# Patient Record
Sex: Male | Born: 1955 | Race: White | Hispanic: No | Smoking: Current every day smoker
Health system: Southern US, Community
[De-identification: ages and names within clinical notes are randomized; demographics above are authoritative.]

## PROBLEM LIST (undated history)

## (undated) DIAGNOSIS — I10 Essential (primary) hypertension: Secondary | ICD-10-CM

## (undated) DIAGNOSIS — J45909 Unspecified asthma, uncomplicated: Secondary | ICD-10-CM

## (undated) DIAGNOSIS — E119 Type 2 diabetes mellitus without complications: Secondary | ICD-10-CM

---

## 1999-05-19 ENCOUNTER — Ambulatory Visit (HOSPITAL_BASED_OUTPATIENT_CLINIC_OR_DEPARTMENT_OTHER): Admission: RE | Admit: 1999-05-19 | Discharge: 1999-05-19 | Payer: Self-pay | Admitting: Orthopedic Surgery

## 2001-04-24 ENCOUNTER — Encounter: Admission: RE | Admit: 2001-04-24 | Discharge: 2001-04-24 | Payer: Self-pay | Admitting: Orthopedic Surgery

## 2001-04-24 ENCOUNTER — Encounter: Payer: Self-pay | Admitting: Orthopedic Surgery

## 2001-05-08 ENCOUNTER — Encounter: Payer: Self-pay | Admitting: Orthopedic Surgery

## 2001-05-08 ENCOUNTER — Encounter: Admission: RE | Admit: 2001-05-08 | Discharge: 2001-05-08 | Payer: Self-pay | Admitting: Orthopedic Surgery

## 2001-06-17 ENCOUNTER — Emergency Department (HOSPITAL_COMMUNITY): Admission: EM | Admit: 2001-06-17 | Discharge: 2001-06-17 | Payer: Self-pay | Admitting: Emergency Medicine

## 2001-06-17 ENCOUNTER — Encounter: Payer: Self-pay | Admitting: Emergency Medicine

## 2001-07-04 ENCOUNTER — Emergency Department (HOSPITAL_COMMUNITY): Admission: EM | Admit: 2001-07-04 | Discharge: 2001-07-04 | Payer: Self-pay | Admitting: Emergency Medicine

## 2003-07-13 ENCOUNTER — Emergency Department (HOSPITAL_COMMUNITY): Admission: EM | Admit: 2003-07-13 | Discharge: 2003-07-13 | Payer: Self-pay | Admitting: Emergency Medicine

## 2003-07-13 ENCOUNTER — Encounter: Payer: Self-pay | Admitting: Emergency Medicine

## 2004-08-04 ENCOUNTER — Inpatient Hospital Stay (HOSPITAL_COMMUNITY): Admission: EM | Admit: 2004-08-04 | Discharge: 2004-08-04 | Payer: Self-pay | Admitting: Emergency Medicine

## 2004-10-28 ENCOUNTER — Emergency Department (HOSPITAL_COMMUNITY): Admission: EM | Admit: 2004-10-28 | Discharge: 2004-10-28 | Payer: Self-pay | Admitting: Emergency Medicine

## 2006-03-04 ENCOUNTER — Emergency Department (HOSPITAL_COMMUNITY): Admission: EM | Admit: 2006-03-04 | Discharge: 2006-03-04 | Payer: Self-pay | Admitting: Emergency Medicine

## 2010-05-14 ENCOUNTER — Encounter: Admission: RE | Admit: 2010-05-14 | Discharge: 2010-05-14 | Payer: Self-pay | Admitting: General Surgery

## 2010-05-18 ENCOUNTER — Ambulatory Visit (HOSPITAL_BASED_OUTPATIENT_CLINIC_OR_DEPARTMENT_OTHER): Admission: RE | Admit: 2010-05-18 | Discharge: 2010-05-18 | Payer: Self-pay | Admitting: General Surgery

## 2010-12-10 LAB — BASIC METABOLIC PANEL
BUN: 12 mg/dL (ref 6–23)
CO2: 27 mEq/L (ref 19–32)
Chloride: 102 mEq/L (ref 96–112)
Potassium: 4.4 mEq/L (ref 3.5–5.1)

## 2010-12-10 LAB — GLUCOSE, CAPILLARY: Glucose-Capillary: 272 mg/dL — ABNORMAL HIGH (ref 70–99)

## 2010-12-10 LAB — CBC
HCT: 49.3 % (ref 39.0–52.0)
MCH: 31.4 pg (ref 26.0–34.0)
MCV: 91.1 fL (ref 78.0–100.0)
RBC: 5.41 MIL/uL (ref 4.22–5.81)
RDW: 13 % (ref 11.5–15.5)
WBC: 7.3 10*3/uL (ref 4.0–10.5)

## 2010-12-10 LAB — DIFFERENTIAL
Eosinophils Relative: 2 % (ref 0–5)
Lymphocytes Relative: 23 % (ref 12–46)
Lymphs Abs: 1.7 10*3/uL (ref 0.7–4.0)
Monocytes Absolute: 1 10*3/uL (ref 0.1–1.0)

## 2011-02-12 NOTE — H&P (Signed)
Justin Snow, Justin Snow            ACCOUNT NO.:  [id number]   MEDICAL RECORD NO.:  [id number]          PATIENT TYPE:  EMS   LOCATION:  ED                           FACILITY:  [facility]   PHYSICIAN:  Danae Chen, M.D.DATE OF BIRTH:  [birth year]   DATE OF ADMISSION:  08/04/2004  DATE OF DISCHARGE:                                HISTORY & PHYSICAL   The patient is a primary unassigned patient.   CHIEF COMPLAINT:  Chest discomfort.   HISTORY OF PRESENT ILLNESS:  The patient is a 55 year old white male who  presents to the emergency room with a two-day history of chest discomfort  which he said he began at rest at home. It was not associated with nausea,  vomiting, or diaphoresis, but he was short of breath at the time. The pain  was dull in character and did radiate across his chest to his back, but not  to his right arm. It was not worsened with activity that he is aware of. He  has had some similar episodes in the past, but they have been associated  with bouts of bronchitis and cough. Of course this was different than  before. He has no significant past medical history, although he reports he  has had hypertension in the past, but has not been treated.  His primary  problem is low back pain secondary to degenerative disk disease of his  lumbar spine. 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   PAST SURGICAL HISTORY:  Tonsillectomy.   MEDICATIONS:  Except for over-the-counter ibuprofen.   ALLERGIES:  No known drug allergies.   SOCIAL HISTORY:  He is divorced, has three children. He is an 80-pack-year  smoker. He has used alcohol in the past, but denies current alcohol use.   FAMILY HISTORY:  He denies any family history of early onset coronary artery  disease. Both father and grandfather lived to their 47s. No history of  diabetes that he is aware of.   OTHER PERTINENT CARDIAC RISK FACTORS:   He does not have a history of high  cholesterol to his knowledge or diabetes. As reported he is not being  currently treated for hypertension. He does not exercise on a regular basis.   REVIEW OF SYSTEMS:  Positive for chest discomfort and shortness of breath.  Negative for nausea, vomiting, diuresis.  Negative for hemoptysis,  hematochezia, or melena. No recent weight gain or weight loss. Positive for  headache and sinus pressure.   PHYSICAL EXAMINATION:  VITAL SIGNS: Afebrile, temperature 96.9, blood  pressure 165/105, pulse 88, respirations 18. O2 saturation 99% on room air.  GENERAL: He is in no acute distress, resting comfortably in his bed.  HEENT: Pupils equal and reactive. Head is normocephalic and atraumatic.  Oropharynx is clear. He does have some mild sinus tenderness to palpation  over his frontal and maxillary sinus.  NECK: Supple with no JVD or bruits appreciated.  LUNGS: Clear to auscultation bilaterally.  HEART: Rate is regular. No murmurs.  ABDOMEN: Slightly obese, soft,  and nontender. No rebound or guarding.  EXTREMITIES: He has no peripheral edema.  NEUROLOGIC: No focal neurologic deficits. Alert and oriented.   LABORATORY DATA:  White count 8500, hemoglobin 15.7, platelet count 173,000.  Sodium 134, potassium 3.8, chloride 99, CO2 28, glucose 219, BUN 8,  creatinine 1.1, calcium 8.5, total protein 6.5, albumin 3.5.  AST 25, ALT  27, alkaline phosphatase 125, total bilirubin 0.6. Initial CK-MB is 55.3 and  1.1, troponin less than 0.05.   Chest x-ray shows no active cardiopulmonary disease, minimal chronic  bronchitic changes. EKG shows a normal sinus rhythm with normal axis, no ST  segment elevation or depression.   IMPRESSION:  A 55 year old gentleman with cardiac risk factors that include  his age, tobacco use, and physical inactivity. He presents with atypical  chest discomfort, but given his risk factors it would be advisable to admit  him to telemetry and  rule him out with serial cardiac enzymes. In addition,  his glucose level of 219 suggests that he may  have glucose intolerance and  we will check hemoglobin A1C as well for him. At this time will start him on  a beta blocker, ACE inhibitor, and aspirin with Protonix for GI prophylaxis,  IV morphine for pain, and Allegra D for his sinus discomfort. Will consult  cardiology for probable stress EKG test in the a.m. As the patient is  currently pain free we can discontinue the nitroglycerin and I will follow  up his serial enzymes.      RLK/MEDQ  D:  08/04/2004  T:  08/04/2004  Job:  [id number]

## 2019-10-18 ENCOUNTER — Ambulatory Visit (INDEPENDENT_AMBULATORY_CARE_PROVIDER_SITE_OTHER): Payer: Medicare Other

## 2019-10-18 ENCOUNTER — Other Ambulatory Visit: Payer: Self-pay

## 2019-10-18 ENCOUNTER — Encounter (HOSPITAL_COMMUNITY): Payer: Self-pay

## 2019-10-18 ENCOUNTER — Ambulatory Visit (HOSPITAL_COMMUNITY)
Admission: EM | Admit: 2019-10-18 | Discharge: 2019-10-18 | Disposition: A | Discharge status: Home / Self Care | Payer: Medicare Other | Attending: Family Medicine | Admitting: Family Medicine

## 2019-10-18 DIAGNOSIS — Z8249 Family history of ischemic heart disease and other diseases of the circulatory system: Secondary | ICD-10-CM | POA: Insufficient documentation

## 2019-10-18 DIAGNOSIS — E0865 Diabetes mellitus due to underlying condition with hyperglycemia: Secondary | ICD-10-CM | POA: Insufficient documentation

## 2019-10-18 DIAGNOSIS — R0602 Shortness of breath: Secondary | ICD-10-CM | POA: Insufficient documentation

## 2019-10-18 DIAGNOSIS — R600 Localized edema: Secondary | ICD-10-CM | POA: Diagnosis not present

## 2019-10-18 DIAGNOSIS — F17210 Nicotine dependence, cigarettes, uncomplicated: Secondary | ICD-10-CM | POA: Insufficient documentation

## 2019-10-18 DIAGNOSIS — R030 Elevated blood-pressure reading, without diagnosis of hypertension: Secondary | ICD-10-CM | POA: Insufficient documentation

## 2019-10-18 DIAGNOSIS — E1165 Type 2 diabetes mellitus with hyperglycemia: Secondary | ICD-10-CM

## 2019-10-18 DIAGNOSIS — Z20822 Contact with and (suspected) exposure to covid-19: Secondary | ICD-10-CM | POA: Diagnosis not present

## 2019-10-18 DIAGNOSIS — Z794 Long term (current) use of insulin: Secondary | ICD-10-CM | POA: Insufficient documentation

## 2019-10-18 DIAGNOSIS — J45909 Unspecified asthma, uncomplicated: Secondary | ICD-10-CM | POA: Insufficient documentation

## 2019-10-18 HISTORY — DX: Unspecified asthma, uncomplicated: J45.909

## 2019-10-18 HISTORY — DX: Type 2 diabetes mellitus without complications: E11.9

## 2019-10-18 LAB — CBG MONITORING, ED: Glucose-Capillary: 279 mg/dL — ABNORMAL HIGH (ref 70–99)

## 2019-10-18 LAB — BASIC METABOLIC PANEL
Anion gap: 11 (ref 5–15)
BUN: 20 mg/dL (ref 8–23)
CO2: 22 mmol/L (ref 22–32)
Calcium: 9 mg/dL (ref 8.9–10.3)
Chloride: 108 mmol/L (ref 98–111)
Creatinine, Ser: 1.23 mg/dL (ref 0.61–1.24)
GFR calc Af Amer: >60 mL/min (ref >60)
GFR calc non Af Amer: >60 mL/min (ref >60)
Glucose, Bld: 305 mg/dL — ABNORMAL HIGH (ref 70–99)
Potassium: 3.3 mmol/L — ABNORMAL LOW (ref 3.5–5.1)
Sodium: 141 mmol/L (ref 135–145)

## 2019-10-18 LAB — GLUCOSE, CAPILLARY: Glucose-Capillary: 279 mg/dL — ABNORMAL HIGH (ref 70–99)

## 2019-10-18 MED ORDER — "INSULIN SYRINGE 30G X 1/2"" 0.5 ML MISC": 0 refills | Status: AC

## 2019-10-18 MED ORDER — FUROSEMIDE 40 MG PO TABS: 40.0000 mg | ORAL_TABLET | Freq: Every day | ORAL | 0 refills | Status: DC

## 2019-10-18 NOTE — ED Provider Notes (Signed)
[facility]   [id number] 10/18/19 Arrival Time: 1050  ASSESSMENT & PLAN:  1. SOB (shortness of breath)   2. Bilateral lower extremity edema   3. 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 Elevated blood pressure reading without diagnosis of hypertension     Meds ordered this encounter  Medications  . Insulin Syringe-Needle U-100 (INSULIN SYRINGE .5CC/30GX1/2") 30G X 1/2" 0.5 ML MISC    Sig: Use as directed with insulin.    Dispense:  100 each    Refill:  0  . furosemide (LASIX) 40 MG tablet    Sig: Take 1 tablet (40 mg total) by mouth daily.    Dispense:  7 tablet    Refill:  0    Suspect CHF. Stressed importance of establishing care with PCP. He would like trial of Lasix and outpt management but agrees to proceed to the ED should his symptoms worsen in any way. Is currently living with relatives who can drive him as needed; 315 if acute worsening. Discussed my worry over persistent tachypnea. He voices understanding and wishes to proceed with current plan. Blood sugar elevated; will start back on insulin when he gets syringes.  BMP pending. Will notify of any significant abnormalities.   Follow-up Information    Schedule an appointment as soon as possible for a visit  with [facility].   Contact information: [address] [place] [address] [phone]       Schedule an appointment as soon as possible for a visit  with Primary Care at [facility].   Specialty: Family Medicine Contact information: [address] [place] [address] [phone]       Schedule an appointment as soon as possible for a visit  with [facility].   Contact information: [address] [place] [address] [phone]            Discharge Instructions     You have been seen at  the [facility] today for shortness of breath and some fluid on your lungs. Begin taking the fluid pill prescribed (Lasix).  Your evaluation today was not suggestive of any emergent condition requiring hospitalization at this time. However, some medical problems make take more time to appear. Therefore, it's very important that you pay attention to any new symptoms or worsening of your current condition.  Please proceed directly to the Emergency Department immediately should you feel worse in any way.     Reviewed expectations re: course of current medical issues. Questions answered. Outlined signs and symptoms indicating need for more acute intervention. Patient verbalized understanding. After Visit Summary given.   SUBJECTIVE: History from: patient. Justin Snow is a 64 y.o. diabetic male who presents with worsening SOB x7 days. Takes albuterol inhaler for relief and takes it more frequently now. He's been baseline SOB for 1 year; worse with exertion.  History of diabetes and lower extremity edema. Hasn't had insulin syringes and so has not been taking insulin x7 days. Has not been taking lasix as previously prescribed. Endorses slight pleuritic chest pain, intermittent non-productive cough, fatigue, DOE, and lightheadedness; all sporadic except for persistent fatigue. Denies recent sick contacts, URI symptoms, fever/chills, or abdominal pain. Pt does smoke cigarettes daily and has for 40+yrs. 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 No PCP; has been incarcerated until recently. No current CP  reported. Symptoms are worse with exertion.  Increased blood pressure noted today. Reports that he has not been treated for hypertension in the past. He reports no chest pain on exertion, no orthostatic dizziness or lightheadedness, no palpitations and no intermittent claudication symptoms.   OBJECTIVE:  Vitals:   10/18/19 1135  BP: (!) 178/85  Pulse: 83  Resp: (!) 26    Temp: 97.8 F (36.6 C)  TempSrc: Oral  SpO2: 96%    General appearance: alert; appears fatigued Eyes: PERRLA; EOMI; conjunctiva normal HENT: normocephalic; atraumatic; oral mucosa normal Neck: supple with FROM Lungs: labored breathing, tachypneic,overall lungs clear to auscultation bilaterally without wheezing but question slight rales at bases Heart: regular rate and rhythm Abdomen: soft, non-tender Back: no CVA tenderness Extremities: 2+ bilateral pitting lower extremity edema Skin: warm and dry Neurologic: normal gait Psychological: alert and cooperative; normal mood and affect  Labs: Results for orders placed or performed during the hospital encounter of 10/18/19  Glucose, capillary  Result Value Ref Range   Glucose-Capillary 279 (H) 70 - 99 mg/dL  CBG monitoring, ED  Result Value Ref Range   Glucose-Capillary 279 (H) 70 - 99 mg/dL   Labs Reviewed  GLUCOSE, CAPILLARY - Abnormal; Notable for the following components:      Result Value   Glucose-Capillary 279 (*)    All other components within normal limits  CBG MONITORING, ED - Abnormal; Notable for the following components:   Glucose-Capillary 279 (*)    All other components within normal limits  NOVEL CORONAVIRUS, NAA (HOSP ORDER, SEND-OUT TO REF LAB; TAT 18-24 HRS)  CBG MONITORING, ED    Imaging: DG Chest 2 View  Result Date: 10/18/2019 CLINICAL DATA:  Shortness of breath. Wheezing. EXAM: CHEST - 2 VIEW COMPARISON:  05/14/2010 FINDINGS: The patient has new bilateral mild interstitial pulmonary edema with Kerley B-lines at the right lung base. Heart size and pulmonary vascularity are normal. Minimal blunting of the posterior costophrenic angles suggesting tiny effusions. No acute bone abnormality. IMPRESSION: New mild bilateral interstitial pulmonary edema. Probable tiny bilateral effusions. Electronically Signed   By: Lorriane Shire M.D.   On: 10/18/2019 12:23    NKDA reported.  Past Medical History:  Diagnosis  Date  . Asthma   . Diabetes mellitus without complication [facility])    Social History   Socioeconomic History  . Marital status: Single    Spouse name: Not on file  . Number of children: Not on file  . Years of education: Not on file  . Highest education level: Not on file  Occupational History  . Not on file  Tobacco Use  . Smoking status: Current Every Day Smoker    Packs/day: 0.50    Types: Cigarettes  . Smokeless tobacco: Never Used  Substance and Sexual Activity  . Alcohol use: Not Currently  . Drug use: Not Currently  . Sexual activity: Not on file  Other Topics Concern  . Not on file  Social History Narrative  . Not on file   Social Determinants of Health   Financial Resource Strain:   . Difficulty of Paying Living Expenses: Not on file  Food Insecurity:   . Worried About Charity fundraiser in the Last Year: Not on file  . Ran Out of Food in the Last Year: Not on file  Transportation Needs:   . Lack of Transportation (Medical): Not on file  . Lack of Transportation (Non-Medical): Not on file  Physical Activity:   . Days of Exercise  per Week: Not on file  . Minutes of Exercise per Session: Not on file  Stress:   . Feeling of Stress : Not on file  Social Connections:   . Frequency of Communication with Friends and Family: Not on file  . Frequency of Social Gatherings with Friends and Family: Not on file  . Attends Religious Services: Not on file  . Active Member of Clubs or Organizations: Not on file  . Attends Banker Meetings: Not on file  . Marital Status: Not on file  Intimate Partner Violence:   . Fear of Current or Ex-Partner: Not on file  . Emotionally Abused: Not on file  . Physically Abused: Not on file  . Sexually Abused: Not on file   FH: HTN  History reviewed. No pertinent surgical history.   Mardella Layman, MD 10/18/19 1331

## 2019-10-18 NOTE — ED Notes (Signed)
Bed: UC08 Expected date:  Expected time:  Means of arrival:  Comments: [place]

## 2019-10-18 NOTE — ED Triage Notes (Signed)
Pt c/o SOB increased this past week from baseline. Released from incarceration last Wednesday and only has inhalers that he states are not helping his SOB well enough. Also states he was unable to get insulin syringes for his Rx since release.  CBG 279 Also c/o intermittent CP that increases with movement, breathing and when SOB; mild dizziness. Denies diaphoresis, n/v.   SaO2 89% upon first arrival to room, once rested increased to 98-100%.  Also c/o HA, cough.. SOB noted with talking.

## 2019-10-18 NOTE — Discharge Instructions (Addendum)
You have been seen at the [facility] today for shortness of breath and some fluid on your lungs. Begin taking the fluid pill prescribed (Lasix). It is very important that you pay attention to any new symptoms or worsening of your current condition.  Please proceed directly to the Emergency Department immediately should you feel worse in any way.

## 2019-10-20 ENCOUNTER — Other Ambulatory Visit: Payer: Self-pay

## 2019-10-20 ENCOUNTER — Encounter (HOSPITAL_COMMUNITY): Payer: Self-pay

## 2019-10-20 ENCOUNTER — Emergency Department (HOSPITAL_COMMUNITY)
Admission: EM | Admit: 2019-10-20 | Discharge: 2019-10-20 | Discharge status: Against Medical Advice | Payer: Medicare Other | Attending: Emergency Medicine | Admitting: Emergency Medicine

## 2019-10-20 ENCOUNTER — Ambulatory Visit (INDEPENDENT_AMBULATORY_CARE_PROVIDER_SITE_OTHER)
Admission: EM | Admit: 2019-10-20 | Discharge: 2019-10-20 | Disposition: A | Discharge status: Nursing Home (Includes ICF) | Payer: Medicare Other | Source: Home / Self Care

## 2019-10-20 ENCOUNTER — Emergency Department (HOSPITAL_COMMUNITY): Payer: Medicare Other

## 2019-10-20 DIAGNOSIS — R0902 Hypoxemia: Secondary | ICD-10-CM | POA: Diagnosis not present

## 2019-10-20 DIAGNOSIS — Z5320 Procedure and treatment not carried out because of patient's decision for unspecified reasons: Secondary | ICD-10-CM | POA: Insufficient documentation

## 2019-10-20 DIAGNOSIS — J449 Chronic obstructive pulmonary disease, unspecified: Secondary | ICD-10-CM

## 2019-10-20 DIAGNOSIS — E119 Type 2 diabetes mellitus without complications: Secondary | ICD-10-CM | POA: Insufficient documentation

## 2019-10-20 DIAGNOSIS — R600 Localized edema: Secondary | ICD-10-CM | POA: Diagnosis not present

## 2019-10-20 DIAGNOSIS — F17200 Nicotine dependence, unspecified, uncomplicated: Secondary | ICD-10-CM

## 2019-10-20 DIAGNOSIS — F17210 Nicotine dependence, cigarettes, uncomplicated: Secondary | ICD-10-CM | POA: Diagnosis not present

## 2019-10-20 DIAGNOSIS — R0602 Shortness of breath: Secondary | ICD-10-CM | POA: Diagnosis present

## 2019-10-20 DIAGNOSIS — Z8709 Personal history of other diseases of the respiratory system: Secondary | ICD-10-CM | POA: Insufficient documentation

## 2019-10-20 LAB — NOVEL CORONAVIRUS, NAA (HOSP ORDER, SEND-OUT TO REF LAB; TAT 18-24 HRS): SARS-CoV-2, NAA: NOT DETECTED

## 2019-10-20 LAB — BASIC METABOLIC PANEL
Anion gap: 10 (ref 5–15)
BUN: 23 mg/dL (ref 8–23)
CO2: 24 mmol/L (ref 22–32)
Calcium: 9 mg/dL (ref 8.9–10.3)
Chloride: 107 mmol/L (ref 98–111)
Creatinine, Ser: 1.28 mg/dL — ABNORMAL HIGH (ref 0.61–1.24)
GFR calc Af Amer: >60 mL/min (ref >60)
GFR calc non Af Amer: 58 mL/min — ABNORMAL LOW (ref >60)
Glucose, Bld: 257 mg/dL — ABNORMAL HIGH (ref 70–99)
Potassium: 3.5 mmol/L (ref 3.5–5.1)
Sodium: 141 mmol/L (ref 135–145)

## 2019-10-20 LAB — CBC WITH DIFFERENTIAL/PLATELET
Abs Immature Granulocytes: 0.07 10*3/uL (ref 0.00–0.07)
Basophils Absolute: 0.1 10*3/uL (ref 0.0–0.1)
Basophils Relative: 1 %
Eosinophils Absolute: 0.4 10*3/uL (ref 0.0–0.5)
Eosinophils Relative: 4 %
HCT: 38.1 % — ABNORMAL LOW (ref 39.0–52.0)
Hemoglobin: 12.3 g/dL — ABNORMAL LOW (ref 13.0–17.0)
Immature Granulocytes: 1 %
Lymphocytes Relative: 17 %
Lymphs Abs: 1.8 10*3/uL (ref 0.7–4.0)
MCH: 28.9 pg (ref 26.0–34.0)
MCHC: 32.3 g/dL (ref 30.0–36.0)
MCV: 89.6 fL (ref 80.0–100.0)
Monocytes Absolute: 0.9 10*3/uL (ref 0.1–1.0)
Monocytes Relative: 9 %
Neutro Abs: 7.1 10*3/uL (ref 1.7–7.7)
Neutrophils Relative %: 68 %
Platelets: 181 10*3/uL (ref 150–400)
RBC: 4.25 MIL/uL (ref 4.22–5.81)
RDW: 13.4 % (ref 11.5–15.5)
WBC: 10.4 10*3/uL (ref 4.0–10.5)
nRBC: 0 % (ref 0.0–0.2)

## 2019-10-20 LAB — BRAIN NATRIURETIC PEPTIDE: B Natriuretic Peptide: 94.5 pg/mL (ref 0.0–100.0)

## 2019-10-20 MED ORDER — ACETAMINOPHEN-CODEINE #3 300-30 MG PO TABS: 2.0000 | ORAL_TABLET | Freq: Once | ORAL | Status: DC

## 2019-10-20 MED ORDER — ALBUTEROL SULFATE HFA 108 (90 BASE) MCG/ACT IN AERS
8.0000 | INHALATION_SPRAY | Freq: Once | RESPIRATORY_TRACT | Status: AC
  Administered 2019-10-20: 8 via RESPIRATORY_TRACT
  Filled 2019-10-20: qty 6.7

## 2019-10-20 MED ORDER — AMOXICILLIN 500 MG PO CAPS: 500.0000 mg | ORAL_CAPSULE | Freq: Two times a day (BID) | ORAL | 0 refills | Status: AC

## 2019-10-20 MED ORDER — ACETAMINOPHEN 325 MG PO TABS
650.0000 mg | ORAL_TABLET | Freq: Once | ORAL | Status: AC
  Administered 2019-10-20: 650 mg via ORAL
  Filled 2019-10-20: qty 2

## 2019-10-20 MED ORDER — AZITHROMYCIN 250 MG PO TABS: 250.0000 mg | ORAL_TABLET | Freq: Every day | ORAL | 0 refills | Status: DC

## 2019-10-20 MED ORDER — IPRATROPIUM BROMIDE HFA 17 MCG/ACT IN AERS
4.0000 | INHALATION_SPRAY | Freq: Once | RESPIRATORY_TRACT | Status: AC
Start: 2019-10-20 — End: 2019-10-20
  Administered 2019-10-20: 4 via RESPIRATORY_TRACT
  Filled 2019-10-20: qty 12.9

## 2019-10-20 MED ORDER — AEROCHAMBER PLUS FLO-VU MEDIUM MISC
1.0000 | Freq: Once | Status: DC
  Filled 2019-10-20: qty 1

## 2019-10-20 MED ORDER — METHYLPREDNISOLONE SODIUM SUCC 125 MG IJ SOLR
125.0000 mg | Freq: Once | INTRAMUSCULAR | Status: AC
  Administered 2019-10-20: 14:00:00 125 mg via INTRAVENOUS
  Filled 2019-10-20: qty 2

## 2019-10-20 NOTE — ED Provider Notes (Signed)
[place]    CSN: [id number] Arrival date & time: 10/20/19  1001      History   Chief Complaint Chief Complaint  Patient presents with  . Shortness of Breath    HPI Justin Snow is a 64 y.o. male.   HPI  Patient presents to urgent care for shortness of breath. He is tachypneic and oxygen saturation 86%. Oxygen saturation improved with 2 liters of oxygen to 93%. Patient seen here two days ago with the same complaint and chest x-ray noted the presence pulmonary edema. Patient denies known CHF. He was placed on Lasix. Reports taken one dose of lasix. No significant improvement of shortness of breath. He also reports increased use of inhalers and now is completely out. He was incarcerated up until 1 week ago. COVID-19 test pending. He suffers from poorly controlled diabetes with hyperglycemia x 2 days ago per BMP.  Past Medical History:  Diagnosis Date  . Asthma   . Diabetes mellitus without complication ([place])     There are no problems to display for this patient.   History reviewed. No pertinent surgical history.     Home Medications    Prior to Admission medications   Medication Sig Start Date End Date Taking? Authorizing Provider  albuterol (VENTOLIN HFA) 108 (90 Base) MCG/ACT inhaler Inhale into the lungs every 6 (six) hours as needed for wheezing or shortness of breath.    [provider]  fluticasone-salmeterol (ADVAIR HFA) 115-21 MCG/ACT inhaler Inhale 2 puffs into the lungs 2 (two) times daily.    [provider]  furosemide (LASIX) 40 MG tablet Take 1 tablet (40 mg total) by mouth daily. 10/18/19   Vanessa Kick, MD  Insulin Syringe-Needle U-100 (INSULIN SYRINGE .5CC/30GX1/2") 30G X 1/2" 0.5 ML MISC Use as directed with insulin. 10/18/19   Vanessa Kick, MD    Family History History reviewed. No pertinent family history.  Social History Social History   Tobacco Use  . Smoking status: Current Every Day Smoker    Packs/day:  0.50    Types: Cigarettes  . Smokeless tobacco: Never Used  Substance Use Topics  . Alcohol use: Not Currently  . Drug use: Not Currently     Allergies   Patient has no allergy information on record.   Review of Systems Review of Systems Pertinent negatives listed in HPI  Physical Exam Triage Vital Signs ED Triage Vitals  Enc Vitals Group     BP 10/20/19 1015 (!) 154/127     Pulse Rate 10/20/19 1015 72     Resp 10/20/19 1015 (!) 28     Temp 10/20/19 1015 97.8 F (36.6 C)     Temp src --      SpO2 10/20/19 1015 (!) 86 %     Weight --      Height --      Head Circumference --      Peak Flow --      Pain Score 10/20/19 1018 0     Pain Loc --      Pain Edu? --      Excl. in [place]? --    No data found.  Updated Vital Signs BP (!) 154/127 (BP Location: Right Arm)   Pulse 72   Temp 97.8 F (36.6 C)   Resp (!) 28   SpO2 (!) 86%   Visual Acuity Right Eye Distance:   Left Eye Distance:   Bilateral Distance:    Right Eye Near:   Left  Eye Near:    Bilateral Near:     Physical Exam Constitutional:      General: He is in acute distress.     Appearance: He is obese. He is ill-appearing.  HENT:     Head: Normocephalic.  Cardiovascular:     Rate and Rhythm: Normal rate and regular rhythm.  Pulmonary:     Effort: Tachypnea, accessory muscle usage and respiratory distress present.     Breath sounds: Examination of the right-upper field reveals rales. Examination of the left-upper field reveals rales. Examination of the right-lower field reveals rales. Examination of the left-lower field reveals rales. Rales present.  Abdominal:     General: There is distension.  Skin:    General: Skin is dry.      UC Treatments / Results  Labs (all labs ordered are listed, but only abnormal results are displayed) Labs Reviewed - No data to display  EKG   Radiology DG Chest 2 View  Result Date: 10/18/2019 CLINICAL DATA:  Shortness of breath. Wheezing. EXAM: CHEST - 2  VIEW COMPARISON:  05/14/2010 FINDINGS: The patient has new bilateral mild interstitial pulmonary edema with Kerley B-lines at the right lung base. Heart size and pulmonary vascularity are normal. Minimal blunting of the posterior costophrenic angles suggesting tiny effusions. No acute bone abnormality. IMPRESSION: New mild bilateral interstitial pulmonary edema. Probable tiny bilateral effusions. Electronically Signed   By: Francene Boyers M.D.   On: 10/18/2019 12:23    Procedures Procedures (including critical care time)  Medications Ordered in UC Medications - No data to display  Initial Impression / Assessment and Plan / UC Course  I have reviewed the triage vital signs and the nursing notes.  Pertinent labs & imaging results that were available during my care of the patient were reviewed by me and considered in my medical decision making (see chart for details).   Patient transported via EMS to hospital due to acute hypoxia requiring oxygen supplementation.  Presentation concerning for possible underlying CHF vs COPD exacerbation, or COVID-19 infection.  Oxygen saturation stable now 93% with 2 liters of supplemental oxygen.  Final Clinical Impressions(s) / UC Diagnoses   Final diagnoses:  SOB (shortness of breath)  Hypoxia  Chronic obstructive pulmonary disease, unspecified COPD type [facility])   Discharge Instructions   None    ED Prescriptions    None     PDMP not reviewed this encounter.   Bing Neighbors, [place] 10/20/19 [phone]

## 2019-10-20 NOTE — ED Notes (Signed)
Gave pt meds and was evaluating SpO2. Pt on RA was currently at 88%, however would jump to 91% while sitting on the edge of the bed. Pt ambulated in room and SpO2 dropped again and stayed 87-88%. Once pt sat down and a few mins passed pt Sp)2 increased to 92% however did not stay there. Placed on 2 liters . Pt on 2 liters with 94% SpO2

## 2019-10-20 NOTE — ED Triage Notes (Signed)
Upon seeing pt name on reg board, this RN eval pt in waiting room with portable pulse ox. Resp rapid 28/min, shallow with noticeable SOB, SaO2 86% on RA. Jerrilyn Cairo NP notified of pt status and prior visit on Thursday for SOB. Jerrilyn Cairo advised pt to triage, O2 and call EMS for transport to ED.  Pt to triage O2 Sat up to 95% at rest but still tachypneic and placed on O2 via [place] at 2L and O2 Sat up 95%.   Jerrilyn Cairo NP in to triage for eval; advised pt need for EMS to ED 2/2 SOB.   18 ga SL placed L AC. Pt stable.

## 2019-10-20 NOTE — ED Triage Notes (Signed)
Pt arrives from urgent care via EMS with c/o SOB. Pt was seen on 10/18/19 and placed on lasix which pt states he takes. At Urgent care pt was 88% RA and placed on 2 liters with relief. When pt stood up SpO2 dropped back down and became SOB so EMS placed on 4 liters with SpO2 95% Generalized malaise. Was COVID tested on the 21st results pending at this time.   Pt current smoker CBG 347 174/90 HR 70 RR 28

## 2019-10-20 NOTE — Discharge Instructions (Signed)
Please read instructions below. Take the antibiotics as directed until they are gone. Use 1-2 puffs of the albuterol inhaler every 4-6 hours as needed. We recommended you stay in the hospital due to low oxygen levels, however you preferred to leave against medical advice. Please don't hesitate to return to the ER if your symptoms do not improve or worsen.

## 2019-10-20 NOTE — ED Notes (Signed)
EMS arrived for pt transport. Pt stable and transported via stretcher and O2

## 2019-10-20 NOTE — ED Notes (Signed)
Report called to ED charge nurse, Asher Muir. Affirmed understanding and expecting pt arrival.

## 2019-10-20 NOTE — ED Provider Notes (Signed)
[facility] EMERGENCY DEPARTMENT Provider Note   CSN: [id number] Arrival date & time: 10/20/19  1053     History Chief Complaint  Patient presents with  . Shortness of Breath    Justin Snow is a 64 y.o. male with past medical history of asthma, type 2 diabetes, presenting to the emergency department from urgent care with hypoxia.  Patient was evaluated 2 days ago at urgent care and discharged on Lasix with suspected COPD exacerbation versus new onset CHF.  He presented back to urgent care today because he states he ran out of his albuterol inhaler which was providing relief of symptoms.  He states his shortness of breath did not feel any worse than his usual shortness of breath that he treats with his albuterol.  He was noted to be hypoxic at 88% on room air at urgent care, and placed on 2 L nasal cannula.  On arrival to the ED, O2 saturation is 86% on room air, improved to 97% on 2 L.  He states he does not currently feel very short of breath on evaluation.  He reports a chronic cough as well as chronic bilateral lower extremity edema that is not worsening.  He states has been present for 3 to 4 years.  He denies orthopnea though reports some dyspnea on exertion.  He states he feels as though his chest is somewhat tight and he has a intermittent chronic cough.  No fevers or chills.  No known history of CHF.  No recent weight gain. Pt smokes tobacco daily. Per chart review, patient had Covid test on 10/18/2019 and is negative.  The history is provided by the patient and medical records.       Past Medical History:  Diagnosis Date  . Asthma   . Diabetes mellitus without complication ([place])     There are no problems to display for this patient.   No past surgical history on file.     No family history on file.  Social History   Tobacco Use  . Smoking status: Current Every Day Smoker    Packs/day: 0.50    Types: Cigarettes  . Smokeless tobacco: Never  Used  Substance Use Topics  . Alcohol use: Not Currently  . Drug use: Not Currently    Home Medications Prior to Admission medications   Medication Sig Start Date End Date Taking? Authorizing Provider  albuterol (VENTOLIN HFA) 108 (90 Base) MCG/ACT inhaler Inhale into the lungs every 6 (six) hours as needed for wheezing or shortness of breath.    [provider]  amoxicillin (AMOXIL) 500 MG capsule Take 1 capsule (500 mg total) by mouth 2 (two) times daily for 7 days. 10/20/19 10/27/19  , [place] N, PA-C  azithromycin (ZITHROMAX) 250 MG tablet Take 1 tablet (250 mg total) by mouth daily. Take first 2 tablets together, then 1 every day until finished. 10/20/19   , [place] N, PA-C  fluticasone-salmeterol (ADVAIR HFA) [phone] MCG/ACT inhaler Inhale 2 puffs into the lungs 2 (two) times daily.    [provider]  furosemide (LASIX) 40 MG tablet Take 1 tablet (40 mg total) by mouth daily. 10/18/19   Vanessa Kick, MD  Insulin Syringe-Needle U-100 (INSULIN SYRINGE .5CC/30GX1/2") 30G X 1/2" 0.5 ML MISC Use as directed with insulin. 10/18/19   Vanessa Kick, MD    Allergies    Patient has no allergy information on record.  Review of Systems   Review of Systems  All other systems reviewed  and are negative.   Physical Exam Updated Vital Signs BP (!) 125/98 (BP Location: Right Wrist)   Pulse 79   Resp 16   Ht 5\' 7"  (1.702 m)   Wt 136.1 kg   SpO2 92%   BMI 46.99 kg/m   Physical Exam Vitals and nursing note reviewed.  Constitutional:      Appearance: He is well-developed.  HENT:     Head: Normocephalic and atraumatic.  Eyes:     Conjunctiva/sclera: Conjunctivae normal.  Cardiovascular:     Rate and Rhythm: Normal rate and regular rhythm.  Pulmonary:     Effort: Tachypnea and accessory muscle usage present.     Comments: Faint wheezes b/l.  Abdominal:     Palpations: Abdomen is soft.  Musculoskeletal:     Comments: 3-4+ pitting edema BLE (pt reports is  chronic and unchanged)  Skin:    General: Skin is warm.  Neurological:     Mental Status: He is alert.  Psychiatric:        Behavior: Behavior normal.     ED Results / Procedures / Treatments   Labs (all labs ordered are listed, but only abnormal results are displayed) Labs Reviewed  BASIC METABOLIC PANEL - Abnormal; Notable for the following components:      Result Value   Glucose, Bld 257 (*)    Creatinine, Ser 1.28 (*)    GFR calc non Af Amer 58 (*)    All other components within normal limits  CBC WITH DIFFERENTIAL/PLATELET - Abnormal; Notable for the following components:   Hemoglobin 12.3 (*)    HCT 38.1 (*)    All other components within normal limits  BRAIN NATRIURETIC PEPTIDE    EKG EKG Interpretation  Date/Time:  Saturday October 20 2019 11:38:27 EST Ventricular Rate:  67 PR Interval:    QRS Duration: 87 QT Interval:  387 QTC Calculation: 409 R Axis:   -4 Text Interpretation: Sinus rhythm Low voltage, precordial leads Abnormal R-wave progression, early transition Borderline repolarization abnormality Artifact Abnormal ECG Confirmed by 04-09-1990 [phone]) on 10/20/2019 12:34:46 PM   Radiology DG Chest Port 1 View  Result Date: 10/20/2019 CLINICAL DATA:  Shortness of breath EXAM: PORTABLE CHEST 1 VIEW COMPARISON:  10/18/2019 FINDINGS: Bilateral interstitial thickening most severe at the lung bases. No pleural effusion or pneumothorax. Stable cardiomediastinal silhouette. No aggressive osseous lesion. IMPRESSION: 1. Bilateral interstitial thickening most severe at the lung bases. This may reflect persistent mild interstitial edema versus atypical pneumonia. Electronically Signed   By: 10/20/2019   On: 10/20/2019 11:49    Procedures Procedures (including critical care time)  Medications Ordered in ED Medications  AeroChamber Plus Flo-Vu Medium MISC 1 each (1 each Other Not Given 10/20/19 1333)  albuterol (VENTOLIN HFA) 108 (90 Base) MCG/ACT inhaler 8 puff  (8 puffs Inhalation Given 10/20/19 1330)  ipratropium (ATROVENT HFA) inhaler 4 puff (4 puffs Inhalation Given 10/20/19 1330)  methylPREDNISolone sodium succinate (SOLU-MEDROL) 125 mg/2 mL injection 125 mg (125 mg Intravenous Given 10/20/19 1330)  acetaminophen (TYLENOL) tablet 650 mg (650 mg Oral Given 10/20/19 1400)    ED Course  I have reviewed the triage vital signs and the nursing notes.  Pertinent labs & imaging results that were available during my care of the patient were reviewed by me and considered in my medical decision making (see chart for details).    MDM Rules/Calculators/A&P  Patient with history of chronic tobacco use, presenting with shortness of breath and hypoxia from urgent care.  He states his shortness of breath does not feel any worse than his baseline and improved with albuterol however he ran out of his albuterol this morning.  No new changes in chronic lower leg swelling, no recent weight gain or orthopnea.  He does however have dyspnea on exertion.  No fevers, chronic cough is reported.  Negative Covid test 2 days ago.  Upon arrival to urgent care he was noted to be hypoxic at 88% on room air placed on 2 L with improvement.  On arrival to the ED, he has increased work of breathing and tachypnea, faint wheezes bilaterally.  He however states he does not currently feel very short of breath.   Chest x-ray with interstitial thickening worse in the bases consistent with mild interstitial edema versus possible atypical pneumonia.  Labs with BNP within normal limits.  No leukocytosis.  Hyperglycemia with normal gap.  Patient given inhaled albuterol and ipratropium as well as Solu-Medrol with improvement in symptoms.  He was removed off O2 supplementation briefly and satting low to mid 90s however with ambulation had increased work of breathing and desatted to 87% on room air and put back on oxygen.  Recommend admission for likely COPD exacerbation, however  patient would prefer to treat outpatient.  Discussed at length reasons for admission and risks of going home at this time.  He verbalized understanding and would still prefer to treat outpatient.  Will prescribe azithromycin and amoxicillin to cover likely COPD exacerbation.  He is also given albuterol inhaler.  Encourage close outpatient follow-up and to return if symptoms persist or worsen.  Patient is mentating appropriately, no indication that pt cannot make his medical decisions. Dr Jeraldine Loots made aware of pt's decision to leave AMA.   We discussed the nature and purpose, risks and benefits, as well as, the alternatives of treatment. Time was given to allow the opportunity to ask questions and consider their options, and after the discussion, the patient decided to refuse the offerred treatment. The patient was informed that refusal could lead to, but was not limited to, death, permanent disability, or severe pain. If present, I asked the relatives or significant others to dissuade them without success. Prior to refusing, I determined that the patient had the capacity to make their decision and understood the consequences of that decision. After refusal, I made every reasonable opportunity to treat them to the best of my ability.  The patient was notified that they may return to the emergency department at any time for further treatment.    Final Clinical Impression(s) / ED Diagnoses Final diagnoses:  Hypoxia  SOB (shortness of breath)    Rx / DC Orders ED Discharge Orders         Ordered    amoxicillin (AMOXIL) 500 MG capsule  2 times daily     10/20/19 1451    azithromycin (ZITHROMAX) 250 MG tablet  Daily     10/20/19 1451           , [place] N, [place] 10/20/19 1513    Gerhard Munch, MD 10/21/19 [phone]

## 2020-06-27 ENCOUNTER — Ambulatory Visit: Payer: Medicare HMO | Admitting: Cardiology

## 2020-06-27 ENCOUNTER — Other Ambulatory Visit: Payer: Self-pay

## 2020-06-27 ENCOUNTER — Encounter: Payer: Self-pay | Admitting: Cardiology

## 2020-06-27 VITALS — BP 187/94 | HR 67 | Resp 16 | Ht 66.0 in | Wt 266.0 lb

## 2020-06-27 DIAGNOSIS — F17200 Nicotine dependence, unspecified, uncomplicated: Secondary | ICD-10-CM

## 2020-06-27 DIAGNOSIS — R600 Localized edema: Secondary | ICD-10-CM | POA: Insufficient documentation

## 2020-06-27 DIAGNOSIS — I10 Essential (primary) hypertension: Secondary | ICD-10-CM | POA: Insufficient documentation

## 2020-06-27 DIAGNOSIS — R0989 Other specified symptoms and signs involving the circulatory and respiratory systems: Secondary | ICD-10-CM | POA: Insufficient documentation

## 2020-06-27 NOTE — Progress Notes (Signed)
Patient referred by Timoteo Gaul, FNP for leg edema  Subjective:   Justin Snow, male    DOB: 05/06/1956, 64 y.o.   MRN: [id number]   Chief Complaint  Patient presents with  . Leg Swelling  . New Patient (Initial Visit)     HPI  64 y.o. Caucasian male with hypertension, uncontrolled type 2 diabetes mellitus, tobacco dependence, referred progression of edema.  Patient currently lives in a boarding house with roommates.  He has had longstanding hypertension and diabetes.  He reports longstanding progressive leg edema and exertional dyspnea.  Unfortunately, he continues to smoke daily and not interested in quitting at this time.  He is unsure about what medication he takes, although reports that he takes "all of them".   Past Medical History:  Diagnosis Date  . Asthma   . Diabetes mellitus without complication ([place])      History reviewed. No pertinent surgical history.   Social History   Tobacco Use  Smoking Status Current Every Day Smoker  . Packs/day: 0.50  . Types: Cigarettes  Smokeless Tobacco Never Used    Social History   Substance and Sexual Activity  Alcohol Use Not Currently     Family History  Problem Relation Age of Onset  . Heart disease Father      Current Outpatient Medications on File Prior to Visit  Medication Sig Dispense Refill  . albuterol (VENTOLIN HFA) 108 (90 Base) MCG/ACT inhaler Inhale into the lungs every 6 (six) hours as needed for wheezing or shortness of breath.    Marland Kitchen amLODipine (NORVASC) 10 MG tablet Take 10 mg by mouth daily.    Marland Kitchen atorvastatin (LIPITOR) 10 MG tablet Take 10 mg by mouth at bedtime.    . carvedilol (COREG) 6.25 MG tablet Take 6.25 mg by mouth 2 (two) times daily.    . DULoxetine (CYMBALTA) 30 MG capsule Take 30 mg by mouth 2 (two) times daily.    . fluticasone-salmeterol (ADVAIR HFA) 115-21 MCG/ACT inhaler Inhale 2 puffs into the lungs 2 (two) times daily.    . furosemide (LASIX) 40 MG tablet Take 1  tablet (40 mg total) by mouth daily. 7 tablet 0  . gabapentin (NEURONTIN) 400 MG capsule Take 400 mg by mouth 3 (three) times daily.    . Insulin Syringe-Needle U-100 (INSULIN SYRINGE .5CC/30GX1/2") 30G X 1/2" 0.5 ML MISC Use as directed with insulin. 100 each 0  . lisinopril (ZESTRIL) 40 MG tablet Take 40 mg by mouth daily.    . metFORMIN (GLUCOPHAGE) 1000 MG tablet Take 1,000 mg by mouth 2 (two) times daily.    Marland Kitchen omeprazole (PRILOSEC) 20 MG capsule Take 20 mg by mouth every morning.    . potassium chloride (MICRO-K) 10 MEQ CR capsule Take 10 mEq by mouth daily.     No current facility-administered medications on file prior to visit.    Cardiovascular and other pertinent studies:  EKG 06/27/2020:  Sinus rhythm 68 bpm Normal EKG  Recent labs: 10/20/2019: Glucose 257, BUN/Cr 23/1.28. EGFR 58. Na/K 141/3.5.  H/H 12/38. MCV 89. Platelets 181 BNP 75   Review of Systems  Cardiovascular: Positive for dyspnea on exertion and leg swelling. Negative for chest pain, palpitations and syncope.         Vitals:   06/27/20 1044  BP: (!) 187/94  Pulse: 67  Resp: 16  SpO2: 96%     Body mass index is 42.93 kg/m. Filed Weights   06/27/20 1044  Weight: 266 lb (  120.7 kg)     Objective:   Physical Exam Vitals and nursing note reviewed.  Constitutional:      General: He is not in acute distress. Neck:     Vascular: No JVD.  Cardiovascular:     Rate and Rhythm: Regular rhythm.     Pulses: Decreased pulses (Distal pulses difficult to appreciate given leg edema).          Femoral pulses are on the left side with bruit.    Heart sounds: Normal heart sounds. No murmur heard.   Pulmonary:     Effort: Pulmonary effort is normal.     Breath sounds: Normal breath sounds. No wheezing or rales.  Musculoskeletal:     Right lower leg: Edema (2+) present.     Left lower leg: Edema (2+) present.          Assessment & Recommendations:   64 y.o. Caucasian male with hypertension,  uncontrolled type 2 diabetes mellitus, tobacco dependence, referred progression of edema.  Hypertension, leg edema: Uncontrolled hypertension likely contributing to leg edema.  Systolic and diastolic heart failure also cannot be excluded.  Will obtain echocardiogram.  He is unsure of his medications today.  Therefore, have not made any change. Arranged for remote patient monitoring through pur pharmacist Manuela Schwartz.  Left c tobacco dependence arotid bruit: Recommend carotid ultrasound  Tobacco dependence: Tobacco cessation counseling:  - Currently smoking 1 packs/day   - Patient was informed of the dangers of tobacco abuse including stroke, cancer, and MI, as well as benefits of tobacco cessation. - Patient is not willing to quit at this time. - Approximately 5 mins were spent counseling patient cessation techniques. We discussed various methods to help quit smoking, including deciding on a date to quit, joining a support group, pharmacological agents.  - I will reassess his progress at the next follow-up visit   Thank you for referring the patient to [place]. Please feel free to contact with any questions.   Nigel Mormon, MD Pager: [phone] Office: [phone]

## 2020-07-01 ENCOUNTER — Other Ambulatory Visit: Payer: Self-pay

## 2020-07-01 ENCOUNTER — Ambulatory Visit: Payer: Medicare HMO

## 2020-07-01 DIAGNOSIS — I6522 Occlusion and stenosis of left carotid artery: Secondary | ICD-10-CM

## 2020-07-01 DIAGNOSIS — I10 Essential (primary) hypertension: Secondary | ICD-10-CM

## 2020-07-01 DIAGNOSIS — R0989 Other specified symptoms and signs involving the circulatory and respiratory systems: Secondary | ICD-10-CM

## 2020-07-02 ENCOUNTER — Other Ambulatory Visit: Payer: Self-pay | Admitting: Cardiology

## 2020-07-02 DIAGNOSIS — R0989 Other specified symptoms and signs involving the circulatory and respiratory systems: Secondary | ICD-10-CM

## 2020-07-02 DIAGNOSIS — I6522 Occlusion and stenosis of left carotid artery: Secondary | ICD-10-CM

## 2020-07-24 ENCOUNTER — Ambulatory Visit: Payer: Medicare HMO | Admitting: Cardiology

## 2020-07-31 ENCOUNTER — Ambulatory Visit: Payer: Medicare (Managed Care) | Admitting: Cardiology

## 2020-07-31 ENCOUNTER — Other Ambulatory Visit: Payer: Self-pay

## 2020-07-31 ENCOUNTER — Encounter: Payer: Self-pay | Admitting: Cardiology

## 2020-07-31 VITALS — BP 163/88 | HR 100 | Resp 16 | Ht 66.0 in | Wt 237.8 lb

## 2020-07-31 DIAGNOSIS — R600 Localized edema: Secondary | ICD-10-CM

## 2020-07-31 DIAGNOSIS — I10 Essential (primary) hypertension: Secondary | ICD-10-CM

## 2020-07-31 DIAGNOSIS — F17200 Nicotine dependence, unspecified, uncomplicated: Secondary | ICD-10-CM

## 2020-07-31 DIAGNOSIS — E782 Mixed hyperlipidemia: Secondary | ICD-10-CM | POA: Insufficient documentation

## 2020-07-31 MED ORDER — FUROSEMIDE 40 MG PO TABS: 40.0000 mg | ORAL_TABLET | Freq: Every day | ORAL | 1 refills | Status: DC

## 2020-07-31 MED ORDER — CARVEDILOL 6.25 MG PO TABS: 6.2500 mg | ORAL_TABLET | Freq: Two times a day (BID) | ORAL | 1 refills | Status: DC

## 2020-07-31 MED ORDER — LISINOPRIL 40 MG PO TABS: 40.0000 mg | ORAL_TABLET | Freq: Every day | ORAL | 1 refills | Status: DC

## 2020-07-31 MED ORDER — ATORVASTATIN CALCIUM 10 MG PO TABS: 10.0000 mg | ORAL_TABLET | Freq: Every day | ORAL | 1 refills | Status: DC

## 2020-07-31 MED ORDER — AMLODIPINE BESYLATE 10 MG PO TABS: 10.0000 mg | ORAL_TABLET | Freq: Every day | ORAL | 1 refills | Status: DC

## 2020-07-31 NOTE — Progress Notes (Signed)
Patient referred by Timoteo Gaul, FNP for leg edema  Subjective:   Justin Snow, male    DOB: August 31, 1956, 64 y.o.   MRN: [id number]   Chief Complaint  Patient presents with  . Leg Swelling  . Hypertension  . Follow-up    4 week     HPI  64 y.o. Caucasian male with hypertension, uncontrolled type 2 diabetes mellitus, tobacco dependence, leg edema  Patient remains to discuss his medications.  He does not have his medications with him, but reports that he takes them-although not regularly.  We verified the medications with his pharmacy.  Patient tells me that he is unable to drive due to previous DWIs.  He is allowed to ride a scooter under 40 miles an hour.  However, it is 40 minutes from here.  Therefore, he needs his 56 year old parents to drive him to his appointment here.   He remains nonchalant about his medications. He tells me that he is unsure if he will take his medications regularly.  Recent echocardiogram and carotid ultrasound was discussed with the patient, details below.  HPI consultation 06/2020: Patient currently lives in a boarding house with roommates.  He has had longstanding hypertension and diabetes.  He reports longstanding progressive leg edema and exertional dyspnea.  Unfortunately, he continues to smoke daily and not interested in quitting at this time.  He is unsure about what medication he takes, although reports that he takes "all of them".    Current Outpatient Medications on File Prior to Visit  Medication Sig Dispense Refill  . albuterol (VENTOLIN HFA) 108 (90 Base) MCG/ACT inhaler Inhale into the lungs every 6 (six) hours as needed for wheezing or shortness of breath.    Marland Kitchen amLODipine (NORVASC) 10 MG tablet Take 10 mg by mouth daily.    Marland Kitchen atorvastatin (LIPITOR) 10 MG tablet Take 10 mg by mouth at bedtime.    . carvedilol (COREG) 6.25 MG tablet Take 6.25 mg by mouth 2 (two) times daily.    . DULoxetine (CYMBALTA) 30 MG capsule Take 30 mg  by mouth 2 (two) times daily.    . fluticasone-salmeterol (ADVAIR HFA) 115-21 MCG/ACT inhaler Inhale 2 puffs into the lungs 2 (two) times daily.    . furosemide (LASIX) 40 MG tablet Take 1 tablet (40 mg total) by mouth daily. 7 tablet 0  . gabapentin (NEURONTIN) 400 MG capsule Take 400 mg by mouth 3 (three) times daily.    . Insulin Syringe-Needle U-100 (INSULIN SYRINGE .5CC/30GX1/2") 30G X 1/2" 0.5 ML MISC Use as directed with insulin. 100 each 0  . lisinopril (ZESTRIL) 40 MG tablet Take 40 mg by mouth daily.    . metFORMIN (GLUCOPHAGE) 1000 MG tablet Take 1,000 mg by mouth 2 (two) times daily.    Marland Kitchen omeprazole (PRILOSEC) 20 MG capsule Take 20 mg by mouth every morning.    . potassium chloride (MICRO-K) 10 MEQ CR capsule Take 10 mEq by mouth daily.     No current facility-administered medications on file prior to visit.    Cardiovascular and other pertinent studies:  Echocardiogram 07/01/2020:  Left ventricle cavity is normal in size. Moderate concentric hypertrophy  of the left ventricle. Normal global wall motion. Normal LV systolic  function with EF 57%. Doppler evidence of grade I (impaired) diastolic  dysfunction, normal LAP.  Trileaflet aortic valve with mild aortic valve leaflet calcification.  Trace aortic stenosis. Trace aortic regurgitation.  Mild (Grade I) mitral regurgitation.  Trace tricuspid regurgitation.  Estimated pulmonary artery systolic pressure 25 mmHg.  Carotid artery duplex 89/79/1504:  Peak systolic velocities in the right bifurcation, internal, external and  common carotid arteries are within normal limits.  Stenosis in the left internal carotid artery (16-49%), lower end of  spectrum with homogeneous plaque.  Antegrade right vertebral artery flow. Antegrade left vertebral artery  flow.  Follow up in one year is appropriate if clinically indicated.  EKG 06/27/2020:  Sinus rhythm 68 bpm Normal EKG  Recent labs: 10/20/2019: Glucose 257, BUN/Cr 23/1.28.  EGFR 58. Na/K 141/3.5.  H/H 12/38. MCV 89. Platelets 181 BNP 75   Review of Systems  Cardiovascular: Positive for dyspnea on exertion and leg swelling. Negative for chest pain, palpitations and syncope.         Vitals:   07/31/20 1427  BP: (!) 163/88  Pulse: 100  Resp: 16  SpO2: 94%     Body mass index is 38.38 kg/m. Filed Weights   07/31/20 1427  Weight: 237 lb 12.8 oz (107.9 kg)     Objective:   Physical Exam Vitals and nursing note reviewed.  Constitutional:      General: He is not in acute distress. Neck:     Vascular: No JVD.  Cardiovascular:     Rate and Rhythm: Regular rhythm.     Pulses: Decreased pulses (Distal pulses difficult to appreciate given leg edema).          Femoral pulses are on the left side with bruit.    Heart sounds: Normal heart sounds. No murmur heard.   Pulmonary:     Effort: Pulmonary effort is normal.     Breath sounds: Normal breath sounds. No wheezing or rales.  Musculoskeletal:     Right lower leg: Edema (1+) present.     Left lower leg: Edema (1+) present.          Assessment & Recommendations:   64 y.o. Caucasian male with hypertension, uncontrolled type 2 diabetes mellitus, tobacco dependence, leg edema.  Hypertension, leg edema: Uncontrolled hypertension likely contributing to leg edema.  Grade 1 diastolic dysfunction, mild MR on echocardiogram.  No other significant abnormalities noted.   Emphasized importance of medication compliance.  He has risk factors for CAD, he has not had any ongoing symptoms significant cardiac illness.  He has significant barriers to making to his office visits.  He may be best served by establishing care with a primary care physician close to home.  I have filled prescription for  Amlodipine, lisinopril, atorvastatin, and Lasix for next 3 months.    Nigel Mormon, MD Pager: [phone] Office: [phone]

## 2021-11-12 IMAGING — DX DG CHEST 2V
2 series · 2 of 2 positions shown · non-contrast
Comparison: 05/14/2010

CLINICAL DATA: Shortness of breath. Wheezing.

EXAM:
CHEST - 2 VIEW

[chest pa]
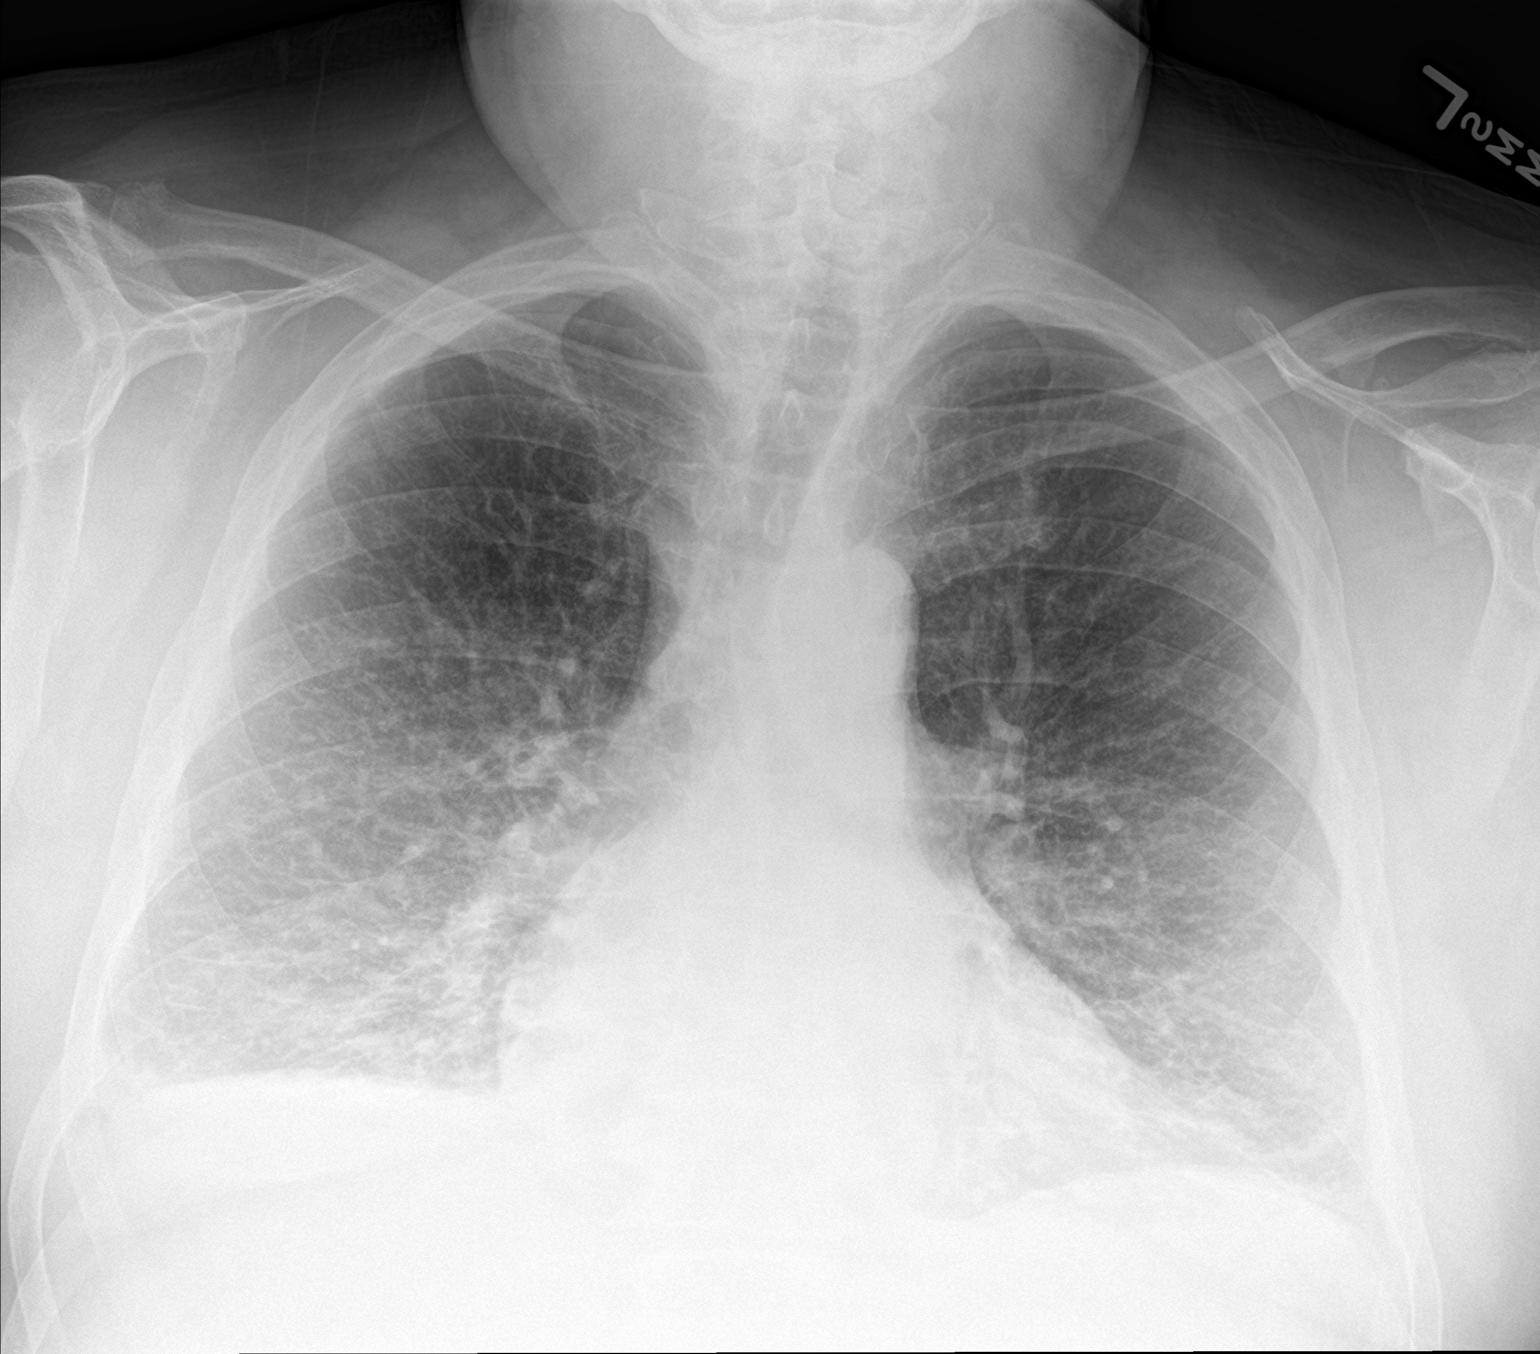

[chest lat]
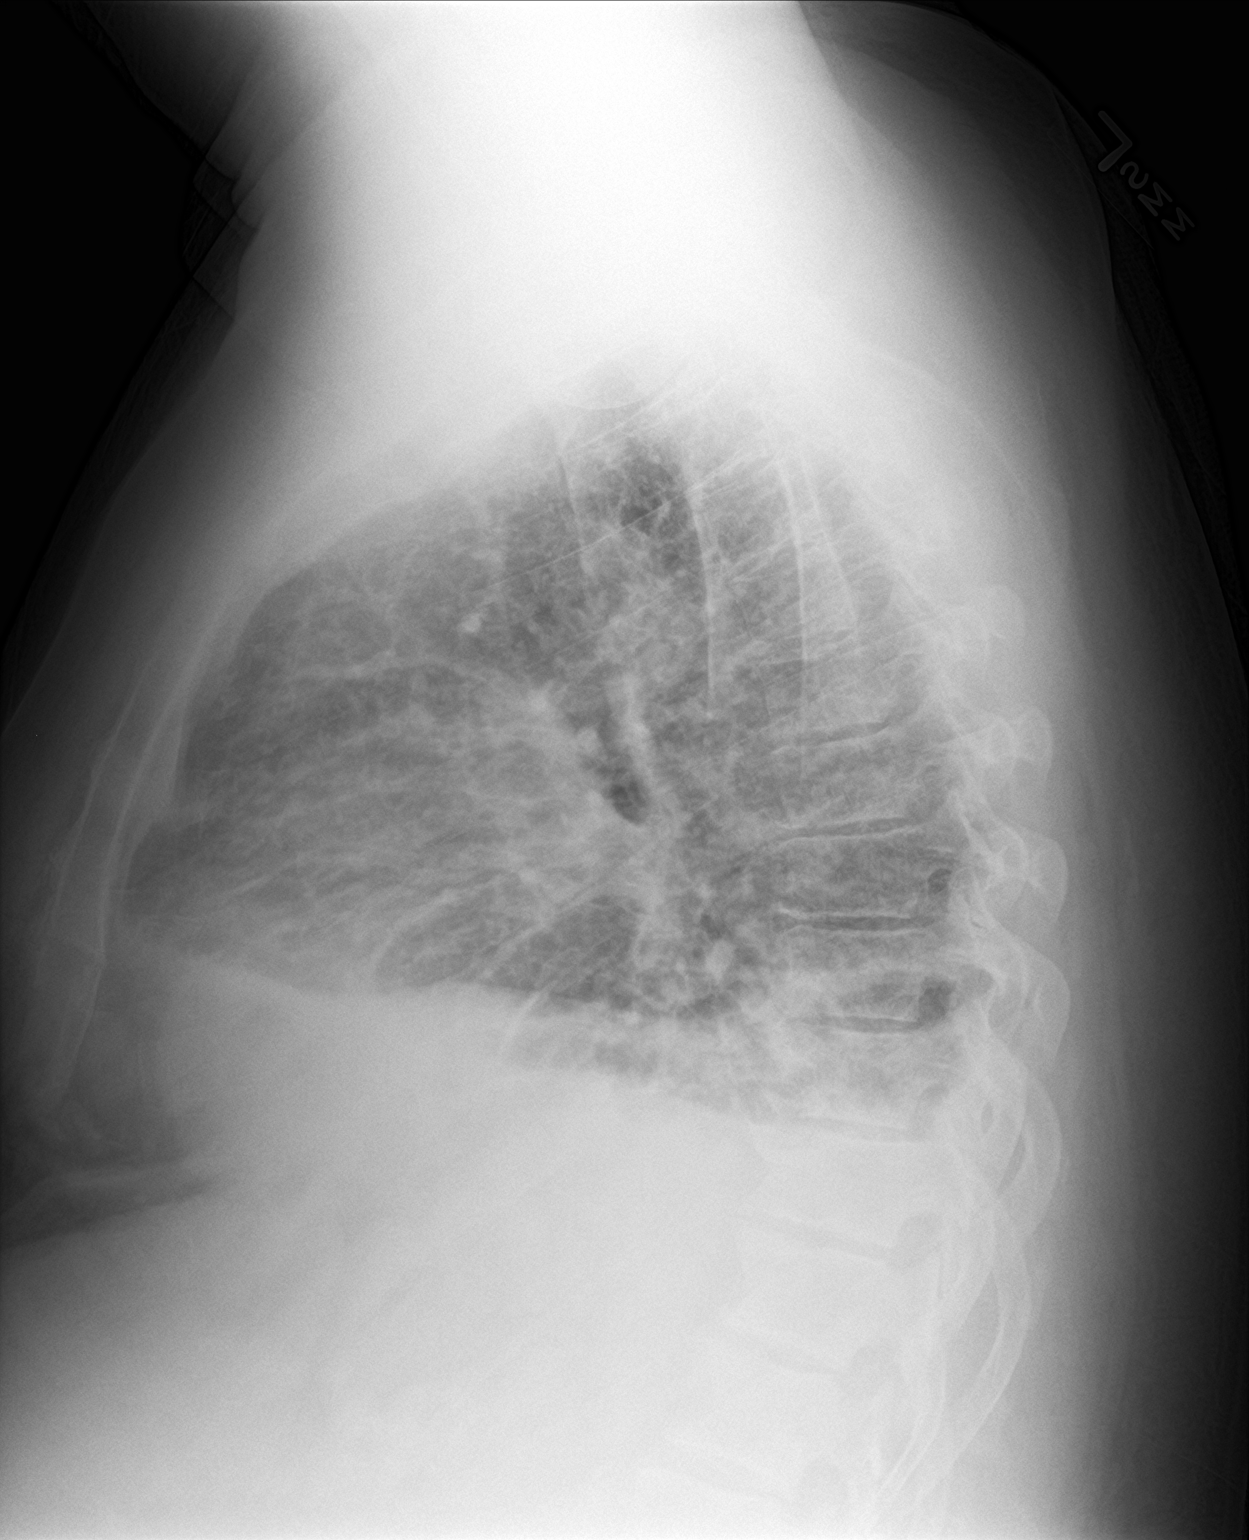

[2 of 2 positions shown; findings below may reference images not displayed]

FINDINGS: The patient has new bilateral mild interstitial pulmonary edema with
Kerley B-lines at the right lung base. Heart size and pulmonary
vascularity are normal. Minimal blunting of the posterior
costophrenic angles suggesting tiny effusions. No acute bone
abnormality.
IMPRESSION: New mild bilateral interstitial pulmonary edema. Probable tiny
bilateral effusions.

## 2022-10-02 DIAGNOSIS — I480 Paroxysmal atrial fibrillation: Secondary | ICD-10-CM

## 2022-10-02 DIAGNOSIS — I2489 Other forms of acute ischemic heart disease: Secondary | ICD-10-CM

## 2022-10-02 DIAGNOSIS — E119 Type 2 diabetes mellitus without complications: Secondary | ICD-10-CM

## 2022-10-02 DIAGNOSIS — I517 Cardiomegaly: Secondary | ICD-10-CM

## 2022-10-02 DIAGNOSIS — N1831 Chronic kidney disease, stage 3a: Secondary | ICD-10-CM

## 2022-10-02 DIAGNOSIS — I119 Hypertensive heart disease without heart failure: Secondary | ICD-10-CM

## 2022-10-03 DIAGNOSIS — B338 Other specified viral diseases: Secondary | ICD-10-CM

## 2022-10-03 DIAGNOSIS — E119 Type 2 diabetes mellitus without complications: Secondary | ICD-10-CM | POA: Diagnosis not present

## 2022-10-03 DIAGNOSIS — I119 Hypertensive heart disease without heart failure: Secondary | ICD-10-CM | POA: Diagnosis not present

## 2022-10-03 DIAGNOSIS — I2489 Other forms of acute ischemic heart disease: Secondary | ICD-10-CM | POA: Diagnosis not present

## 2022-10-03 DIAGNOSIS — I480 Paroxysmal atrial fibrillation: Secondary | ICD-10-CM | POA: Diagnosis not present

## 2022-10-04 DIAGNOSIS — B338 Other specified viral diseases: Secondary | ICD-10-CM

## 2022-10-04 DIAGNOSIS — I493 Ventricular premature depolarization: Secondary | ICD-10-CM

## 2022-10-04 DIAGNOSIS — I480 Paroxysmal atrial fibrillation: Secondary | ICD-10-CM

## 2022-10-04 DIAGNOSIS — I119 Hypertensive heart disease without heart failure: Secondary | ICD-10-CM

## 2022-10-04 DIAGNOSIS — I2489 Other forms of acute ischemic heart disease: Secondary | ICD-10-CM

## 2022-10-04 DIAGNOSIS — N1831 Chronic kidney disease, stage 3a: Secondary | ICD-10-CM

## 2022-10-04 DIAGNOSIS — E119 Type 2 diabetes mellitus without complications: Secondary | ICD-10-CM

## 2023-04-24 ENCOUNTER — Inpatient Hospital Stay (HOSPITAL_COMMUNITY)
Admission: EM | Admit: 2023-04-24 | Discharge: 2023-04-28 | DRG: 064 | Disposition: A | Discharge status: Home / Self Care | Payer: Medicaid Other | Attending: Internal Medicine | Admitting: Internal Medicine

## 2023-04-24 ENCOUNTER — Emergency Department (HOSPITAL_COMMUNITY): Payer: Medicaid Other

## 2023-04-24 ENCOUNTER — Inpatient Hospital Stay (HOSPITAL_COMMUNITY): Payer: Medicaid Other

## 2023-04-24 ENCOUNTER — Encounter (HOSPITAL_COMMUNITY): Payer: Self-pay

## 2023-04-24 ENCOUNTER — Observation Stay (HOSPITAL_COMMUNITY): Payer: Medicaid Other

## 2023-04-24 ENCOUNTER — Other Ambulatory Visit: Payer: Self-pay

## 2023-04-24 DIAGNOSIS — R4781 Slurred speech: Secondary | ICD-10-CM | POA: Diagnosis present

## 2023-04-24 DIAGNOSIS — Z8673 Personal history of transient ischemic attack (TIA), and cerebral infarction without residual deficits: Secondary | ICD-10-CM

## 2023-04-24 DIAGNOSIS — J4489 Other specified chronic obstructive pulmonary disease: Secondary | ICD-10-CM | POA: Diagnosis present

## 2023-04-24 DIAGNOSIS — Z794 Long term (current) use of insulin: Secondary | ICD-10-CM

## 2023-04-24 DIAGNOSIS — R1310 Dysphagia, unspecified: Secondary | ICD-10-CM | POA: Diagnosis present

## 2023-04-24 DIAGNOSIS — E669 Obesity, unspecified: Secondary | ICD-10-CM | POA: Diagnosis present

## 2023-04-24 DIAGNOSIS — I618 Other nontraumatic intracerebral hemorrhage: Principal | ICD-10-CM | POA: Diagnosis present

## 2023-04-24 DIAGNOSIS — E1122 Type 2 diabetes mellitus with diabetic chronic kidney disease: Secondary | ICD-10-CM | POA: Diagnosis present

## 2023-04-24 DIAGNOSIS — Z7984 Long term (current) use of oral hypoglycemic drugs: Secondary | ICD-10-CM

## 2023-04-24 DIAGNOSIS — F1010 Alcohol abuse, uncomplicated: Secondary | ICD-10-CM | POA: Diagnosis present

## 2023-04-24 DIAGNOSIS — D631 Anemia in chronic kidney disease: Secondary | ICD-10-CM | POA: Diagnosis present

## 2023-04-24 DIAGNOSIS — G8194 Hemiplegia, unspecified affecting left nondominant side: Secondary | ICD-10-CM | POA: Diagnosis present

## 2023-04-24 DIAGNOSIS — I10 Essential (primary) hypertension: Secondary | ICD-10-CM

## 2023-04-24 DIAGNOSIS — Z8249 Family history of ischemic heart disease and other diseases of the circulatory system: Secondary | ICD-10-CM | POA: Diagnosis not present

## 2023-04-24 DIAGNOSIS — E785 Hyperlipidemia, unspecified: Secondary | ICD-10-CM | POA: Diagnosis present

## 2023-04-24 DIAGNOSIS — G9340 Encephalopathy, unspecified: Secondary | ICD-10-CM

## 2023-04-24 DIAGNOSIS — I130 Hypertensive heart and chronic kidney disease with heart failure and stage 1 through stage 4 chronic kidney disease, or unspecified chronic kidney disease: Secondary | ICD-10-CM | POA: Diagnosis present

## 2023-04-24 DIAGNOSIS — R001 Bradycardia, unspecified: Secondary | ICD-10-CM | POA: Diagnosis not present

## 2023-04-24 DIAGNOSIS — N1831 Chronic kidney disease, stage 3a: Secondary | ICD-10-CM | POA: Diagnosis present

## 2023-04-24 DIAGNOSIS — I6381 Other cerebral infarction due to occlusion or stenosis of small artery: Secondary | ICD-10-CM | POA: Diagnosis present

## 2023-04-24 DIAGNOSIS — I610 Nontraumatic intracerebral hemorrhage in hemisphere, subcortical: Secondary | ICD-10-CM | POA: Diagnosis not present

## 2023-04-24 DIAGNOSIS — Z6833 Body mass index (BMI) 33.0-33.9, adult: Secondary | ICD-10-CM

## 2023-04-24 DIAGNOSIS — E1165 Type 2 diabetes mellitus with hyperglycemia: Secondary | ICD-10-CM | POA: Diagnosis present

## 2023-04-24 DIAGNOSIS — R29810 Facial weakness: Secondary | ICD-10-CM | POA: Diagnosis present

## 2023-04-24 DIAGNOSIS — E782 Mixed hyperlipidemia: Secondary | ICD-10-CM

## 2023-04-24 DIAGNOSIS — I639 Cerebral infarction, unspecified: Secondary | ICD-10-CM

## 2023-04-24 DIAGNOSIS — E876 Hypokalemia: Secondary | ICD-10-CM | POA: Diagnosis present

## 2023-04-24 DIAGNOSIS — I5032 Chronic diastolic (congestive) heart failure: Secondary | ICD-10-CM | POA: Diagnosis present

## 2023-04-24 DIAGNOSIS — G9349 Other encephalopathy: Secondary | ICD-10-CM | POA: Diagnosis present

## 2023-04-24 DIAGNOSIS — R4182 Altered mental status, unspecified: Principal | ICD-10-CM

## 2023-04-24 DIAGNOSIS — F17210 Nicotine dependence, cigarettes, uncomplicated: Secondary | ICD-10-CM | POA: Diagnosis present

## 2023-04-24 DIAGNOSIS — I619 Nontraumatic intracerebral hemorrhage, unspecified: Secondary | ICD-10-CM | POA: Diagnosis present

## 2023-04-24 DIAGNOSIS — Z79899 Other long term (current) drug therapy: Secondary | ICD-10-CM

## 2023-04-24 DIAGNOSIS — R29707 NIHSS score 7: Secondary | ICD-10-CM | POA: Diagnosis present

## 2023-04-24 DIAGNOSIS — Z7951 Long term (current) use of inhaled steroids: Secondary | ICD-10-CM

## 2023-04-24 LAB — HIV ANTIBODY (ROUTINE TESTING W REFLEX): HIV Screen 4th Generation wRfx: NONREACTIVE

## 2023-04-24 LAB — COMPREHENSIVE METABOLIC PANEL
ALT: 20 U/L (ref 0–44)
AST: 25 U/L (ref 15–41)
Albumin: 3.5 g/dL (ref 3.5–5.0)
Alkaline Phosphatase: 118 U/L (ref 38–126)
Anion gap: 10 (ref 5–15)
BUN: 28 mg/dL — ABNORMAL HIGH (ref 8–23)
CO2: 20 mmol/L — ABNORMAL LOW (ref 22–32)
Calcium: 8.7 mg/dL — ABNORMAL LOW (ref 8.9–10.3)
Chloride: 108 mmol/L (ref 98–111)
Creatinine, Ser: 1.43 mg/dL — ABNORMAL HIGH (ref 0.61–1.24)
GFR, Estimated: 53 mL/min — ABNORMAL LOW (ref >60)
Glucose, Bld: 207 mg/dL — ABNORMAL HIGH (ref 70–99)
Potassium: 3.4 mmol/L — ABNORMAL LOW (ref 3.5–5.1)
Sodium: 138 mmol/L (ref 135–145)
Total Bilirubin: 0.6 mg/dL (ref 0.3–1.2)
Total Protein: 6.3 g/dL — ABNORMAL LOW (ref 6.5–8.1)

## 2023-04-24 LAB — URINALYSIS, W/ REFLEX TO CULTURE (INFECTION SUSPECTED)
Bacteria, UA: NONE SEEN
Bilirubin Urine: NEGATIVE
Glucose, UA: 50 mg/dL — AB
Ketones, ur: NEGATIVE mg/dL
Leukocytes,Ua: NEGATIVE
Nitrite: NEGATIVE
Protein, ur: >=300 mg/dL — AB
Specific Gravity, Urine: 1.017 (ref 1.005–1.030)
pH: 6 (ref 5.0–8.0)

## 2023-04-24 LAB — LIPID PANEL
Cholesterol: 138 mg/dL (ref 0–200)
HDL: 35 mg/dL — ABNORMAL LOW (ref >40)
LDL Cholesterol: 77 mg/dL (ref 0–99)
Total CHOL/HDL Ratio: 3.9 RATIO
Triglycerides: 132 mg/dL (ref <150)
VLDL: 26 mg/dL (ref 0–40)

## 2023-04-24 LAB — CBC WITH DIFFERENTIAL/PLATELET
Abs Immature Granulocytes: 0.03 10*3/uL (ref 0.00–0.07)
Basophils Absolute: 0.1 10*3/uL (ref 0.0–0.1)
Basophils Relative: 1 %
Eosinophils Absolute: 0.3 10*3/uL (ref 0.0–0.5)
Eosinophils Relative: 3 %
HCT: 38 % — ABNORMAL LOW (ref 39.0–52.0)
Hemoglobin: 12.3 g/dL — ABNORMAL LOW (ref 13.0–17.0)
Immature Granulocytes: 0 %
Lymphocytes Relative: 20 %
Lymphs Abs: 1.8 10*3/uL (ref 0.7–4.0)
MCH: 29.4 pg (ref 26.0–34.0)
MCHC: 32.4 g/dL (ref 30.0–36.0)
MCV: 90.9 fL (ref 80.0–100.0)
Monocytes Absolute: 0.8 10*3/uL (ref 0.1–1.0)
Monocytes Relative: 9 %
Neutro Abs: 6 10*3/uL (ref 1.7–7.7)
Neutrophils Relative %: 67 %
Platelets: 161 10*3/uL (ref 150–400)
RBC: 4.18 MIL/uL — ABNORMAL LOW (ref 4.22–5.81)
RDW: 14 % (ref 11.5–15.5)
WBC: 9 10*3/uL (ref 4.0–10.5)
nRBC: 0 % (ref 0.0–0.2)

## 2023-04-24 LAB — GLUCOSE, CAPILLARY
Glucose-Capillary: 169 mg/dL — ABNORMAL HIGH (ref 70–99)
Glucose-Capillary: 111 mg/dL — ABNORMAL HIGH (ref 70–99)
Glucose-Capillary: 174 mg/dL — ABNORMAL HIGH (ref 70–99)

## 2023-04-24 LAB — I-STAT CHEM 8, ED
BUN: 28 mg/dL — ABNORMAL HIGH (ref 8–23)
Calcium, Ion: 1.14 mmol/L — ABNORMAL LOW (ref 1.15–1.40)
Chloride: 108 mmol/L (ref 98–111)
Creatinine, Ser: 1.5 mg/dL — ABNORMAL HIGH (ref 0.61–1.24)
Glucose, Bld: 205 mg/dL — ABNORMAL HIGH (ref 70–99)
HCT: 35 % — ABNORMAL LOW (ref 39.0–52.0)
Hemoglobin: 11.9 g/dL — ABNORMAL LOW (ref 13.0–17.0)
Potassium: 3.4 mmol/L — ABNORMAL LOW (ref 3.5–5.1)
Sodium: 141 mmol/L (ref 135–145)
TCO2: 21 mmol/L — ABNORMAL LOW (ref 22–32)

## 2023-04-24 LAB — APTT: aPTT: 32 seconds (ref 24–36)

## 2023-04-24 LAB — ECHOCARDIOGRAM COMPLETE
AR max vel: 1.69 cm2
AV Area VTI: 1.58 cm2
AV Area mean vel: 1.65 cm2
AV Mean grad: 12 mmHg
AV Peak grad: 22.3 mmHg
Ao pk vel: 2.36 m/s
Area-P 1/2: 2.05 cm2
Height: 66 in
MV M vel: 1.01 m/s
MV Peak grad: 4.1 mmHg
S' Lateral: 2.6 cm
Weight: 3308.66 oz

## 2023-04-24 LAB — ETHANOL: Alcohol, Ethyl (B): <10 mg/dL (ref <10)

## 2023-04-24 LAB — CBG MONITORING, ED: Glucose-Capillary: 159 mg/dL — ABNORMAL HIGH (ref 70–99)

## 2023-04-24 LAB — RAPID URINE DRUG SCREEN, HOSP PERFORMED
Amphetamines: NOT DETECTED
Barbiturates: NOT DETECTED
Benzodiazepines: NOT DETECTED
Cocaine: NOT DETECTED
Opiates: NOT DETECTED
Tetrahydrocannabinol: NOT DETECTED

## 2023-04-24 LAB — PROTIME-INR
INR: 1.1 (ref 0.8–1.2)
Prothrombin Time: 14.1 seconds (ref 11.4–15.2)

## 2023-04-24 LAB — MRSA NEXT GEN BY PCR, NASAL: MRSA by PCR Next Gen: NOT DETECTED

## 2023-04-24 MED ORDER — ACETAMINOPHEN 325 MG PO TABS
650.0000 mg | ORAL_TABLET | ORAL | Status: DC | PRN
  Administered 2023-04-24: 650 mg via ORAL
  Filled 2023-04-24: qty 2

## 2023-04-24 MED ORDER — DULOXETINE HCL 30 MG PO CPEP
30.0000 mg | ORAL_CAPSULE | Freq: Two times a day (BID) | ORAL | Status: DC
  Administered 2023-04-24 – 2023-04-28 (×8): 30 mg via ORAL
  Filled 2023-04-24 (×8): qty 1

## 2023-04-24 MED ORDER — CHLORHEXIDINE GLUCONATE CLOTH 2 % EX PADS
6.0000 | MEDICATED_PAD | Freq: Every day | CUTANEOUS | Status: DC
  Administered 2023-04-25 – 2023-04-26 (×2): 6 via TOPICAL

## 2023-04-24 MED ORDER — IOHEXOL 350 MG/ML SOLN
75.0000 mL | Freq: Once | INTRAVENOUS | Status: AC | PRN
  Administered 2023-04-24: 75 mL via INTRAVENOUS

## 2023-04-24 MED ORDER — ACETAMINOPHEN 650 MG RE SUPP: 650.0000 mg | RECTAL | Status: DC | PRN

## 2023-04-24 MED ORDER — AMLODIPINE BESYLATE 10 MG PO TABS
10.0000 mg | ORAL_TABLET | Freq: Every day | ORAL | Status: DC
  Administered 2023-04-24 – 2023-04-28 (×5): 10 mg via ORAL
  Filled 2023-04-24 (×5): qty 1

## 2023-04-24 MED ORDER — LABETALOL HCL 5 MG/ML IV SOLN
10.0000 mg | INTRAVENOUS | Status: DC | PRN
  Administered 2023-04-24: 10 mg via INTRAVENOUS
  Filled 2023-04-24: qty 4

## 2023-04-24 MED ORDER — CLEVIDIPINE BUTYRATE 0.5 MG/ML IV EMUL
0.0000 mg/h | INTRAVENOUS | Status: DC
  Administered 2023-04-24: 17 mg/h via INTRAVENOUS
  Administered 2023-04-24: 2 mg/h via INTRAVENOUS
  Administered 2023-04-24: 16 mg/h via INTRAVENOUS
  Administered 2023-04-25: 2 mg/h via INTRAVENOUS
  Filled 2023-04-24: qty 50
  Filled 2023-04-24 (×3): qty 100

## 2023-04-24 MED ORDER — STROKE: EARLY STAGES OF RECOVERY BOOK
Freq: Once | Status: AC
  Filled 2023-04-24: qty 1

## 2023-04-24 MED ORDER — CARVEDILOL 3.125 MG PO TABS
6.2500 mg | ORAL_TABLET | Freq: Two times a day (BID) | ORAL | Status: DC
  Administered 2023-04-24: 6.25 mg via ORAL
  Filled 2023-04-24: qty 2

## 2023-04-24 MED ORDER — ACETAMINOPHEN 160 MG/5ML PO SOLN: 650.0000 mg | ORAL | Status: DC | PRN

## 2023-04-24 MED ORDER — INSULIN ASPART 100 UNIT/ML IJ SOLN
2.0000 [IU] | INTRAMUSCULAR | Status: DC
  Administered 2023-04-24 – 2023-04-25 (×4): 4 [IU] via SUBCUTANEOUS
  Administered 2023-04-25 (×3): 2 [IU] via SUBCUTANEOUS
  Administered 2023-04-26 (×3): 6 [IU] via SUBCUTANEOUS

## 2023-04-24 MED ORDER — LISINOPRIL 20 MG PO TABS
40.0000 mg | ORAL_TABLET | Freq: Every day | ORAL | Status: DC
  Administered 2023-04-24 – 2023-04-28 (×5): 40 mg via ORAL
  Filled 2023-04-24 (×5): qty 2

## 2023-04-24 MED ORDER — GABAPENTIN 400 MG PO CAPS
400.0000 mg | ORAL_CAPSULE | Freq: Three times a day (TID) | ORAL | Status: DC
  Administered 2023-04-24 – 2023-04-28 (×13): 400 mg via ORAL
  Filled 2023-04-24 (×13): qty 1

## 2023-04-24 MED ORDER — SODIUM CHLORIDE 0.9% FLUSH: 3.0000 mL | Freq: Once | INTRAVENOUS | Status: DC

## 2023-04-24 MED ORDER — PANTOPRAZOLE SODIUM 40 MG PO TBEC
40.0000 mg | DELAYED_RELEASE_TABLET | Freq: Every day | ORAL | Status: DC
  Administered 2023-04-24 – 2023-04-28 (×5): 40 mg via ORAL
  Filled 2023-04-24 (×5): qty 1

## 2023-04-24 MED ORDER — MOMETASONE FURO-FORMOTEROL FUM 200-5 MCG/ACT IN AERO
2.0000 | INHALATION_SPRAY | Freq: Two times a day (BID) | RESPIRATORY_TRACT | Status: DC
  Administered 2023-04-25 – 2023-04-28 (×2): 2 via RESPIRATORY_TRACT
  Filled 2023-04-24 (×2): qty 8.8

## 2023-04-24 MED ORDER — ATORVASTATIN CALCIUM 10 MG PO TABS
10.0000 mg | ORAL_TABLET | Freq: Every day | ORAL | Status: DC
  Administered 2023-04-24 – 2023-04-27 (×4): 10 mg via ORAL
  Filled 2023-04-24 (×4): qty 1

## 2023-04-24 MED ORDER — SENNOSIDES-DOCUSATE SODIUM 8.6-50 MG PO TABS
1.0000 | ORAL_TABLET | Freq: Two times a day (BID) | ORAL | Status: DC
  Administered 2023-04-24 – 2023-04-28 (×7): 1 via ORAL
  Filled 2023-04-24 (×8): qty 1

## 2023-04-24 MED ORDER — ORAL CARE MOUTH RINSE: 15.0000 mL | OROMUCOSAL | Status: DC | PRN

## 2023-04-24 MED ORDER — PANTOPRAZOLE SODIUM 40 MG IV SOLR: 40.0000 mg | Freq: Every day | INTRAVENOUS | Status: DC

## 2023-04-24 MED ORDER — ALBUTEROL SULFATE (2.5 MG/3ML) 0.083% IN NEBU: 2.5000 mg | INHALATION_SOLUTION | Freq: Four times a day (QID) | RESPIRATORY_TRACT | Status: DC | PRN

## 2023-04-24 MED ORDER — POTASSIUM CHLORIDE CRYS ER 20 MEQ PO TBCR
20.0000 meq | EXTENDED_RELEASE_TABLET | Freq: Every day | ORAL | Status: DC
  Administered 2023-04-24 – 2023-04-28 (×5): 20 meq via ORAL
  Filled 2023-04-24 (×5): qty 1

## 2023-04-24 MED ORDER — LACTATED RINGERS IV SOLN: INTRAVENOUS | Status: DC

## 2023-04-24 NOTE — ED Notes (Signed)
Assumed care of patient who [place] [facility] [place] [facility] as code stroke with Left sided facial droop slurred speech . Per EMS pt was found under car unknown down time last known well 0000. Baseline NIH score 7 . Patient has slurred speech htn

## 2023-04-24 NOTE — Progress Notes (Signed)
EEG complete - results pending.   TELE SPECIALIST

## 2023-04-24 NOTE — Code Documentation (Signed)
Responded to Code Stroke called at 0256 for AMS and slurred speech, LSN-0000. Pt arrived at 0305, CBG-159, NIH-7, CT head- 5 mm hyperdense focus of the left thalamus without surrounding edema.. CTA-no LVO. TNK not given-completed stroke on CT, unclear of validity of LSN.   Plan: stroke workup, VS/neuro checks q2h x 12, then q4h.

## 2023-04-24 NOTE — ED Notes (Signed)
Neurologist at bedside re-evaluating pt who is pending MRI and admit

## 2023-04-24 NOTE — Progress Notes (Signed)
NAME:  Justin Snow, MRN:  [id number], DOB:  [birth year], LOS: 0 ADMISSION DATE:  04/24/2023, CONSULTATION DATE: 04/24/2023 REFERRING MD: [facility], CHIEF COMPLAINT: Bradycardia  History of Present Illness:  67 year old man.  Poor historian.  Unable to tell me why he is in hospital. He was found underneath his car outside a gas station.  Last known well was 3 hours prior.  He was treated as a code stroke due to slurred speech and altered mental status. Workup in the ED was negative for large vessel occlusion.  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  While in the ED he was found to have episodes of bradycardia with no other symptoms associated.  On further questioning, the patient denies any cardiac symptomatology and has not had episodes of bradycardia to his knowledge in the past.  Deviously seen by [facility] for hypertension and leg edema and management of cardiovascular risk factors.  No established coronary disease.  Pertinent  Medical History   Past Medical History:  Diagnosis Date   Asthma    Diabetes mellitus without complication (HCC)     Significant Hospital Events: Including procedures, antibiotic start and stop dates in addition to other pertinent events   7/28-admitted to hospital.  Interim History / Subjective:  By the time he was admitted to the ICU he had had no further episodes of bradycardia for several hours.  The lowest heart rate recorded was 46.  He complains of leg cramps.  He denies recent drinking binge.  Objective   Blood pressure (!) 142/63, pulse 80, temperature 98 F (36.7 C), temperature source Oral, resp. rate 17, height 5\' 6"  (1.676 m), weight 90.9 kg, SpO2 93%.       No intake or output data in the 24 hours ending 04/24/23 1501 Filed Weights   04/24/23 0309 04/24/23 0325 04/24/23 1300  Weight: 93.8 kg 93.8 kg 90.9 kg    Examination: General: Lying in bed calm overweight HENT: No scleral  icterus, dry mucosa. Lungs: Chest clear Cardiovascular: Normal heart sounds Abdomen: Soft and nontender Extremities: No edema Neuro: No focal deficits.  Mild tremor. GU: Deferred  Ancillary tests personally reviewed  EKG shows normal sinus rhythm with no evidence of AV block. Hyperglycemic to 200.  Mild hyperkalemia.  Creatinine elevated at 1.5. Assessment & Plan:  Nonspecific sinus bradycardia now resolved.  No evidence of conduction block or prior history of bradycardia.   Small thalamic hemorrhage.  ICH score 1 Type 2 diabetes CKD stage II Alcohol abuse. COPD by history  Plan:  -Manage bradycardia expectantly.  Continue telemetry observation for 24 hours. -Titrate Cleviprex to keep systolic blood pressure 10/17/1950 as per guidelines. -Transition to home oral medication.  Monitor for bradycardia.  Hold diuretic for now reintroduce prior to discharge. -Scale insulin for now.  Resume home insulin and oral agent regimen at discharge.  Would benefit from SGLT2 agent. -Still looks volume contracted.  Will give IV fluids.  This should help with leg cramping. -Monitor for possible alcohol withdrawal.  Best Practice (right click and "Reselect all SmartList Selections" daily)   Diet/type: Regular consistency (see orders) DVT prophylaxis: prophylactic heparin  GI prophylaxis: N/A Lines: N/A Foley:  N/A Code Status:  full code Last date of multidisciplinary goals of care discussion [patient updated at bedside.]   Lynnell Catalan, MD [facility] ICU Physician [facility]  Pager: [phone] Or Epic Secure Chat After hours: [phone].  04/24/2023, 3:01 PM

## 2023-04-24 NOTE — Progress Notes (Signed)
  Echocardiogram 2D Echocardiogram has been performed.  Justin Snow 04/24/2023, 12:19 PM

## 2023-04-24 NOTE — ED Provider Notes (Signed)
[place] EMERGENCY DEPARTMENT AT [facility] Provider Note   CSN: [id number] Arrival date & time: 04/24/23  1191  An emergency department physician performed an initial assessment on this suspected stroke patient at 0305.  History  Chief Complaint  Patient presents with   Code Stroke    Justin Snow is a 67 y.o. male.  The history is provided by the patient and medical records.   67 y.o. M with hx of HTN, HLP, tobacco dependence, presenting to the ED with EMS via code stroke.  Patient was found underneath his car outside of a gas station.  LKW midnight (3 hours PTA).  Patient has slurred speech and AMS.  He is able to follow some commands on arrival.  Home Medications Prior to Admission medications   Medication Sig Start Date End Date Taking? Authorizing Provider  albuterol (VENTOLIN HFA) 108 (90 Base) MCG/ACT inhaler Inhale into the lungs every 6 (six) hours as needed for wheezing or shortness of breath.    [provider]  amLODipine (NORVASC) 10 MG tablet Take 1 tablet (10 mg total) by mouth daily. 07/31/20   Patwardhan, Anabel Bene, MD  atorvastatin (LIPITOR) 10 MG tablet Take 1 tablet (10 mg total) by mouth at bedtime. 07/31/20   Patwardhan, Anabel Bene, MD  carvedilol (COREG) 6.25 MG tablet Take 1 tablet (6.25 mg total) by mouth 2 (two) times daily. 07/31/20   Patwardhan, Anabel Bene, MD  DULoxetine (CYMBALTA) 30 MG capsule Take 30 mg by mouth 2 (two) times daily. 02/28/20   [provider]  fluticasone-salmeterol (ADVAIR HFA) 115-21 MCG/ACT inhaler Inhale 2 puffs into the lungs 2 (two) times daily.    [provider]  furosemide (LASIX) 40 MG tablet Take 1 tablet (40 mg total) by mouth daily. 07/31/20   Patwardhan, Anabel Bene, MD  gabapentin (NEURONTIN) 400 MG capsule Take 400 mg by mouth 3 (three) times daily. 05/25/20   [provider]  Insulin Syringe-Needle U-100 (INSULIN SYRINGE .5CC/30GX1/2") 30G X 1/2" 0.5 ML MISC Use as directed with  insulin. 10/18/19   Mardella Layman, MD  LANTUS 100 UNIT/ML injection Inject into the skin. Patient not taking: Reported on 07/31/2020 07/15/20   [provider]  lisinopril (ZESTRIL) 40 MG tablet Take 1 tablet (40 mg total) by mouth daily. 07/31/20   Patwardhan, Anabel Bene, MD  metFORMIN (GLUCOPHAGE) 1000 MG tablet Take 1,000 mg by mouth 2 (two) times daily. 02/28/20   [provider]  omeprazole (PRILOSEC) 20 MG capsule Take 20 mg by mouth every morning. 03/18/20   [provider]  potassium chloride (MICRO-K) 10 MEQ CR capsule Take 10 mEq by mouth daily. 05/12/20   [provider]      Allergies    Patient has no known allergies.    Review of Systems   Review of Systems  Neurological:  Positive for speech difficulty.  All other systems reviewed and are negative.   Physical Exam Updated Vital Signs BP (!) 156/83   Pulse 71   Temp 97.9 F (36.6 C) (Oral)   Resp 18   Ht 5\' 6"  (1.676 m)   Wt 93.8 kg   SpO2 93%   BMI 33.38 kg/m   Physical Exam Vitals and nursing note reviewed.  Constitutional:      Appearance: He is well-developed.  HENT:     Head: Normocephalic and atraumatic.  Eyes:     Conjunctiva/sclera: Conjunctivae normal.     Pupils: Pupils are equal, round, and reactive to  light.  Cardiovascular:     Rate and Rhythm: Normal rate and regular rhythm.     Heart sounds: Normal heart sounds.  Pulmonary:     Effort: Pulmonary effort is normal.     Breath sounds: Normal breath sounds.  Abdominal:     General: Bowel sounds are normal.     Palpations: Abdomen is soft.  Musculoskeletal:        General: Normal range of motion.     Cervical back: Normal range of motion.  Skin:    General: Skin is warm and dry.  Neurological:     Mental Status: He is alert and oriented to person, place, and time.     Comments: Awake, alert, speech is slurred, able to follow commands but with some difficulty requiring redirection/repeat instructions, some drift  in LUE/LLE     ED Results / Procedures / Treatments   Labs (all labs ordered are listed, but only abnormal results are displayed) Labs Reviewed  CBC WITH DIFFERENTIAL/PLATELET - Abnormal; Notable for the following components:      Result Value   RBC 4.18 (*)    Hemoglobin 12.3 (*)    HCT 38.0 (*)    All other components within normal limits  COMPREHENSIVE METABOLIC PANEL - Abnormal; Notable for the following components:   Potassium 3.4 (*)    CO2 20 (*)    Glucose, Bld 207 (*)    BUN 28 (*)    Creatinine, Ser 1.43 (*)    Calcium 8.7 (*)    Total Protein 6.3 (*)    GFR, Estimated 53 (*)    All other components within normal limits  I-STAT CHEM 8, ED - Abnormal; Notable for the following components:   Potassium 3.4 (*)    BUN 28 (*)    Creatinine, Ser 1.50 (*)    Glucose, Bld 205 (*)    Calcium, Ion 1.14 (*)    TCO2 21 (*)    Hemoglobin 11.9 (*)    HCT 35.0 (*)    All other components within normal limits  CBG MONITORING, ED - Abnormal; Notable for the following components:   Glucose-Capillary 159 (*)    All other components within normal limits  ETHANOL  PROTIME-INR  APTT  URINALYSIS, W/ REFLEX TO CULTURE (INFECTION SUSPECTED)  RAPID URINE DRUG SCREEN, HOSP PERFORMED    EKG EKG Interpretation Date/Time:  Sunday April 24 2023 03:46:19 EDT Ventricular Rate:  70 PR Interval:  194 QRS Duration:  94 QT Interval:  391 QTC Calculation: 422 R Axis:   -32  Text Interpretation: Sinus rhythm Atrial premature complex Left axis deviation Borderline low voltage, extremity leads When compared with ECG of 10/20/2019, Premature atrial complexes are now present Confirmed by Dione Booze ([id number]) on 04/24/2023 3:56:23 AM  Radiology DG Chest Port 1 View  Result Date: 04/24/2023 CLINICAL DATA:  Altered mental status with slurred speech and left-sided facial droop. EXAM: PORTABLE CHEST 1 VIEW COMPARISON:  October 01, 2022 FINDINGS: The heart size and mediastinal contours are within  normal limits. Mild atelectasis is seen within the bilateral lung bases. No pleural effusions no pneumothorax is identified. Multilevel degenerative changes are seen within the lower thoracic spine. IMPRESSION: Mild bibasilar atelectasis. Electronically Signed   By: Aram Candela M.D.   On: 04/24/2023 04:29   CT ANGIO HEAD NECK W WO CM (CODE STROKE)  Result Date: 04/24/2023 CLINICAL DATA:  Slurred speech EXAM: CT ANGIOGRAPHY HEAD AND NECK WITH AND WITHOUT CONTRAST TECHNIQUE: Multidetector CT imaging of  the head and neck was performed using the standard protocol during bolus administration of intravenous contrast. Multiplanar CT image reconstructions and MIPs were obtained to evaluate the vascular anatomy. Carotid stenosis measurements (when applicable) are obtained utilizing NASCET criteria, using the distal internal carotid diameter as the denominator. RADIATION DOSE REDUCTION: This exam was performed according to the departmental dose-optimization program which includes automated exposure control, adjustment of the mA and/or kV according to patient size and/or use of iterative reconstruction technique. CONTRAST:  75mL OMNIPAQUE IOHEXOL 350 MG/ML SOLN COMPARISON:  None Available. FINDINGS: CTA NECK FINDINGS SKELETON: There is no bony spinal canal stenosis. No lytic or blastic lesion. OTHER NECK: Normal pharynx, larynx and major salivary glands. No cervical lymphadenopathy. Unremarkable thyroid gland. UPPER CHEST: Biapical emphysema AORTIC ARCH: There is calcific atherosclerosis of the aortic arch. Conventional 3 vessel aortic branching pattern. RIGHT CAROTID SYSTEM: No dissection, occlusion or aneurysm. Mild atherosclerotic calcification at the carotid bifurcation without hemodynamically significant stenosis. LEFT CAROTID SYSTEM: No dissection, occlusion or aneurysm. Mild atherosclerotic calcification at the carotid bifurcation without hemodynamically significant stenosis. VERTEBRAL ARTERIES: Left dominant  configuration.There is no dissection, occlusion or flow-limiting stenosis to the skull base (V1-V3 segments). CTA HEAD FINDINGS POSTERIOR CIRCULATION: --Vertebral arteries: Normal V4 segments. --Inferior cerebellar arteries: Normal. --Basilar artery: Normal. --Superior cerebellar arteries: Normal. --Posterior cerebral arteries (PCA): Severe stenosis at the right P1 P2 junction. Fetal origin of the left PCA. ANTERIOR CIRCULATION: --Intracranial internal carotid arteries: Atherosclerotic calcification of the internal carotid arteries at the skull base without hemodynamically significant stenosis. --Anterior cerebral arteries (ACA): Normal. Both A1 segments are present. Patent anterior communicating artery (a-comm). 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 VENOUS SINUSES: As permitted by contrast timing, patent. ANATOMIC VARIANTS: Fetal origin of the left posterior cerebral artery. Review of the MIP images confirms the above findings. IMPRESSION: 1. No emergent large vessel occlusion. 2. 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 3. Severe stenosis at the right PCA P1-P2 junction. 4. Mild bilateral carotid bifurcation atherosclerosis without hemodynamically significant stenosis. Aortic Atherosclerosis (ICD10-I70.0). Electronically Signed   By: Deatra Robinson M.D.   On: 04/24/2023 03:46   CT HEAD CODE STROKE WO CONTRAST  Result Date: 04/24/2023 CLINICAL DATA:  Code stroke.  Altered mental status EXAM: CT HEAD WITHOUT CONTRAST TECHNIQUE: Contiguous axial images were obtained from the base of the skull through the vertex without intravenous contrast. RADIATION DOSE REDUCTION: This exam was performed according to the departmental dose-optimization program which includes automated exposure control, adjustment of the mA and/or kV according to patient size and/or use of iterative reconstruction technique. COMPARISON:  None  Available. FINDINGS: Brain: 5 mm hyperdense focus of the left thalamus without surrounding edema. The size and configuration of the ventricles and extra-axial CSF spaces are normal. There is hypoattenuation of the periventricular white matter, most commonly indicating chronic ischemic microangiopathy. 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 Vascular: No abnormal hyperdensity of the major intracranial arteries or dural venous sinuses. No intracranial atherosclerosis. Skull: The visualized skull base, calvarium and extracranial soft tissues are normal. Sinuses/Orbits: No fluid levels or advanced mucosal thickening of the visualized paranasal sinuses. No mastoid or middle ear effusion. The orbits are normal. ASPECTS [facility] Stroke Program Early CT Score) - Ganglionic level infarction (caudate, lentiform nuclei, internal capsule, insula, M1-M3 cortex): 7 - Supraganglionic infarction (M4-M6 cortex): 3 Total score (0-10 with 10 being normal): 10 IMPRESSION: 1. A 5 mm hyperdense  focus of the left thalamus without surrounding edema. 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 2. ASPECTS is 10. 3. 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 These results were communicated to Dr. Brooke Dare at 3:17 am on 04/24/2023 by text page via the [facility] messaging system. Electronically Signed   By: Deatra Robinson M.D.   On: 04/24/2023 03:18    Procedures Procedures    Medications Ordered in ED Medications  sodium chloride flush (NS) 0.9 % injection 3 mL (3 mLs Intravenous Not Given 04/24/23 0355)  labetalol (NORMODYNE) injection 10 mg (10 mg Intravenous Given 04/24/23 0454)  iohexol (OMNIPAQUE) 350 MG/ML injection 75 mL (75 mLs Intravenous Contrast Given 04/24/23 1610)    ED Course/ Medical Decision Making/ A&P                             Medical Decision Making Amount and/or Complexity of Data Reviewed Labs: ordered. Radiology: ordered and independent interpretation performed. ECG/medicine tests:  ordered and independent interpretation performed.  Risk Decision regarding hospitalization.   67 y.o. M presenting to the ED as a code stroke.  LKW midnight (3 hours PTA). He was apparently at a gas station and found altered underneath his car with unclear circumstances.  On arrival he is awake/alert but seemingly altered.  Speech is slurred, left facial droop.  He is able to follow commands but with some difficulty. Labs pending, taken for head CT.  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  CTA with M2 narrowing but no acute occlusions.  Will obtain MRI.   Labs without leukocytosis.  Does have findings of chronic CKD.  Ethanol is negative.  UA and UDS pending.  Neurology, Dr. Iver Nestle, has evaluated in ED and will follow along, medical admission for ongoing work-up.  Spoke with hospitalist, Dr. Antionette Char-- will admit for ongoing care.  Final Clinical Impression(s) / ED Diagnoses Final diagnoses:  Altered mental status, unspecified altered mental status type    Rx / DC Orders ED Discharge Orders     None         Garlon Hatchet, PA-C 04/24/23 0603    Dione Booze, MD 04/24/23 [phone]

## 2023-04-24 NOTE — Progress Notes (Addendum)
STROKE TEAM PROGRESS NOTE   BRIEF HPI Mr. Justin Snow is a 67 y.o. male with history significant for essential hypertension, hyperlipidemia, type 2 diabetes mellitus, smoking, alcohol use history presenting with altered mental status after a gas station attendant witnessed the patient walking around prior to being found lying under his car confused with left facial droop and slurred speech.  While in the ED, patient had significant hypertension with SBP in the 220s.  Last known well time was reported as midnight but unable to be confirmed.  SIGNIFICANT HOSPITAL EVENTS 7/28: Admitted for AMS, unclear LKW, hypertensive requiring cleviprex, small acute left thalamic hemorrhage on imaging, small acute white matter lacunar infarct  INTERIM HISTORY/SUBJECTIVE No family at bedside, patient is lethargic requiring continued tactile and loud verbal stimulation for arousal. Minimal participation in examination. Oriented to self and place only. Remains on cleviprex. Decreased level of alertness is felt to be disproportionate to insult at this time. Blood pressure stable on cleviprex gtt.   OBJECTIVE  CBC    Component Value Date/Time   WBC 9.0 04/24/2023 0310   RBC 4.18 (L) 04/24/2023 0310   HGB 11.9 (L) 04/24/2023 0313   HCT 35.0 (L) 04/24/2023 0313   PLT 161 04/24/2023 0310   MCV 90.9 04/24/2023 0310   MCH 29.4 04/24/2023 0310   MCHC 32.4 04/24/2023 0310   RDW 14.0 04/24/2023 0310   LYMPHSABS 1.8 04/24/2023 0310   MONOABS 0.8 04/24/2023 0310   EOSABS 0.3 04/24/2023 0310   BASOSABS 0.1 04/24/2023 0310   BMET    Component Value Date/Time   NA 141 04/24/2023 0313   K 3.4 (L) 04/24/2023 0313   CL 108 04/24/2023 0313   CO2 20 (L) 04/24/2023 0310   GLUCOSE 205 (H) 04/24/2023 0313   BUN 28 (H) 04/24/2023 0313   CREATININE 1.50 (H) 04/24/2023 0313   CALCIUM 8.7 (L) 04/24/2023 0310   GFRNONAA 53 (L) 04/24/2023 0310   No results found for: "HGBA1C" No results found for: "CHOL", "HDL",  "LDLCALC", "LDLDIRECT", "TRIG", "CHOLHDL"  Drugs of Abuse     Component Value Date/Time   LABOPIA NONE DETECTED 04/24/2023 0639   COCAINSCRNUR NONE DETECTED 04/24/2023 0639   LABBENZ NONE DETECTED 04/24/2023 0639   AMPHETMU NONE DETECTED 04/24/2023 0639   THCU NONE DETECTED 04/24/2023 0639   LABBARB NONE DETECTED 04/24/2023 0639    Alcohol Level    Component Value Date/Time   ETH <10 04/24/2023 0310   IMAGING past 24 hours MR BRAIN WO CONTRAST  Result Date: 04/24/2023 CLINICAL DATA:  67 year old male code stroke presentation. Slurred speech. Subcentimeter hypodensity in the left thalamus, hemorrhage versus cavernous malformation. EXAM: MRI HEAD WITHOUT CONTRAST TECHNIQUE: Multiplanar, multiecho pulse sequences of the brain and surrounding structures were obtained without intravenous contrast. COMPARISON:  CTA head and neck and head CT 0312 hours today. FINDINGS: The examination had to be discontinued prior to completion, the patient was trying to climb out of the scanner. Mildly degraded axial and coronal DWI, axial T2 and sagittal T1 weighted imaging was obtained. Major intracranial vascular flow voids are preserved. No midline shift. No ventriculomegaly. Cervicomedullary junction and pituitary are within normal limits. Diffusion-weighted imaging is highly heterogeneous at the left thalamus which appears in part due to extensive susceptibility from blood products. And there is a small 4 mm focus of T2 hypointensity corresponding to the hyperdense CT lesion on series 5, image 11. No significant mass effect or edema. 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 Occasional brainstem chronic lacunar infarcts. DWI is positive for a 4-5 mm right corona radiata or pre motor subcortical white matter lacunar infarct on series 2, image 36. No evidence of hemorrhage or mass effect there. No other restricted  diffusion. IMPRESSION: 1. Truncated and mildly motion degraded noncontrast MRI. 2. Small acute Left thalamic hemorrhage (4 mm, no edema or mass effect) superimposed on evidence of extensive chronic microhemorrhages in the deep gray nuclei. 3. And positive also for a small acute white matter lacunar infarct in the right centrum semiovale. No associated hemorrhage or mass effect. 4. Underlying advanced chronic small-vessel disease. Salient findings were communicated to Dr. Iver Nestle At 6:46 am on 04/24/2023 by text page via the [facility] messaging system. Electronically Signed   By: Odessa Fleming M.D.   On: 04/24/2023 06:46   DG Chest Port 1 View  Result Date: 04/24/2023 CLINICAL DATA:  Altered mental status with slurred speech and left-sided facial droop. EXAM: PORTABLE CHEST 1 VIEW COMPARISON:  October 01, 2022 FINDINGS: The heart size and mediastinal contours are within normal limits. Mild atelectasis is seen within the bilateral lung bases. No pleural effusions no pneumothorax is identified. Multilevel degenerative changes are seen within the lower thoracic spine. IMPRESSION: Mild bibasilar atelectasis. Electronically Signed   By: Aram Candela M.D.   On: 04/24/2023 04:29   CT ANGIO HEAD NECK W WO CM (CODE STROKE)  Result Date: 04/24/2023 CLINICAL DATA:  Slurred speech EXAM: CT ANGIOGRAPHY HEAD AND NECK WITH AND WITHOUT CONTRAST TECHNIQUE: Multidetector CT imaging of the head and neck was performed using the standard protocol during bolus administration of intravenous contrast. Multiplanar CT image reconstructions and MIPs were obtained to evaluate the vascular anatomy. Carotid stenosis measurements (when applicable) are obtained utilizing NASCET criteria, using the distal internal carotid diameter as the denominator. RADIATION DOSE REDUCTION: This exam was performed according to the departmental dose-optimization program which includes automated exposure control, adjustment of the mA and/or kV according to patient  size and/or use of iterative reconstruction technique. CONTRAST:  75mL OMNIPAQUE IOHEXOL 350 MG/ML SOLN COMPARISON:  None Available. FINDINGS: CTA NECK FINDINGS SKELETON: There is no bony spinal canal stenosis. No lytic or blastic lesion. OTHER NECK: Normal pharynx, larynx and major salivary glands. No cervical lymphadenopathy. Unremarkable thyroid gland. UPPER CHEST: Biapical emphysema AORTIC ARCH: There is calcific atherosclerosis of the aortic arch. Conventional 3 vessel aortic branching pattern. RIGHT CAROTID SYSTEM: No dissection, occlusion or aneurysm. Mild atherosclerotic calcification at the carotid bifurcation without hemodynamically significant stenosis. LEFT CAROTID SYSTEM: No dissection, occlusion or aneurysm. Mild atherosclerotic calcification at the carotid bifurcation without hemodynamically significant stenosis. VERTEBRAL ARTERIES: Left dominant configuration.There is no dissection, occlusion or flow-limiting stenosis to the skull base (V1-V3 segments). CTA HEAD FINDINGS POSTERIOR CIRCULATION: --Vertebral arteries: Normal V4 segments. --Inferior cerebellar arteries: Normal. --Basilar artery: Normal. --Superior cerebellar arteries: Normal. --Posterior cerebral arteries (PCA): Severe stenosis at the right P1 P2 junction. Fetal origin of the left PCA. ANTERIOR CIRCULATION: --Intracranial internal carotid arteries: Atherosclerotic calcification of the internal carotid arteries at the skull base without hemodynamically significant stenosis. --Anterior cerebral arteries (ACA): Normal. Both A1 segments are present. Patent anterior communicating artery (a-comm). 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 VENOUS SINUSES: As permitted by contrast timing, patent. ANATOMIC VARIANTS: Fetal origin of the left posterior cerebral artery. Review of the MIP images confirms the above findings. IMPRESSION: 1. No emergent large  vessel occlusion. 2.  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 3. Severe stenosis at the right PCA P1-P2 junction. 4. Mild bilateral carotid bifurcation atherosclerosis without hemodynamically significant stenosis. Aortic Atherosclerosis (ICD10-I70.0). Electronically Signed   By: Deatra Robinson M.D.   On: 04/24/2023 03:46   CT HEAD CODE STROKE WO CONTRAST  Result Date: 04/24/2023 CLINICAL DATA:  Code stroke.  Altered mental status EXAM: CT HEAD WITHOUT CONTRAST TECHNIQUE: Contiguous axial images were obtained from the base of the skull through the vertex without intravenous contrast. RADIATION DOSE REDUCTION: This exam was performed according to the departmental dose-optimization program which includes automated exposure control, adjustment of the mA and/or kV according to patient size and/or use of iterative reconstruction technique. COMPARISON:  None Available. FINDINGS: Brain: 5 mm hyperdense focus of the left thalamus without surrounding edema. The size and configuration of the ventricles and extra-axial CSF spaces are normal. There is hypoattenuation of the periventricular white matter, most commonly indicating chronic ischemic microangiopathy. 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 Vascular: No abnormal hyperdensity of the major intracranial arteries or dural venous sinuses. No intracranial atherosclerosis. Skull: The visualized skull base, calvarium and extracranial soft tissues are normal. Sinuses/Orbits: No fluid levels or advanced mucosal thickening of the visualized paranasal sinuses. No mastoid or middle ear effusion. The orbits are normal. ASPECTS (Alberta Stroke Program Early CT Score) - Ganglionic level infarction (caudate, lentiform nuclei, internal capsule, insula, M1-M3 cortex): 7 - Supraganglionic infarction (M4-M6 cortex): 3 Total score (0-10 with 10 being normal): 10 IMPRESSION: 1. A 5 mm hyperdense focus of the left thalamus without surrounding edema. 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 2. ASPECTS is 10. 3. 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 These results were communicated to Dr. Brooke Dare at 3:17 am on 04/24/2023 by text page via the [facility] messaging system. Electronically Signed   By: Deatra Robinson M.D.   On: 04/24/2023 03:18    Vitals:   04/24/23 0700 04/24/23 0715 04/24/23 0730 04/24/23 0745  BP: (!) 174/90 (!) 170/67 132/62 (!) 152/72  Pulse: (!) 46 (!) 53 72 (!) 54  Resp: 12 14 15 15   Temp:      TempSrc:      SpO2: 99% 93% 96% 97%  Weight:      Height:       PHYSICAL EXAM General:  Alert, well-nourished, well-developed Caucasian male patient in no acute distress Psych: Lethargic, limited cooperation with exam related to lethargy CV: Regular rate and rhythm on monitor Respiratory:  Regular, unlabored respirations on room air GI: Abdomen soft and nontender   NEURO:  Mental Status: Patient is lethargic, requiring constant loud verbal and tactile stimuli for arousal and participation.  Patient has mild dysarthria with minimal speech output.  He is oriented to self and place.  He does not recall events leading to hospitalization.  He states that he does not know why he is in the hospital.  He states that he is 67 years old.  When asked the month and year, patient repeats "52" or "53".  Cranial Nerves:  II: PERRL. Visual fields full.  III, IV, VI: EOMI. will track examiner to the left and right. V: Sensation is intact to light touch and symmetrical to face.  VII: Left facial weakness is noted VIII: Hearing intact to voice. IX, X:  Phonation is normal.  VH:QIONGEXB shrug 5/5. XII: Tongue protrudes midline Motor: Limited cooperation with confrontational strength testing. Patient does have some  left grip weakness, left arm weakness compared to the right.  Right arm 5/5 strength.  Left 4/5 strength.  Patient is able to elevate bilateral lower extremities antigravity.  Mild left lower extremity weakness compared to the  right. Tone: is normal and bulk is normal Sensation: Patient reports equal sensation to light touch bilaterally Coordination: Patient does not participate Gait: Deferred for patient safety  ASSESSMENT/PLAN  Intracerebral Hemorrhage: Small acute left thalamic hemorrhage, likely hypertensive in etiology versus less likely hemorrhagic transformation of ischemic infarct. Small acute lacunar white matter infarct in the right centrum semiovale due to small vessel disease CT head 7/28: 5 mm hyperdense focus in the left thalamus without surrounding edema. Aspects is 10.  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 CTA head & neck 7/28: No emergent LVO.  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  Mild bilateral carotid bifurcation atherosclerosis without hemodynamically significant stenosis. MRI 7/28: Truncated and mildly motion degraded noncontrast MRI.  Small acute left thalamic hemorrhage superimposed on evidence of extensive chronic microhemorrhages in the deep gray nuclei.  Small acute white matter lacunar infarct in the right centrum semiovale.  No associated hemorrhage or mass effect.  Underlying advanced chronic small vessel disease. EEG pending  2D Echo pending LDL No results found for requested labs within last 1095 days. HgbA1c No results found for requested labs within last 1095 days. 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 identified on CT head imaging 04/24/2023 MRI brain 04/24/2023 with evidence of numerous lacunar infarcts in addition to the evidence of blood products with occasional brainstem chronic lacunar infarcts Patient with no reported or documented history of strokes or previous stroke symptoms  Hypertension Home meds:   amlodipine, carvedilol  Unstable requiring Cleviprex gtt PRN labetalol  Blood Pressure Goal: SBP between 130-150 for 24 hours and then less than 160   Hyperlipidemia Home meds:  atorvastatin 10 mg LDL No results found for requested labs within last 1095 days., goal < 70 Further statin recommendations pending lipid panel findings  Continue statin at discharge  Diabetes type II Uncontrolled Home meds:  Metformin, lantus  HgbA1c No results found for requested labs within last 1095 days., goal < 7.0 CBGs SSI Recommend close follow-up with PCP for better DM control  Tobacco Abuse Patient smokes 1 packs per day for many years      Ready to quit? 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  Unable to participate in bedside swallow at this time due to lethargy SLP as needed if patient fails bedside swallow with improvement in level of alertness    Diet   Diet NPO time specified   Advance diet as tolerated  Other Stroke Risk Factors Obesity, Body mass index is 33.38 kg/m., BMI >/= 30 associated with increased stroke risk, recommend weight loss, diet and exercise as appropriate   Other Active Problems History of alcohol use Serum EtOH without evidence of intoxication on arrival Patient denies recent alcohol use  Hospital day # 0  Lanae Boast, AGACNP-BC [facility] Pager: [phone]  STROKE MD NOTE :  I have personally obtained history,examined this patient, reviewed notes, independently viewed imaging studies, participated  in medical decision making and plan of care.ROS completed by me personally and pertinent positives fully documented  I have made any additions or clarifications directly to the above note. Agree with note above.  Patient was brought in for evaluation for decreased responsiveness with slurred speech and left facial droop and  confusion.  MRI scan shows small hemorrhagic left thalamic lacunar infarct as well as a small right corona radiata acute lacunar infarct however his neurological presentation and sleepiness seems out of proportion to his infarct.  Recommend check EEG for seizure activity.  Metabolic panel lab CBC and TSH.  All unremarkable.  EEG is negative.  Recommend strict blood pressure control with systolic goal 130-150 for the first 24 hours and then below 160.  Close neurological observation.  No family available at the bedside.  This patient is critically ill and at significant risk of neurological worsening, death and care requires constant monitoring of vital signs, hemodynamics,respiratory and cardiac monitoring, extensive review of multiple databases, frequent neurological assessment, discussion with family, other specialists and medical decision making of high complexity.I have made any additions or clarifications directly to the above note.This critical care time does not reflect procedure time, or teaching time or supervisory time of PA/NP/Med Resident etc but could involve care discussion time.  I spent 30 minutes of neurocritical care time  in the care of  this patient.      Delia Heady, MD Medical Director [facility] Pager: [phone] 04/24/2023 2:56 PM  To contact Stroke Continuity provider, please refer to [email or web address]. After hours, contact General Neurology

## 2023-04-24 NOTE — ED Notes (Signed)
Patient to MRI.

## 2023-04-24 NOTE — ED Notes (Signed)
ED TO INPATIENT HANDOFF REPORT  ED Nurse Name and Phone #:  [phone]  S Name/Age/Gender Justin Snow 67 y.o. male Room/Bed: 035C/035C  Code Status   Code Status: Full Code  Home/SNF/Other Home Patient oriented to: self Is this baseline? No   Triage Complete: Triage complete  Chief Complaint Acute encephalopathy [G93.40] ICH (intracerebral hemorrhage) (HCC) [I61.9]  Triage Note Patient BIB [facility] from a [facility] due to possible code stroke. Patient was found laying under his car. EMS reports slurred speech and L sided facial droop. LKW 0000. Patient A&Ox2.   Allergies No Known Allergies  Level of Care/Admitting Diagnosis ED Disposition     ED Disposition  Admit   Condition  --   Comment  Hospital Area: [facility] [[id number]]  Level of Care: ICU [6]  May admit patient to [facility] or [facility] if equivalent level of care is available:: No  Covid Evaluation: Asymptomatic - no recent exposure (last 10 days) testing not required  Diagnosis: ICH (intracerebral hemorrhage) [facility]) [244010]  Admitting Physician: Gordy Councilman [[id number]]  Attending Physician: Gordy Councilman [[id number]]  Certification:: I certify this patient will need inpatient services for at least 2 midnights  Estimated Length of Stay: 2          B Medical/Surgery History Past Medical History:  Diagnosis Date   Asthma    Diabetes mellitus without complication (HCC)    History reviewed. No pertinent surgical history.   A IV Location/Drains/Wounds Patient Lines/Drains/Airways Status     Active Line/Drains/Airways     Name Placement date Placement time Site Days   Peripheral IV 04/24/23 Right Antecubital 04/24/23  --  Antecubital  less than 1   Peripheral IV 04/24/23 18 G Left Antecubital 04/24/23  --  Antecubital  less than 1            Intake/Output Last 24 hours No intake or output data in the 24 hours ending 04/24/23 1130  Labs/Imaging Results  for orders placed or performed during the hospital encounter of 04/24/23 (from the past 48 hour(s))  CBG monitoring, ED     Status: Abnormal   Collection Time: 04/24/23  3:08 AM  Result Value Ref Range   Glucose-Capillary 159 (H) 70 - 99 mg/dL    Comment: Glucose reference range applies only to samples taken after fasting for at least 8 hours.  CBC with Differential     Status: Abnormal   Collection Time: 04/24/23  3:10 AM  Result Value Ref Range   WBC 9.0 4.0 - 10.5 K/uL   RBC 4.18 (L) 4.22 - 5.81 MIL/uL   Hemoglobin 12.3 (L) 13.0 - 17.0 g/dL   HCT 25.9 (L) 56.3 - 87.5 %   MCV 90.9 80.0 - 100.0 fL   MCH 29.4 26.0 - 34.0 pg   MCHC 32.4 30.0 - 36.0 g/dL   RDW 64.3 32.9 - 51.8 %   Platelets 161 150 - 400 K/uL   nRBC 0.0 0.0 - 0.2 %   Neutrophils Relative % 67 %   Neutro Abs 6.0 1.7 - 7.7 K/uL   Lymphocytes Relative 20 %   Lymphs Abs 1.8 0.7 - 4.0 K/uL   Monocytes Relative 9 %   Monocytes Absolute 0.8 0.1 - 1.0 K/uL   Eosinophils Relative 3 %   Eosinophils Absolute 0.3 0.0 - 0.5 K/uL   Basophils Relative 1 %   Basophils Absolute 0.1 0.0 - 0.1 K/uL   Immature Granulocytes 0 %   Abs  Immature Granulocytes 0.03 0.00 - 0.07 K/uL    Comment: Performed at [facility], [address], [place], [place] [address]  Comprehensive metabolic panel     Status: Abnormal   Collection Time: 04/24/23  3:10 AM  Result Value Ref Range   Sodium 138 135 - 145 mmol/L   Potassium 3.4 (L) 3.5 - 5.1 mmol/L   Chloride 108 98 - 111 mmol/L   CO2 20 (L) 22 - 32 mmol/L   Glucose, Bld 207 (H) 70 - 99 mg/dL    Comment: Glucose reference range applies only to samples taken after fasting for at least 8 hours.   BUN 28 (H) 8 - 23 mg/dL   Creatinine, Ser 9.56 (H) 0.61 - 1.24 mg/dL   Calcium 8.7 (L) 8.9 - 10.3 mg/dL   Total Protein 6.3 (L) 6.5 - 8.1 g/dL   Albumin 3.5 3.5 - 5.0 g/dL   AST 25 15 - 41 U/L   ALT 20 0 - 44 U/L   Alkaline Phosphatase 118 38 - 126 U/L   Total Bilirubin 0.6 0.3 - 1.2 mg/dL    GFR, Estimated 53 (L) >60 mL/min    Comment: (NOTE) Calculated using the CKD-EPI Creatinine Equation (2021)    Anion gap 10 5 - 15    Comment: Performed at [facility], [address] [place], [place] [address]  Ethanol     Status: None   Collection Time: 04/24/23  3:10 AM  Result Value Ref Range   Alcohol, Ethyl (B) <10 <10 mg/dL    Comment: (NOTE) Lowest detectable limit for serum alcohol is 10 mg/dL.  For medical purposes only. Performed at [facility], [address], [place], [place] [address]   Protime-INR     Status: None   Collection Time: 04/24/23  3:10 AM  Result Value Ref Range   Prothrombin Time 14.1 11.4 - 15.2 seconds   INR 1.1 0.8 - 1.2    Comment: (NOTE) INR goal varies based on device and disease states. Performed at [facility], [address], [place], [place] [address]   APTT     Status: None   Collection Time: 04/24/23  3:10 AM  Result Value Ref Range   aPTT 32 24 - 36 seconds    Comment: Performed at [facility], [address], [place], [place] [address]  I-stat chem 8, ED     Status: Abnormal   Collection Time: 04/24/23  3:13 AM  Result Value Ref Range   Sodium 141 135 - 145 mmol/L   Potassium 3.4 (L) 3.5 - 5.1 mmol/L   Chloride 108 98 - 111 mmol/L   BUN 28 (H) 8 - 23 mg/dL   Creatinine, Ser 3.24 (H) 0.61 - 1.24 mg/dL   Glucose, Bld 401 (H) 70 - 99 mg/dL    Comment: Glucose reference range applies only to samples taken after fasting for at least 8 hours.   Calcium, Ion 1.14 (L) 1.15 - 1.40 mmol/L   TCO2 21 (L) 22 - 32 mmol/L   Hemoglobin 11.9 (L) 13.0 - 17.0 g/dL   HCT 02.7 (L) 25.3 - 66.4 %  Urinalysis, w/ Reflex to Culture (Infection Suspected) -Urine, Clean Catch     Status: Abnormal   Collection Time: 04/24/23  6:39 AM  Result Value Ref Range   Specimen Source URINE, CLEAN CATCH    Color, Urine STRAW (A) YELLOW   APPearance CLEAR CLEAR   Specific Gravity, Urine 1.017  1.005 - 1.030   pH 6.0 5.0 - 8.0    Glucose, UA 50 (A) NEGATIVE mg/dL   Hgb urine dipstick SMALL (A) NEGATIVE   Bilirubin Urine NEGATIVE NEGATIVE   Ketones, ur NEGATIVE NEGATIVE mg/dL   Protein, ur >=324 (A) NEGATIVE mg/dL   Nitrite NEGATIVE NEGATIVE   Leukocytes,Ua NEGATIVE NEGATIVE   RBC / HPF 0-5 0 - 5 RBC/hpf   WBC, UA 0-5 0 - 5 WBC/hpf    Comment:        Reflex urine culture not performed if WBC <=10, OR if Squamous epithelial cells >5. If Squamous epithelial cells >5 suggest recollection.    Bacteria, UA NONE SEEN NONE SEEN   Squamous Epithelial / HPF 0-5 0 - 5 /HPF   Mucus PRESENT     Comment: Performed at [facility], [address], [place], [place] [address]  Rapid urine drug screen (hospital performed)     Status: None   Collection Time: 04/24/23  6:39 AM  Result Value Ref Range   Opiates NONE DETECTED NONE DETECTED   Cocaine NONE DETECTED NONE DETECTED   Benzodiazepines NONE DETECTED NONE DETECTED   Amphetamines NONE DETECTED NONE DETECTED   Tetrahydrocannabinol NONE DETECTED NONE DETECTED   Barbiturates NONE DETECTED NONE DETECTED    Comment: (NOTE) DRUG SCREEN FOR MEDICAL PURPOSES ONLY.  IF CONFIRMATION IS NEEDED FOR ANY PURPOSE, NOTIFY LAB WITHIN 5 DAYS.  LOWEST DETECTABLE LIMITS FOR URINE DRUG SCREEN Drug Class                     Cutoff (ng/mL) Amphetamine and metabolites    1000 Barbiturate and metabolites    200 Benzodiazepine                 200 Opiates and metabolites        300 Cocaine and metabolites        300 THC                            50 Performed at [facility], [address], [place], [place] [address]    MR BRAIN WO CONTRAST  Result Date: 04/24/2023 CLINICAL DATA:  67 year old male code stroke presentation. Slurred speech. Subcentimeter hypodensity in the left thalamus, hemorrhage versus cavernous malformation. EXAM: MRI HEAD WITHOUT CONTRAST TECHNIQUE: Multiplanar, multiecho pulse sequences of the brain and surrounding structures were obtained without  intravenous contrast. COMPARISON:  CTA head and neck and head CT 0312 hours today. FINDINGS: The examination had to be discontinued prior to completion, the patient was trying to climb out of the scanner. Mildly degraded axial and coronal DWI, axial T2 and sagittal T1 weighted imaging was obtained. Major intracranial vascular flow voids are preserved. No midline shift. No ventriculomegaly. Cervicomedullary junction and pituitary are within normal limits. Diffusion-weighted imaging is highly heterogeneous at the left thalamus which appears in part due to extensive susceptibility from blood products. And there is a small 4 mm focus of T2 hypointensity corresponding to the hyperdense CT lesion on series 5, image 11. No significant mass effect or edema. 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 Occasional brainstem chronic lacunar infarcts. DWI is positive for a 4-5 mm right corona radiata or pre motor subcortical white matter lacunar infarct on series 2, image 36. No evidence of hemorrhage or mass effect there. No other restricted diffusion. IMPRESSION:  1. Truncated and mildly motion degraded noncontrast MRI. 2. Small acute Left thalamic hemorrhage (4 mm, no edema or mass effect) superimposed on evidence of extensive chronic microhemorrhages in the deep gray nuclei. 3. And positive also for a small acute white matter lacunar infarct in the right centrum semiovale. No associated hemorrhage or mass effect. 4. Underlying advanced chronic small-vessel disease. Salient findings were communicated to Dr. Iver Nestle At 6:46 am on 04/24/2023 by text page via the [facility] messaging system. Electronically Signed   By: Odessa Fleming M.D.   On: 04/24/2023 06:46   DG Chest Port 1 View  Result Date: 04/24/2023 CLINICAL DATA:  Altered mental status with slurred speech and left-sided facial droop. EXAM: PORTABLE CHEST 1 VIEW COMPARISON:  October 01, 2022 FINDINGS: The heart size and mediastinal contours are within normal limits. Mild atelectasis is seen within the bilateral lung bases. No pleural effusions no pneumothorax is identified. Multilevel degenerative changes are seen within the lower thoracic spine. IMPRESSION: Mild bibasilar atelectasis. Electronically Signed   By: Aram Candela M.D.   On: 04/24/2023 04:29   CT ANGIO HEAD NECK W WO CM (CODE STROKE)  Result Date: 04/24/2023 CLINICAL DATA:  Slurred speech EXAM: CT ANGIOGRAPHY HEAD AND NECK WITH AND WITHOUT CONTRAST TECHNIQUE: Multidetector CT imaging of the head and neck was performed using the standard protocol during bolus administration of intravenous contrast. Multiplanar CT image reconstructions and MIPs were obtained to evaluate the vascular anatomy. Carotid stenosis measurements (when applicable) are obtained utilizing NASCET criteria, using the distal internal carotid diameter as the denominator. RADIATION DOSE REDUCTION: This exam was performed according to the departmental dose-optimization program which includes automated exposure control, adjustment of the mA and/or kV according to patient size and/or use of iterative reconstruction technique. CONTRAST:  75mL OMNIPAQUE IOHEXOL 350 MG/ML SOLN COMPARISON:  None Available. FINDINGS: CTA NECK FINDINGS SKELETON: There is no bony spinal canal stenosis. No lytic or blastic lesion. OTHER NECK: Normal pharynx, larynx and major salivary glands. No cervical lymphadenopathy. Unremarkable thyroid gland. UPPER CHEST: Biapical emphysema AORTIC ARCH: There is calcific atherosclerosis of the aortic arch. Conventional 3 vessel aortic branching pattern. RIGHT CAROTID SYSTEM: No dissection, occlusion or aneurysm. Mild atherosclerotic calcification at the carotid bifurcation without hemodynamically significant stenosis. LEFT CAROTID SYSTEM: No dissection, occlusion or aneurysm. Mild atherosclerotic calcification at the carotid bifurcation without  hemodynamically significant stenosis. VERTEBRAL ARTERIES: Left dominant configuration.There is no dissection, occlusion or flow-limiting stenosis to the skull base (V1-V3 segments). CTA HEAD FINDINGS POSTERIOR CIRCULATION: --Vertebral arteries: Normal V4 segments. --Inferior cerebellar arteries: Normal. --Basilar artery: Normal. --Superior cerebellar arteries: Normal. --Posterior cerebral arteries (PCA): Severe stenosis at the right P1 P2 junction. Fetal origin of the left PCA. ANTERIOR CIRCULATION: --Intracranial internal carotid arteries: Atherosclerotic calcification of the internal carotid arteries at the skull base without hemodynamically significant stenosis. --Anterior cerebral arteries (ACA): Normal. Both A1 segments are present. Patent anterior communicating artery (a-comm). 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 VENOUS SINUSES: As permitted by contrast timing, patent. ANATOMIC VARIANTS: Fetal origin of the left posterior cerebral artery. Review of the MIP images confirms the above findings. IMPRESSION: 1. No emergent large vessel occlusion. 2. 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 3. Severe stenosis at the right PCA P1-P2 junction. 4. Mild bilateral carotid bifurcation atherosclerosis without hemodynamically significant stenosis. Aortic Atherosclerosis (ICD10-I70.0). Electronically Signed   By: Deatra Robinson M.D.   On: 04/24/2023 03:46  CT HEAD CODE STROKE WO CONTRAST  Result Date: 04/24/2023 CLINICAL DATA:  Code stroke.  Altered mental status EXAM: CT HEAD WITHOUT CONTRAST TECHNIQUE: Contiguous axial images were obtained from the base of the skull through the vertex without intravenous contrast. RADIATION DOSE REDUCTION: This exam was performed according to the departmental dose-optimization program which includes automated exposure control, adjustment of the mA and/or kV according to patient size  and/or use of iterative reconstruction technique. COMPARISON:  None Available. FINDINGS: Brain: 5 mm hyperdense focus of the left thalamus without surrounding edema. The size and configuration of the ventricles and extra-axial CSF spaces are normal. There is hypoattenuation of the periventricular white matter, most commonly indicating chronic ischemic microangiopathy. 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 Vascular: No abnormal hyperdensity of the major intracranial arteries or dural venous sinuses. No intracranial atherosclerosis. Skull: The visualized skull base, calvarium and extracranial soft tissues are normal. Sinuses/Orbits: No fluid levels or advanced mucosal thickening of the visualized paranasal sinuses. No mastoid or middle ear effusion. The orbits are normal. ASPECTS (Alberta Stroke Program Early CT Score) - Ganglionic level infarction (caudate, lentiform nuclei, internal capsule, insula, M1-M3 cortex): 7 - Supraganglionic infarction (M4-M6 cortex): 3 Total score (0-10 with 10 being normal): 10 IMPRESSION: 1. A 5 mm hyperdense focus of the left thalamus without surrounding edema. 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 2. ASPECTS is 10. 3. 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 These results were communicated to Dr. Brooke Dare at 3:17 am on 04/24/2023 by text page via the [facility] messaging system. Electronically Signed   By: Deatra Robinson M.D.   On: 04/24/2023 03:18    Pending Labs Unresulted Labs (From admission, onward)     Start     Ordered   04/24/23 1058  Lipid panel  Add-on,   AD        04/24/23 1057   04/24/23 1058  Hemoglobin A1c  Add-on,   AD        04/24/23 1057   04/24/23 0655  HIV Antibody (routine testing w rflx)  (HIV Antibody (Routine testing w reflex) panel)  Once,   R        04/24/23 0656            Vitals/Pain Today's Vitals   04/24/23 1000 04/24/23 1045 04/24/23 1100 04/24/23 1115  BP: (!) 149/60 (!) 157/81 (!) 151/72 (!) 170/89   Pulse: 71 90 78 84  Resp: 14 19 15 20   Temp:      TempSrc:      SpO2: 97% 94% 98% 99%  Weight:      Height:      PainSc:        Isolation Precautions No active isolations  Medications Medications  sodium chloride flush (NS) 0.9 % injection 3 mL (3 mLs Intravenous Not Given 04/24/23 0355)  labetalol (NORMODYNE) injection 10 mg (10 mg Intravenous Given 04/24/23 0454)   stroke: early stages of recovery book (has no administration in time range)  acetaminophen (TYLENOL) tablet 650 mg (has no administration in time range)    Or  acetaminophen (TYLENOL) 160 MG/5ML solution 650 mg (has no administration in time range)    Or  acetaminophen (TYLENOL) suppository 650 mg (has no administration in time range)  senna-docusate (Senokot-S) tablet 1 tablet (has no administration in time range)  pantoprazole (PROTONIX) injection 40 mg (has no administration in time range)  clevidipine (CLEVIPREX) infusion 0.5 mg/mL (6 mg/hr Intravenous Rate/Dose Change 04/24/23 1124)  iohexol (  OMNIPAQUE) 350 MG/ML injection 75 mL (75 mLs Intravenous Contrast Given 04/24/23 0333)    Mobility walks     Focused Assessments Neuro Assessment Handoff:  Swallow screen pass? Yes    NIH Stroke Scale  Dizziness Present: No Headache Present: No Interval: Other (Comment) Level of Consciousness (1a.)   : Alert, keenly responsive LOC Questions (1b. )   : Answers neither question correctly LOC Commands (1c. )   : Performs both tasks correctly Best Gaze (2. )  : Normal Visual (3. )  : No visual loss Facial Palsy (4. )    : Minor paralysis Motor Arm, Left (5a. )   : No drift Motor Arm, Right (5b. ) : No drift Motor Leg, Left (6a. )  : Drift Motor Leg, Right (6b. ) : Drift Limb Ataxia (7. ): Absent Sensory (8. )  : Mild-to-moderate sensory loss, patient feels pinprick is less sharp or is dull on the affected side, or there is a loss of superficial pain with pinprick, but patient is aware of being touched Best  Language (9. )  : No aphasia Dysarthria (10. ): Mild-to-moderate dysarthria, patient slurs at least some words and, at worst, can be understood with some difficulty Extinction/Inattention (11.)   : No Abnormality Complete NIHSS TOTAL: 7 Last date known well: 04/24/23 Last time known well: 0000 Neuro Assessment:   Neuro Checks:   Initial (04/24/23 0310)  Has TPA been given? No If patient is a Neuro Trauma and patient is going to OR before floor call report to 4N Charge nurse: [phone] or [phone]   R Recommendations: See Admitting Provider Note  Report given to:   Additional Notes

## 2023-04-24 NOTE — Procedures (Signed)
[facility]  Routine EEG Report  Video Performed: Performed  Demographics: Patient Name:   Justin Snow, Justin Snow  Date of Birth:   [birth year]  Identification Number:   MRN - [id number]  Study Times:  Duration:   25 minutes  Indication(s): Encephalopathy  Technical Summary:  This EEG was performed utilizing standard International 10-20 System of electrode placement. One channel electrocardiogram was monitored. Data were obtained and interpreted utilizing referential montage recording, with reformatting to longitudinal, transverse bipolar, and referential montages as necessary for interpretation.  State(s):       Awake      Drowsy      Asleep  EKG: Single Lead EKG was Regular HR 60s  Activation Procedures: Hyperventilation: Not performed Photic Stimulation: Not performed   EEG Description:  The awake background is continuous and symmetric characterized primarily by admixed theta delta slowing with an occasional 6-7 Hz posterior dominant rhythm seen. Stage II sleep, contains V waves, by symmetric sleep spindles. No seizures, epileptiform discharges, or evolving ictal patterns seen.  Impression:  Abnormal awake and asleep EEG due to:  1. Mild to moderate generalized slowing of the background consistent with a mild to moderate degree of diffuse or multifocal cerebral dysfunction  2.No seizures, epileptiform discharges, or evolving ictal patterns seen.    Dr Jossie Ng      [facility] For Inpatient follow-up with [facility] physician please call [facility] [phone]. This is not an outpatient service. Post hospital discharge, please contact hospital directly.   Please call or reconsult our service if there are any clinical or diagnostic changes.

## 2023-04-24 NOTE — ED Triage Notes (Signed)
Patient BIB [facility] a [facility] due to possible code stroke. Patient was found laying under his car. EMS reports slurred speech and L sided facial droop. LKW 0000. Patient A&Ox2.

## 2023-04-24 NOTE — ED Notes (Signed)
Patient returned from MRI covered in urine. Pericare provided along with clean linens and gown. Condom cath and drainage bag placed. Patient urinated again and sample collected and sent to lab

## 2023-04-24 NOTE — H&P (Signed)
Neurology Consultation Reason for Consult: Code stroke Requesting Physician: Dione Booze  CC: Altered mental status  History is obtained from: EMS, no family available  HPI: Justin Snow is a 67 y.o. male with a past medical history significant for hypertension, hyperlipidemia, diabetes, smoking, alcohol use history with prior DWIs.  Reportedly his last known well was at midnight but I am unable to confirm this, this is based on gas station attendant seeing patient walking around.  He was subsequently found under his car with altered mental status and slurred speech as well as severe hypertension for which code stroke was activated  LKW: Midnight but unable to definitively confirm this Thrombolytic given?: No, unable to confirm last known well, and concern for L thalamic cavernoma IA performed?: No, or if yes, groin puncture time:  Premorbid modified rankin scale: 1-2 based on chart review reporting baseline dyspnea on exertion and lower extremity edema     1 - No significant disability. Able to carry out all usual activities, despite some symptoms.  ROS: Unable to obtain due to altered mental status.   Past Medical History:  Diagnosis Date   Asthma    Diabetes mellitus without complication (HCC)    History reviewed. No pertinent surgical history.   Family History  Problem Relation Age of Onset   Heart disease Father    History reviewed. No pertinent surgical history.  Current Outpatient Medications  Medication Instructions   albuterol (VENTOLIN HFA) 108 (90 Base) MCG/ACT inhaler Inhalation, Every 6 hours PRN   amLODipine (NORVASC) 10 mg, Oral, Daily   atorvastatin (LIPITOR) 10 mg, Oral, Daily at bedtime   carvedilol (COREG) 6.25 mg, Oral, 2 times daily   DULoxetine (CYMBALTA) 30 mg, Oral, 2 times daily   fluticasone-salmeterol (ADVAIR HFA) 115-21 MCG/ACT inhaler 2 puffs, Inhalation, 2 times daily   furosemide (LASIX) 40 mg, Oral, Daily   gabapentin (NEURONTIN) 400  mg, Oral, 3 times daily   Insulin Syringe-Needle U-100 (INSULIN SYRINGE .5CC/30GX1/2") 30G X 1/2" 0.5 ML MISC Use as directed with insulin.   LANTUS 100 UNIT/ML injection Subcutaneous   lisinopril (ZESTRIL) 40 mg, Oral, Daily   metFORMIN (GLUCOPHAGE) 1,000 mg, Oral, 2 times daily   omeprazole (PRILOSEC) 20 mg, Oral, Every morning   potassium chloride (MICRO-K) 10 MEQ CR capsule 10 mEq, Oral, Daily     Social History:  reports that he has been smoking cigarettes. He has never used smokeless tobacco. He reports current alcohol use. He reports that he does not currently use drugs.   Exam: Current vital signs: BP (!) 113/57   Pulse 65   Temp 97.9 F (36.6 C) (Oral)   Resp (!) 21   Ht 5\' 6"  (1.676 m)   Wt 90.9 kg   SpO2 97%   BMI 32.35 kg/m  Vital signs in last 24 hours: Temp:  [97.5 F (36.4 C)-98 F (36.7 C)] 97.9 F (36.6 C) (07/28 1600) Pulse Rate:  [43-91] 65 (07/28 1900) Resp:  [0-29] 21 (07/28 1900) BP: (108-221)/(51-169) 113/57 (07/28 1900) SpO2:  [87 %-99 %] 97 % (07/28 1900) Weight:  [90.9 kg-93.8 kg] 90.9 kg (07/28 1300)   Physical Exam  Constitutional: No acute distress Psych: Minimally interactive Eyes: No scleral injection HENT: No oropharyngeal obstruction.  Poor dentition MSK: no major joint deformities.  Cardiovascular: Normal rate and regular rhythm. Perfusing extremities well Respiratory: Effort normal, non-labored breathing GI: Soft.  No distension. There is no tenderness.  Skin: Warm dry and intact visible skin  Neuro: Mental Status:  Patient is very somnolent.  Unable to answer orientation questions reliably.   Cranial Nerves: II: Visual Fields are full. Pupils are equal, round, and reactive to light, though the left pupil is slightly brisker than the right pupil.   III,IV, VI: EOMI without ptosis or diploplia.  V: Facial sensation is symmetric to light touch VII: Facial movement is symmetric.  VIII: hearing is intact to voice X: Uvula elevates  symmetrically XI: Shoulder shrug is symmetric. XII: tongue is midline without atrophy or fasciculations.  Motor: Slight drift in the left upper extremity and left lower extremity.  Right lower extremity drifts quickly to the bed.  No drift in the right upper extremity Sensory: Equally reactive to light touch in all 4 extremities Cerebellar: Too sleepy to participate Gait:  Deferred for safety  NIHSS total 8 Score breakdown: 1 point for drowsiness, 1 point for not answering age correctly (initially answered month correctly but later was not able to awaken enough to do this properly) 1 point for left facial droop, 1 point for left upper extremity weakness, 1 point for left lower extremity weakness, 2 points for right lower extremity weakness, 1 point for dysarthria Performed at time of patient arrival to ED.  Late entry of note due to multiple simultaneous code strokes   I have reviewed labs in epic and the results pertinent to this consultation are:  Basic Metabolic Panel: Recent Labs  Lab 04/24/23 0310 04/24/23 0313  NA 138 141  K 3.4* 3.4*  CL 108 108  CO2 20*  --   GLUCOSE 207* 205*  BUN 28* 28*  CREATININE 1.43* 1.50*  CALCIUM 8.7*  --     CBC: Recent Labs  Lab 04/24/23 0310 04/24/23 0313  WBC 9.0  --   NEUTROABS 6.0  --   HGB 12.3* 11.9*  HCT 38.0* 35.0*  MCV 90.9  --   PLT 161  --     Coagulation Studies: Recent Labs    04/24/23 0310  LABPROT 14.1  INR 1.1      I have reviewed the images obtained:  Head CT personally reviewed, agree with radiology no acute intracranial process 1. A 5 mm hyperdense focus of the left thalamus without surrounding edema. 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 2. ASPECTS is 10. 3. 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  CTA personally reviewed, agree with radiology no LVO 1. No emergent large vessel occlusion. 2. 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 3. Severe stenosis at  the right PCA P1-P2 junction. 4. Mild bilateral carotid bifurcation atherosclerosis without hemodynamically significant stenosis.  MRI brain indicates hyderdensity is small acute ICH   Impression: ICH, delay to BP management due to initial impression of cavernoma. Fortunately ICH remained stable in interim between Head CT and MRI confirming underlying etiology of the CT hyperdensity.   Recommendations: # Hemorrhagic stroke, likely hypertensive, less likely hemorrhagic conversion of ischemic stroke or underlying mass - Stroke labs HgbA1c, fasting lipid panel - Consider MRI brain w/ contrast in the future when more cooperative - Stability scan in 6 hours - Frequent neuro checks, q1hr  - Echocardiogram - Carotid dopplers - No antiplatelets due to ICH - DVT PPx heparin at 24 hrs if stable, SCDs for now - Risk factor modification - Telemetry monitoring; 30 day event monitor on discharge if no arrythmias captured  - Blood pressure goal SBP 130-150 clevipridine only due to ICH  - PT consult, OT consult, Speech consult  when patient stabilized  - Stroke team admission - CCM consult due to intermittent bradycardia for close management   Brooke Dare MD-PhD [facility] [phone] Available 7 AM to 7 PM, outside these hours please contact Neurologist on call listed on AMION    Total critical care time: 60 minutes   Critical care time was exclusive of separately billable procedures and treating other patients.   Critical care was necessary to treat or prevent imminent or life-threatening deterioration.   Critical care was time spent personally by me on the following activities: development of treatment plan with patient and/or surrogate as well as nursing, discussions with consultants/primary team, evaluation of patient's response to treatment, examination of patient, obtaining history from patient or surrogate, ordering and performing treatments and interventions, ordering  and review of laboratory studies, ordering and review of radiographic studies, and re-evaluation of patient's condition as needed, as documented above.

## 2023-04-24 NOTE — Consult Note (Deleted)
Neurology Consultation Reason for Consult: Code stroke Requesting Physician: Dione Booze  CC: Altered mental status  History is obtained from: EMS, no family available  HPI: Justin Snow is a 67 y.o. male with a past medical history significant for hypertension, hyperlipidemia, diabetes, smoking, alcohol use history with prior DWIs.  Reportedly his last known well was at midnight but I am unable to confirm this, this is based on gas station attendant seeing patient walking around.  He was subsequently found under his car with altered mental status and slurred speech as well as severe hypertension for which code stroke was activated  LKW: Midnight but unable to definitively confirm this Thrombolytic given?: No, unable to confirm last known well, and concern for L thalamic cavernoma IA performed?: No, or if yes, groin puncture time:  Premorbid modified rankin scale: 1-2 based on chart review reporting baseline dyspnea on exertion and lower extremity edema     1 - No significant disability. Able to carry out all usual activities, despite some symptoms.  ROS: Unable to obtain due to altered mental status.   Past Medical History:  Diagnosis Date   Asthma    Diabetes mellitus without complication (HCC)    History reviewed. No pertinent surgical history.   Family History  Problem Relation Age of Onset   Heart disease Father    History reviewed. No pertinent surgical history.  Current Outpatient Medications  Medication Instructions   albuterol (VENTOLIN HFA) 108 (90 Base) MCG/ACT inhaler Inhalation, Every 6 hours PRN   amLODipine (NORVASC) 10 mg, Oral, Daily   atorvastatin (LIPITOR) 10 mg, Oral, Daily at bedtime   carvedilol (COREG) 6.25 mg, Oral, 2 times daily   DULoxetine (CYMBALTA) 30 mg, Oral, 2 times daily   fluticasone-salmeterol (ADVAIR HFA) 115-21 MCG/ACT inhaler 2 puffs, Inhalation, 2 times daily   furosemide (LASIX) 40 mg, Oral, Daily   gabapentin (NEURONTIN) 400  mg, Oral, 3 times daily   Insulin Syringe-Needle U-100 (INSULIN SYRINGE .5CC/30GX1/2") 30G X 1/2" 0.5 ML MISC Use as directed with insulin.   LANTUS 100 UNIT/ML injection Inject into the skin.   lisinopril (ZESTRIL) 40 mg, Oral, Daily   metFORMIN (GLUCOPHAGE) 1,000 mg, Oral, 2 times daily   omeprazole (PRILOSEC) 20 mg, Oral, Every morning   potassium chloride (MICRO-K) 10 MEQ CR capsule 10 mEq, Oral, Daily     Social History:  reports that he has been smoking cigarettes. He has never used smokeless tobacco. He reports current alcohol use. He reports that he does not currently use drugs.   Exam: Current vital signs: BP (!) 154/82   Pulse 71   Temp (!) 97.5 F (36.4 C) (Oral)   Resp 18   Ht 5\' 6"  (1.676 m)   Wt 93.8 kg   SpO2 96%   BMI 33.38 kg/m  Vital signs in last 24 hours: Temp:  [97.5 F (36.4 C)] 97.5 F (36.4 C) (07/28 0308) Pulse Rate:  [60-84] 71 (07/28 0515) Resp:  [0-19] 18 (07/28 0515) BP: (154-221)/(66-169) 154/82 (07/28 0515) SpO2:  [87 %-98 %] 96 % (07/28 0515) Weight:  [93.8 kg] 93.8 kg (07/28 0325)   Physical Exam  Constitutional: No acute distress Psych: Minimally interactive Eyes: No scleral injection HENT: No oropharyngeal obstruction.  Poor dentition MSK: no major joint deformities.  Cardiovascular: Normal rate and regular rhythm. Perfusing extremities well Respiratory: Effort normal, non-labored breathing GI: Soft.  No distension. There is no tenderness.  Skin: Warm dry and intact visible skin  Neuro: Mental Status: Patient  is very somnolent.  Unable to answer orientation questions reliably.   Cranial Nerves: II: Visual Fields are full. Pupils are equal, round, and reactive to light, though the left pupil is slightly brisker than the right pupil.   III,IV, VI: EOMI without ptosis or diploplia.  V: Facial sensation is symmetric to light touch VII: Facial movement is symmetric.  VIII: hearing is intact to voice X: Uvula elevates  symmetrically XI: Shoulder shrug is symmetric. XII: tongue is midline without atrophy or fasciculations.  Motor: Slight drift in the left upper extremity and left lower extremity.  Right lower extremity drifts quickly to the bed.  No drift in the right upper extremity Sensory: Equally reactive to light touch in all 4 extremities Cerebellar: Too sleepy to participate Gait:  Deferred for safety  NIHSS total 8 Score breakdown: 1 point for drowsiness, 1 point for not answering age correctly (initially answered month correctly but later was not able to awaken enough to do this properly) 1 point for left facial droop, 1 point for left upper extremity weakness, 1 point for left lower extremity weakness, 2 points for right lower extremity weakness, 1 point for dysarthria Performed at time of patient arrival to ED.  Late entry of note due to multiple simultaneous code strokes   I have reviewed labs in epic and the results pertinent to this consultation are:  Basic Metabolic Panel: Recent Labs  Lab 04/24/23 0310 04/24/23 0313  NA 138 141  K 3.4* 3.4*  CL 108 108  CO2 20*  --   GLUCOSE 207* 205*  BUN 28* 28*  CREATININE 1.43* 1.50*  CALCIUM 8.7*  --     CBC: Recent Labs  Lab 04/24/23 0310 04/24/23 0313  WBC 9.0  --   NEUTROABS 6.0  --   HGB 12.3* 11.9*  HCT 38.0* 35.0*  MCV 90.9  --   PLT 161  --     Coagulation Studies: Recent Labs    04/24/23 0310  LABPROT 14.1  INR 1.1      I have reviewed the images obtained:  Head CT personally reviewed, agree with radiology no acute intracranial process 1. A 5 mm hyperdense focus of the left thalamus without surrounding edema. 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 2. ASPECTS is 10. 3. 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  CTA personally reviewed, agree with radiology no LVO 1. No emergent large vessel occlusion. 2. 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 3. Severe stenosis at  the right PCA P1-P2 junction. 4. Mild bilateral carotid bifurcation atherosclerosis without hemodynamically significant stenosis.  MRI brain indicates hyderdensity is small acute ICH   Impression: ICH, delay to BP management due to initial impression of cavernoma. Fortunately ICH remained stable in interim between Head CT and MRI confirming underlying etiology of the CT hyperdensity.   Recommendations: # Hemorrhagic stroke, likely hypertensive, less likely hemorrhagic conversion of ischemic stroke or underlying mass - Stroke labs HgbA1c, fasting lipid panel - Consider MRI brain w/ contrast in the future when more cooperative - Stability scan in 6 hours - Frequent neuro checks, q1hr  - Echocardiogram - Carotid dopplers - No antiplatelets due to ICH - DVT PPx heparin at 24 hrs if stable, SCDs for now - Risk factor modification - Telemetry monitoring; 30 day event monitor on discharge if no arrythmias captured  - Blood pressure goal SBP 130-150 clevipridine only due to ICH  - PT consult, OT consult, Speech consult when  patient stabilized  - Stroke team admission - CCM consult due to intermittent bradycardia for close management   Brooke Dare MD-PhD [facility] [phone] Available 7 AM to 7 PM, outside these hours please contact Neurologist on call listed on AMION    Total critical care time: 60 minutes   Critical care time was exclusive of separately billable procedures and treating other patients.   Critical care was necessary to treat or prevent imminent or life-threatening deterioration.   Critical care was time spent personally by me on the following activities: development of treatment plan with patient and/or surrogate as well as nursing, discussions with consultants/primary team, evaluation of patient's response to treatment, examination of patient, obtaining history from patient or surrogate, ordering and performing treatments and interventions, ordering  and review of laboratory studies, ordering and review of radiographic studies, and re-evaluation of patient's condition as needed, as documented above.

## 2023-04-25 DIAGNOSIS — G9340 Encephalopathy, unspecified: Secondary | ICD-10-CM | POA: Diagnosis not present

## 2023-04-25 DIAGNOSIS — I10 Essential (primary) hypertension: Secondary | ICD-10-CM

## 2023-04-25 DIAGNOSIS — I610 Nontraumatic intracerebral hemorrhage in hemisphere, subcortical: Secondary | ICD-10-CM | POA: Diagnosis not present

## 2023-04-25 LAB — GLUCOSE, CAPILLARY
Glucose-Capillary: 136 mg/dL — ABNORMAL HIGH (ref 70–99)
Glucose-Capillary: 156 mg/dL — ABNORMAL HIGH (ref 70–99)
Glucose-Capillary: 147 mg/dL — ABNORMAL HIGH (ref 70–99)
Glucose-Capillary: 133 mg/dL — ABNORMAL HIGH (ref 70–99)
Glucose-Capillary: 161 mg/dL — ABNORMAL HIGH (ref 70–99)

## 2023-04-25 MED ORDER — HYDRALAZINE HCL 20 MG/ML IJ SOLN: 10.0000 mg | INTRAMUSCULAR | Status: DC | PRN

## 2023-04-25 MED ORDER — HYDROCHLOROTHIAZIDE 12.5 MG PO TABS
12.5000 mg | ORAL_TABLET | Freq: Every day | ORAL | Status: DC
  Administered 2023-04-25 – 2023-04-28 (×4): 12.5 mg via ORAL
  Filled 2023-04-25 (×4): qty 1

## 2023-04-25 MED ORDER — LABETALOL HCL 5 MG/ML IV SOLN: 10.0000 mg | INTRAVENOUS | Status: DC | PRN

## 2023-04-25 MED ORDER — HYDRALAZINE HCL 20 MG/ML IJ SOLN
10.0000 mg | INTRAMUSCULAR | Status: DC | PRN
  Administered 2023-04-25: 10 mg via INTRAVENOUS
  Filled 2023-04-25: qty 1

## 2023-04-25 NOTE — Evaluation (Addendum)
Physical Therapy Evaluation Patient Details Name: Justin Snow MRN: [id number] DOB: 1955-10-01 Today's Date: 04/25/2023  History of Present Illness  Pt is a 67 y.o. M who presents 04/24/2023 after being found under his car with altered mental status and slurred speech. Found to have severe HTN for which code stroke was activated. While in ED, pt found to have episodes of bradycardia with no other symptoms associated. 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 Significant PMH: HTN, HLD, diabetes, smoking, alcohol use history.  Clinical Impression  PTA, pt states he lives alone in a mobile home and is independent. No focal deficits appreciated on PT evaluation. Pt able to perform LB dressing and ambulating 250 ft with no assistive device without physical assist. HR 48-79 bpm, SpO2 92% on RA. Pt reports BLE fatigue towards end of walk; likely somewhat sedentary at baseline per pt report. Will continue to follow acutely; don't anticipate need for PT follow up.      If plan is discharge home, recommend the following: Assist for transportation   Can travel by private vehicle        Equipment Recommendations None recommended by PT  Recommendations for Other Services       Functional Status Assessment Patient has had a recent decline in their functional status and demonstrates the ability to make significant improvements in function in a reasonable and predictable amount of time.     Precautions / Restrictions Precautions Precautions: Fall Restrictions Weight Bearing Restrictions: No      Mobility  Bed Mobility Overal bed mobility: Modified Independent                  Transfers Overall transfer level: Independent Equipment used: None                    Ambulation/Gait Ambulation/Gait assistance: Supervision, Min guard Gait Distance (Feet): 250 Feet Assistive device: None Gait  Pattern/deviations: Step-through pattern, Decreased stride length Gait velocity: decreased     General Gait Details: Slightly unsteady initially, but progressing to supervision level. Reports it feels like he's "walking on gravel," with BLE fatigue with increased distance  Stairs            Wheelchair Mobility     Tilt Bed    Modified Rankin (Stroke Patients Only) Modified Rankin (Stroke Patients Only) Pre-Morbid Rankin Score: No symptoms Modified Rankin: Moderately severe disability     Balance Overall balance assessment: Mild deficits observed, not formally tested                                           Pertinent Vitals/Pain Pain Assessment Pain Assessment: Faces Faces Pain Scale: Hurts a little bit Pain Location: condom cath site Pain Descriptors / Indicators: Discomfort Pain Intervention(s): Monitored during session    Home Living Family/patient expects to be discharged to:: Private residence Living Arrangements: Alone   Type of Home: Mobile home Home Access: Stairs to enter   Secretary/administrator of Steps: 1-2   Home Layout: One level        Prior Function Prior Level of Function : Independent/Modified Independent             Mobility Comments: Pt vague regarding PLOF, does not work       Higher education careers adviser        Extremity/Trunk Assessment  Upper Extremity Assessment Upper Extremity Assessment: Defer to OT evaluation    Lower Extremity Assessment Lower Extremity Assessment: RLE deficits/detail;LLE deficits/detail RLE Deficits / Details: Strength 5/5 RLE Sensation: history of peripheral neuropathy (per pt) LLE Deficits / Details: Strength 5/5 LLE Sensation: history of peripheral neuropathy (per pt)    Cervical / Trunk Assessment Cervical / Trunk Assessment: Normal  Communication   Communication: HOH  Cognition Arousal/Alertness: Awake/alert Behavior During Therapy: Flat affect Overall Cognitive Status:  Impaired/Different from baseline Area of Impairment: Orientation, Memory, Awareness                 Orientation Level: Disoriented to, Time   Memory: Decreased short-term memory     Awareness: Emergent   General Comments: Not oriented to time with RN, stating month was September and year was 2012. Pt is HOH, but follows multi step commands. ? using humor to mask any cognitive deficits. States he doesn't drive, but then later when asked how he gets around he says, "I drive."        General Comments      Exercises     Assessment/Plan    PT Assessment Patient needs continued PT services  PT Problem List Decreased activity tolerance;Decreased balance;Decreased mobility;Decreased cognition;Decreased safety awareness       PT Treatment Interventions Gait training;Stair training;Functional mobility training;Therapeutic activities;Therapeutic exercise;Balance training;Patient/family education    PT Goals (Current goals can be found in the Care Plan section)  Acute Rehab PT Goals Patient Stated Goal: did not state PT Goal Formulation: With patient Time For Goal Achievement: 05/09/23 Potential to Achieve Goals: Good    Frequency Min 1X/week     Co-evaluation               AM-PAC PT "6 Clicks" Mobility  Outcome Measure Help needed turning from your back to your side while in a flat bed without using bedrails?: None Help needed moving from lying on your back to sitting on the side of a flat bed without using bedrails?: None Help needed moving to and from a bed to a chair (including a wheelchair)?: A Little Help needed standing up from a chair using your arms (e.g., wheelchair or bedside chair)?: A Little Help needed to walk in hospital room?: A Little Help needed climbing 3-5 steps with a railing? : A Little 6 Click Score: 20    End of Session Equipment Utilized During Treatment: Gait belt Activity Tolerance: Patient tolerated treatment well Patient left: Other  (comment) (in bathroom with OT) Nurse Communication: Mobility status PT Visit Diagnosis: Unsteadiness on feet (R26.81)    Time: 0865-7846 PT Time Calculation (min) (ACUTE ONLY): 18 min   Charges:   PT Evaluation $PT Eval Moderate Complexity: 1 Mod   PT General Charges $$ ACUTE PT VISIT: 1 Visit         Lillia Pauls, PT, DPT Acute Rehabilitation Services Office [phone]   Norval Morton 04/25/2023, 9:39 AM

## 2023-04-25 NOTE — Evaluation (Addendum)
Occupational Therapy Evaluation Patient Details Name: Justin Snow MRN: [id number] DOB: [birth year] Today's Date: 04/25/2023   History of Present Illness Pt is a 67 y.o. M who presents 04/24/2023 after being found under his car with altered mental status and slurred speech. Found to have severe HTN for which code stroke was activated. While in ED, pt found to have episodes of bradycardia with no other symptoms associated. 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 Significant PMH: HTN, HLD, diabetes, smoking, alcohol use history.   Clinical Impression   PTA patient independent with ADLs, IADLs.  Admitted for above and presents with problem list below.  He is disoriented to time, but follows commands well.  He requires cueing for problem solving and safety awareness, would benefit from further cognitive assessment.  He is able to complete ADLs with setup to max assist, transfers with supervision and mobility in room with min guard to close supervision. Based on performance today, believe  he will best benefit from continued OT services acutely and after dc at [facility] level to optimize independence, safety and return to PLOF.  At this time would recommend assistance with driving, meds, finances and meals.      Recommendations for follow up therapy are one component of a multi-disciplinary discharge planning process, led by the attending physician.  Recommendations may be updated based on patient status, additional functional criteria and insurance authorization.   Assistance Recommended at Discharge Frequent or constant Supervision/Assistance  Patient can return home with the following A little help with walking and/or transfers;A lot of help with bathing/dressing/bathroom;Assistance with cooking/housework;Direct supervision/assist for medications management;Direct supervision/assist for financial management;Assist for transportation     Functional Status Assessment  Patient has had a recent decline in their functional status and demonstrates the ability to make significant improvements in function in a reasonable and predictable amount of time.  Equipment Recommendations  BSC/3in1    Recommendations for Other Services       Precautions / Restrictions Precautions Precautions: Fall Restrictions Weight Bearing Restrictions: No      Mobility Bed Mobility               General bed mobility comments: OOB in recliner    Transfers Overall transfer level: Needs assistance   Transfers: Sit to/from Stand Sit to Stand: Supervision                  Balance Overall balance assessment: Mild deficits observed, not formally tested                                         ADL either performed or assessed with clinical judgement   ADL Overall ADL's : Needs assistance/impaired     Grooming: Min guard;Standing           Upper Body Dressing : Set up;Sitting   Lower Body Dressing: Min guard;Sit to/from stand   Toilet Transfer: Min guard;Ambulation   Toileting- Clothing Manipulation and Hygiene: Maximal assistance;Sit to/from stand Toileting - Clothing Manipulation Details (indicate cue type and reason): bowel hygiene assist     Functional mobility during ADLs: Min guard       Vision Patient Visual Report: No change from baseline       Perception     Praxis      Pertinent Vitals/Pain Pain Assessment Pain Assessment: Faces Faces Pain Scale:  Hurts a little bit Pain Location: condom cath site Pain Descriptors / Indicators: Discomfort Pain Intervention(s): Limited activity within patient's tolerance, Monitored during session, Repositioned     Hand Dominance Right   Extremity/Trunk Assessment Upper Extremity Assessment Upper Extremity Assessment: Generalized weakness   Lower Extremity Assessment Lower Extremity Assessment: Defer to PT evaluation RLE Deficits /  Details: Strength 5/5 RLE Sensation: history of peripheral neuropathy (per pt) LLE Deficits / Details: Strength 5/5 LLE Sensation: history of peripheral neuropathy (per pt)   Cervical / Trunk Assessment Cervical / Trunk Assessment: Normal   Communication Communication Communication: HOH   Cognition Arousal/Alertness: Awake/alert Behavior During Therapy: Flat affect Overall Cognitive Status: Impaired/Different from baseline Area of Impairment: Orientation, Memory, Awareness, Safety/judgement, Problem solving                 Orientation Level: Disoriented to, Time   Memory: Decreased short-term memory   Safety/Judgement: Decreased awareness of safety Awareness: Emergent Problem Solving: Slow processing, Requires verbal cues General Comments: pt oriented to month and year but not date. He follows simple commands but demonstrates decreased awareness of deficits. Poor awareness of safety at baseline, reporting he drives but hes not supposed to     General Comments       Exercises     Shoulder Instructions      Home Living Family/patient expects to be discharged to:: Private residence Living Arrangements: Alone Available Help at Discharge: Friend(s);Family;Available PRN/intermittently Type of Home: Mobile home Home Access: Stairs to enter Entrance Stairs-Number of Steps: 1-2   Home Layout: One level               Home Equipment: None          Prior Functioning/Environment Prior Level of Function : Independent/Modified Independent             Mobility Comments: Pt vague regarding PLOF, does not work ADLs Comments: pt reports independent with ADLs, IADLs  (reports driving although he shouldnt be)        OT Problem List: Decreased strength;Impaired balance (sitting and/or standing);Decreased cognition;Decreased safety awareness;Decreased knowledge of precautions;Decreased knowledge of use of DME or AE      OT Treatment/Interventions: Self-care/ADL  training;Energy conservation;Therapeutic exercise;Therapeutic activities;Cognitive remediation/compensation;Balance training;Patient/family education;DME and/or AE instruction    OT Goals(Current goals can be found in the care plan section) Acute Rehab OT Goals Patient Stated Goal: home OT Goal Formulation: With patient Time For Goal Achievement: 05/09/23 Potential to Achieve Goals: Good ADL Goals Pt Will Perform Grooming: Independently;standing Pt Will Perform Lower Body Dressing: with modified independence;sit to/from stand Pt Will Transfer to Toilet: with modified independence;ambulating Pt Will Perform Toileting - Clothing Manipulation and hygiene: with modified independence;sit to/from stand Additional ADL Goal #1: Pt will complete pill box test with less than 3 errors. Additional ADL Goal #2: Pt will complete 4 step trail making task with increased time and indepedence.  OT Frequency: Min 1X/week    Co-evaluation              AM-PAC OT "6 Clicks" Daily Activity     Outcome Measure Help from another person eating meals?: None Help from another person taking care of personal grooming?: A Little Help from another person toileting, which includes using toliet, bedpan, or urinal?: A Lot Help from another person bathing (including washing, rinsing, drying)?: A Little Help from another person to put on and taking off regular upper body clothing?: A Little Help from another person to put on and taking off  regular lower body clothing?: A Little 6 Click Score: 18   End of Session Equipment Utilized During Treatment: Gait belt Nurse Communication: Mobility status  Activity Tolerance: Patient tolerated treatment well Patient left: in chair;with call bell/phone within reach;with chair alarm set;with nursing/sitter in room  OT Visit Diagnosis: Other symptoms and signs involving cognitive function;Other abnormalities of gait and mobility (R26.89)                Time: 6295-2841 OT  Time Calculation (min): 24 min Charges:  OT General Charges $OT Visit: 1 Visit OT Evaluation $OT Eval Moderate Complexity: 1 Mod OT Treatments $Self Care/Home Management : 8-22 mins  Barry Brunner, OT Acute Rehabilitation Services Office [phone]   Chancy Milroy 04/25/2023, 1:13 PM

## 2023-04-25 NOTE — Evaluation (Signed)
Speech Language Pathology Evaluation Patient Details Name: Justin Snow MRN: [id number] DOB: 07/06/1956 Today's Date: 04/25/2023 Time: 0102-7253 SLP Time Calculation (min) (ACUTE ONLY): 14 min  Problem List:  Patient Active Problem List   Diagnosis Date Noted   Acute encephalopathy 04/24/2023   ICH (intracerebral hemorrhage) (HCC) 04/24/2023   Cerebrovascular accident (CVA) (HCC) 04/24/2023   Mixed hyperlipidemia 07/31/2020   Essential hypertension 06/27/2020   Tobacco dependence 06/27/2020   Bruit of left carotid artery 06/27/2020   Bilateral leg edema 06/27/2020   Past Medical History:  Past Medical History:  Diagnosis Date   Asthma    Diabetes mellitus without complication (HCC)    Past Surgical History: History reviewed. No pertinent surgical history. HPI:  Pt is a 67 y.o. M who presents 04/24/2023 after being found under his car with altered mental status and slurred speech. Found to have severe HTN for which code stroke was activated. While in ED, pt found to have episodes of bradycardia with no other symptoms associated. MRI showing small acute left thalamic hemorrhage and positive for small acute right white matter lacunar infarct. Significant PMH: HTN, HLD, diabetes, smoking, alcohol use history.   Assessment / Plan / Recommendation Clinical Impression  Cognitive linguistic evaluation complete. Patient presents with deficits in the areas of intellectual awareness, short term memory, reasoning and safety awareness. He has subtle dysarthria due to mild left sided facial weakkness impacting lips. Unclear baseline. He will benefit from SLP f/u to address above areas.    SLP Assessment  SLP Recommendation/Assessment: Patient needs continued Speech Lanaguage Pathology Services SLP Visit Diagnosis: Dysarthria and anarthria (R47.1);Frontal lobe and executive function deficit Frontal lobe and executive function deficit following: Cerebral infarction    Recommendations for  follow up therapy are one component of a multi-disciplinary discharge planning process, led by the attending physician.  Recommendations may be updated based on patient status, additional functional criteria and insurance authorization.    Follow Up Recommendations  Outpatient SLP    Assistance Recommended at Discharge  Frequent or constant Supervision/Assistance  Functional Status Assessment Patient has had a recent decline in their functional status and demonstrates the ability to make significant improvements in function in a reasonable and predictable amount of time.  Frequency and Duration min 2x/week  2 weeks      SLP Evaluation Cognition  Overall Cognitive Status: No family/caregiver present to determine baseline cognitive functioning Arousal/Alertness: Awake/alert Orientation Level: Oriented to person;Oriented to place;Oriented to time;Oriented to situation Memory: Impaired Memory Impairment: Retrieval deficit Awareness: Impaired Awareness Impairment: Intellectual impairment;Emergent impairment Problem Solving: Impaired Problem Solving Impairment: Verbal complex;Functional complex Executive Function: Reasoning Reasoning: Impaired Reasoning Impairment: Functional complex;Verbal complex Behaviors: Other (comment) (inappropriate with clinician at times) Safety/Judgment: Impaired (based on questioning only) Comments: getting out of chair on own, unable to recall catheter in place       Comprehension  Auditory Comprehension Overall Auditory Comprehension: Appears within functional limits for tasks assessed Visual Recognition/Discrimination Discrimination: Not tested Reading Comprehension Reading Status: Not tested    Expression Expression Primary Mode of Expression: Verbal Verbal Expression Overall Verbal Expression: Appears within functional limits for tasks assessed Written Expression Dominant Hand: Right   Oral / Motor  Oral Motor/Sensory Function Overall Oral  Motor/Sensory Function: Mild impairment Facial ROM: Reduced left;Suspected CN VII (facial) dysfunction Facial Symmetry: Abnormal symmetry left;Suspected CN VII (facial) dysfunction Facial Strength: Reduced left;Suspected CN VII (facial) dysfunction Facial Sensation: Within Functional Limits Lingual ROM: Within Functional Limits Lingual Symmetry: Within Functional Limits Lingual Strength: Within  Functional Limits Lingual Sensation: Within Functional Limits Mandible: Within Functional Limits Motor Speech Overall Motor Speech: Impaired Respiration: Within functional limits Phonation: Normal Resonance: Within functional limits Articulation: Impaired Level of Impairment: Sentence Intelligibility: Intelligibility reduced Word: 75-100% accurate Phrase: 75-100% accurate Sentence: 75-100% accurate Conversation: 75-100% accurate Motor Planning: Witnin functional limits           [employer] MA, CCC-SLP    Justin Snow 04/25/2023, 1:28 PM

## 2023-04-25 NOTE — Progress Notes (Addendum)
STROKE TEAM PROGRESS NOTE   BRIEF HPI Mr. Justin Snow is a 67 y.o. male with history significant for essential hypertension, hyperlipidemia, type 2 diabetes mellitus, smoking, alcohol use history presenting with altered mental status after a gas station attendant witnessed the patient walking around prior to being found lying under his car confused with left facial droop and slurred speech.  While in the ED, patient had significant hypertension with SBP in the 220s.  Last known well time was reported as midnight but unable to be confirmed.  SIGNIFICANT HOSPITAL EVENTS 7/28: Admitted for AMS, unclear LKW, hypertensive requiring cleviprex, small acute left thalamic hemorrhage on imaging, small acute white matter lacunar infarct  INTERIM HISTORY/SUBJECTIVE Patient is seen in his room with no family at the bedside.  He has required as needed BP medications to maintain blood pressure within goal, and Cleviprex is now off.  His neurological exam is improved, and he is more alert today.  Will plan to transfer out of the ICU.  OBJECTIVE  CBC    Component Value Date/Time   WBC 9.0 04/24/2023 0310   RBC 4.18 (L) 04/24/2023 0310   HGB 11.9 (L) 04/24/2023 0313   HCT 35.0 (L) 04/24/2023 0313   PLT 161 04/24/2023 0310   MCV 90.9 04/24/2023 0310   MCH 29.4 04/24/2023 0310   MCHC 32.4 04/24/2023 0310   RDW 14.0 04/24/2023 0310   LYMPHSABS 1.8 04/24/2023 0310   MONOABS 0.8 04/24/2023 0310   EOSABS 0.3 04/24/2023 0310   BASOSABS 0.1 04/24/2023 0310   BMET    Component Value Date/Time   NA 139 04/25/2023 0451   K 3.4 (L) 04/25/2023 0451   CL 108 04/25/2023 0451   CO2 20 (L) 04/25/2023 0451   GLUCOSE 118 (H) 04/25/2023 0451   BUN 22 04/25/2023 0451   CREATININE 1.36 (H) 04/25/2023 0451   CALCIUM 8.6 (L) 04/25/2023 0451   GFRNONAA 57 (L) 04/25/2023 0451   No results found for: "HGBA1C" Lab Results  Component Value Date   CHOL 138 04/24/2023   HDL 35 (L) 04/24/2023   LDLCALC 77  04/24/2023   TRIG 132 04/24/2023   CHOLHDL 3.9 04/24/2023    Drugs of Abuse     Component Value Date/Time   LABOPIA NONE DETECTED 04/24/2023 0639   COCAINSCRNUR NONE DETECTED 04/24/2023 0639   LABBENZ NONE DETECTED 04/24/2023 0639   AMPHETMU NONE DETECTED 04/24/2023 0639   THCU NONE DETECTED 04/24/2023 0639   LABBARB NONE DETECTED 04/24/2023 0639    Alcohol Level    Component Value Date/Time   ETH <10 04/24/2023 0310   IMAGING past 24 hours No results found.  Vitals:   04/25/23 0845 04/25/23 0908 04/25/23 1000 04/25/23 1200  BP: (!) 148/74 (!) 158/66 (!) 131/97   Pulse: 70 68 66   Resp: (!) 21 16 (!) 23   Temp:    98.2 F (36.8 C)  TempSrc:    Oral  SpO2: 96% 94% 91%   Weight:      Height:       PHYSICAL EXAM General:  Alert, well-nourished, well-developed Caucasian male patient in no acute distress Psych: Affect is normal and appropriate to situation CV: Regular rate and rhythm on monitor Respiratory:  Regular, unlabored respirations on room air GI: Abdomen soft and nontender   NEURO:  Mental Status: Patient is alert and oriented to person place time and situation, but still not able to give much history of the events that led up to his hospitalization  Cranial Nerves:  II: PERRL. Visual fields full.  III, IV, VI: EOMI.  V: Sensation is intact to light touch and symmetrical to face.  VII: Left facial weakness is noted VIII: Hearing intact to voice. IX, X:  Phonation is normal.  QQ:[id number] shrug 5/5. XII: Tongue protrudes midline Motor: Limited cooperation with confrontational strength testing. Patient does have some left grip weakness, left arm weakness compared to the right.  Diminished fine finger movements on the left with right hand orbiting left Sensation: Patient reports equal sensation to light touch bilaterally Coordination: FTN intact bilaterally Gait: Deferred for patient safety  ASSESSMENT/PLAN  Intracerebral Hemorrhage: Small acute left  thalamic hemorrhage, likely hypertensive in etiology versus less likely hemorrhagic transformation of ischemic infarct. Small acute lacunar white matter infarct in the right centrum semiovale due to small vessel disease CT head 7/28: 5 mm hyperdense focus in the left thalamus without surrounding edema. Aspects is 10.  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 CTA head & neck 7/28: No emergent LVO.  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  Mild bilateral carotid bifurcation atherosclerosis without hemodynamically significant stenosis. MRI 7/28: Truncated and mildly motion degraded noncontrast MRI.  Small acute left thalamic hemorrhage superimposed on evidence of extensive chronic microhemorrhages in the deep gray nuclei.  Small acute white matter lacunar infarct in the right centrum semiovale.  No associated hemorrhage or mass effect.  Underlying advanced chronic small vessel disease. EEG mild to moderate generalized slowing. 2D Echo EF 60 to 65%, grade 2 diastolic dysfunction, mildly dilated left atrium, interatrial septum not able to be assessed LDL 77 HgbA1c No results found for requested labs within last 1095 days. 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 identified on CT head imaging 04/24/2023 MRI brain 04/24/2023 with evidence of numerous lacunar infarcts in addition to the evidence of blood products with occasional brainstem chronic lacunar infarcts Patient with no reported or documented history of strokes or previous stroke symptoms  Hypertension Home meds:  amlodipine, carvedilol  Cleviprex drip now off PRN labetalol  Blood Pressure Goal: SBP between 130-150 for 24 hours and then less than 160   Hyperlipidemia Home meds:   atorvastatin 10 mg LDL 77., goal < 70 Begin high intensity statin at discharge  Diabetes type II Uncontrolled Home meds:  Metformin, lantus  HgbA1c No results found for requested labs within last 1095 days., goal < 7.0 CBGs SSI Recommend close follow-up with PCP for better DM control  Tobacco Abuse Patient smokes 1 packs per day for many years      Ready to quit? 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 Yes   Advance diet as tolerated  Other Stroke Risk Factors Obesity, Body mass index is 32.35 kg/m., BMI >/= 30 associated with increased stroke risk, recommend weight loss, diet and exercise as appropriate   Other Active Problems History of alcohol use Serum EtOH without evidence of intoxication on arrival Patient denies recent alcohol use  Hospital day # 1  Cortney E Ernestina Columbia , MSN, AGACNP-BC [facility] See Amion for schedule and pager information 04/25/2023 12:24 PM  I have personally obtained history,examined this patient, reviewed notes, independently viewed imaging studies, participated in medical decision making and plan of care.ROS completed by me personally and pertinent positives fully documented  I have made any additions or clarifications directly to the above note. Agree with note above.  Patient neurological exam is much improved.  Recommend mobilize out of bed.  Strict control of hypertension with systolic blood pressure goal below 160.  Resume all blood pressure medications and use as needed IV hydralazine.  Mobilize out of bed therapy consults.  Transfer to neurology floor bed when available.  No family available at the bedside for discussion.  Discussed with Dr.  Merrily Pew critical care medicine.  Greater than 50% time during this 50-minute visit was spent in counseling and coordination of care about his thalamic hemorrhage and lacunar stroke discussion about evaluation and treatment and answering questions.  Delia Heady, MD Medical Director [facility] Pager: [phone] 04/25/2023 3:19 PM   To contact Stroke Continuity provider, please refer to [email or web address]. After hours, contact General Neurology

## 2023-04-25 NOTE — Progress Notes (Signed)
   NAME:  Justin Snow, MRN:  [id number], DOB:  Jul 05, 1956, LOS: 1 ADMISSION DATE:  04/24/2023, CONSULTATION DATE: 04/24/2023 REFERRING MD: [facility], CHIEF COMPLAINT: Bradycardia  History of Present Illness:  67 year old man.  Poor historian. He was found underneath his car outside a gas station.  Last known well was 3 hours prior.  He was treated as a code stroke due to slurred speech and altered mental status. Workup in the ED was negative for large vessel occlusion.  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  While in the ED he was found to have episodes of bradycardia with no other symptoms associated.  On further questioning, the patient denies any cardiac symptomatology and has not had episodes of bradycardia to his knowledge in the past.  Deviously seen by [facility] for hypertension and leg edema and management of cardiovascular risk factors.  No established coronary disease.  Pertinent  Medical History   Past Medical History:  Diagnosis Date   Asthma    Diabetes mellitus without complication (HCC)     Significant Hospital Events: Including procedures, antibiotic start and stop dates in addition to other pertinent events   7/28-admitted to hospital.  Interim History / Subjective:  Became bradycardic after labetalol and Coreg Currently heart rate in 70s Denies any complaint  Objective   Blood pressure (!) 161/87, pulse 67, temperature 97.9 F (36.6 C), temperature source Oral, resp. rate 15, height 5\' 6"  (1.676 m), weight 90.9 kg, SpO2 97%.    FiO2 (%):  [28 %] 28 %   Intake/Output Summary (Last 24 hours) at 04/25/2023 0759 Last data filed at 04/25/2023 0600 Gross per 24 hour  Intake 1341.18 ml  Output 815 ml  Net 526.18 ml   Filed Weights   04/24/23 0309 04/24/23 0325 04/24/23 1300  Weight: 93.8 kg 93.8 kg 90.9 kg    Examination: General: Elderly male, lying on the bed HEENT: [place]/AT, eyes anicteric.  moist mucus  membranes Neuro: Alert, awake following commands, left facial droop, antigravity in all 4 extremities Chest: Coarse breath sounds, no wheezes or rhonchi Heart: Regular rate and rhythm, no murmurs or gallops Abdomen: Soft, nontender, nondistended, bowel sounds present Skin: No rash  Labs and images were reviewed  Assessment & Plan:  Nonspecific sinus bradycardia now improved, no evidence of conduction block  Small left thalamic intraparenchymal  ICH score 1 Poorly controlled hypertension Chronic HFpEF Type 2 diabetes CKD stage IIIa COPD by history Tobacco dependence  Bradycardia has improved Avoid AV nodal blocking agents Coreg was switched to HCTZ Continue amlodipine, lisinopril and hydrochlorothiazide Titrate Cleviprex Continue neuro watch Echocardiogram showed normal EF with grade 2 diastolic dysfunction Monitor intake and output Continue sliding scale insulin with CBG goal 140-180 Serum creatinine at baseline Outpatient follow-up with pulmonary Counseling provided regarding quit smoking, patient refused to quit    Best Practice (right click and "Reselect all SmartList Selections" daily)   Diet/type: Regular consistency (see orders) DVT prophylaxis: SCD GI prophylaxis: N/A Lines: N/A Foley:  N/A Code Status:  full code Last date of multidisciplinary goals of care discussion [patient updated at bedside.]   Cheri Fowler, MD [facility] See Amion for pager If no response to pager, please call [phone] until 7pm After 7pm, Please call E-link [phone]

## 2023-04-26 DIAGNOSIS — G9340 Encephalopathy, unspecified: Secondary | ICD-10-CM | POA: Diagnosis not present

## 2023-04-26 LAB — GLUCOSE, CAPILLARY
Glucose-Capillary: 108 mg/dL — ABNORMAL HIGH (ref 70–99)
Glucose-Capillary: 162 mg/dL — ABNORMAL HIGH (ref 70–99)
Glucose-Capillary: 203 mg/dL — ABNORMAL HIGH (ref 70–99)
Glucose-Capillary: 205 mg/dL — ABNORMAL HIGH (ref 70–99)
Glucose-Capillary: 233 mg/dL — ABNORMAL HIGH (ref 70–99)
Glucose-Capillary: 242 mg/dL — ABNORMAL HIGH (ref 70–99)

## 2023-04-26 MED ORDER — NICOTINE POLACRILEX 2 MG MT GUM
2.0000 mg | CHEWING_GUM | OROMUCOSAL | Status: DC | PRN
  Administered 2023-04-26: 2 mg via ORAL
  Filled 2023-04-26 (×2): qty 1

## 2023-04-26 MED ORDER — INSULIN GLARGINE-YFGN 100 UNIT/ML ~~LOC~~ SOLN
15.0000 [IU] | Freq: Every day | SUBCUTANEOUS | Status: DC
  Administered 2023-04-27 – 2023-04-28 (×2): 15 [IU] via SUBCUTANEOUS
  Filled 2023-04-26 (×3): qty 0.15

## 2023-04-26 MED ORDER — INSULIN ASPART 100 UNIT/ML IJ SOLN
0.0000 [IU] | Freq: Every day | INTRAMUSCULAR | Status: DC
  Administered 2023-04-26 – 2023-04-27 (×2): 2 [IU] via SUBCUTANEOUS

## 2023-04-26 MED ORDER — INSULIN ASPART 100 UNIT/ML IJ SOLN
0.0000 [IU] | Freq: Three times a day (TID) | INTRAMUSCULAR | Status: DC
  Administered 2023-04-27: 5 [IU] via SUBCUTANEOUS
  Administered 2023-04-27: 3 [IU] via SUBCUTANEOUS
  Administered 2023-04-28: 5 [IU] via SUBCUTANEOUS
  Administered 2023-04-28: 3 [IU] via SUBCUTANEOUS

## 2023-04-26 MED ORDER — NICOTINE 14 MG/24HR TD PT24
14.0000 mg | MEDICATED_PATCH | Freq: Every day | TRANSDERMAL | Status: DC
  Administered 2023-04-26 – 2023-04-28 (×3): 14 mg via TRANSDERMAL
  Filled 2023-04-26 (×3): qty 1

## 2023-04-26 NOTE — Plan of Care (Signed)
  Problem: Education: Goal: Knowledge of disease or condition will improve Outcome: Progressing Goal: Knowledge of secondary prevention will improve (MUST DOCUMENT ALL) Outcome: Progressing Goal: Knowledge of patient specific risk factors will improve Justin Snow N/A or DELETE if not current risk factor) Outcome: Progressing   Problem: Intracerebral Hemorrhage Tissue Perfusion: Goal: Complications of Intracerebral Hemorrhage will be minimized Outcome: Progressing   Problem: Coping: Goal: Will verbalize positive feelings about self Outcome: Progressing Goal: Will identify appropriate support needs Outcome: Progressing   Problem: Health Behavior/Discharge Planning: Goal: Ability to manage health-related needs will improve Outcome: Progressing Goal: Goals will be collaboratively established with patient/family Outcome: Progressing   Problem: Self-Care: Goal: Ability to participate in self-care as condition permits will improve Outcome: Progressing Goal: Verbalization of feelings and concerns over difficulty with self-care will improve Outcome: Progressing Goal: Ability to communicate needs accurately will improve Outcome: Progressing   Problem: Nutrition: Goal: Risk of aspiration will decrease Outcome: Progressing Goal: Dietary intake will improve Outcome: Progressing   Problem: Education: Goal: Knowledge of General Education information will improve Description: Including pain rating scale, medication(s)/side effects and non-pharmacologic comfort measures Outcome: Progressing   Problem: Health Behavior/Discharge Planning: Goal: Ability to manage health-related needs will improve Outcome: Progressing   Problem: Clinical Measurements: Goal: Ability to maintain clinical measurements within normal limits will improve Outcome: Progressing Goal: Will remain free from infection Outcome: Progressing Goal: Diagnostic test results will improve Outcome: Progressing Goal:  Respiratory complications will improve Outcome: Progressing Goal: Cardiovascular complication will be avoided Outcome: Progressing   Problem: Activity: Goal: Risk for activity intolerance will decrease Outcome: Progressing   Problem: Nutrition: Goal: Adequate nutrition will be maintained Outcome: Progressing   Problem: Coping: Goal: Level of anxiety will decrease Outcome: Progressing   Problem: Elimination: Goal: Will not experience complications related to bowel motility Outcome: Progressing Goal: Will not experience complications related to urinary retention Outcome: Progressing   Problem: Pain Managment: Goal: General experience of comfort will improve Outcome: Progressing   Problem: Safety: Goal: Ability to remain free from injury will improve Outcome: Progressing   Problem: Skin Integrity: Goal: Risk for impaired skin integrity will decrease Outcome: Progressing   Problem: Education: Goal: Ability to describe self-care measures that may prevent or decrease complications (Diabetes Survival Skills Education) will improve Outcome: Progressing Goal: Individualized Educational Video(s) Outcome: Progressing   Problem: Coping: Goal: Ability to adjust to condition or change in health will improve Outcome: Progressing   Problem: Fluid Volume: Goal: Ability to maintain a balanced intake and output will improve Outcome: Progressing   Problem: Health Behavior/Discharge Planning: Goal: Ability to identify and utilize available resources and services will improve Outcome: Progressing Goal: Ability to manage health-related needs will improve Outcome: Progressing   Problem: Metabolic: Goal: Ability to maintain appropriate glucose levels will improve Outcome: Progressing   Problem: Nutritional: Goal: Maintenance of adequate nutrition will improve Outcome: Progressing Goal: Progress toward achieving an optimal weight will improve Outcome: Progressing   Problem:  Skin Integrity: Goal: Risk for impaired skin integrity will decrease Outcome: Progressing   Problem: Tissue Perfusion: Goal: Adequacy of tissue perfusion will improve Outcome: Progressing

## 2023-04-26 NOTE — Inpatient Diabetes Management (Addendum)
Inpatient Diabetes Program Recommendations  AACE/ADA: New Consensus Statement on Inpatient Glycemic Control (2015)  Target Ranges:  Prepandial:   less than 140 mg/dL      Peak postprandial:   less than 180 mg/dL (1-2 hours)      Critically ill patients:  140 - 180 mg/dL   Lab Results  Component Value Date   GLUCAP 233 (H) 04/26/2023   HGBA1C 7.8 (H) 04/24/2023    Review of Glycemic Control  Latest Reference Range & Units 04/25/23 19:28 04/26/23 00:11 04/26/23 04:02 04/26/23 07:28 04/26/23 12:54  Glucose-Capillary 70 - 99 mg/dL 301 (H) 601 (H) 093 (H) 108 (H) 233 (H)   Diabetes history: DM 2 Outpatient Diabetes medications:  Lantus- no fill record?  Current orders for Inpatient glycemic control:  Novolog 2-6 units q 4 hours  Inpatient Diabetes Program Recommendations:    Please consider d/c of Novolog 2-6 units q 4 hours and add Novolog moderate (0-15 units) tid with meals and HS.    Addendum 1500- Spoke to patient at bedside.  He states that he does take insulin at home- Lantus 40 units bid.  [place] bus home?  Looks like he has medicaid.  He then said that he takes 70/30 40 units bid (instead of Lantus) and that he gets it at the [facility] in [place]"?  Looks like patient does have insurance now.  May consider adding low dose basal insulin such as Semglee 15 units daily while in the hospital. Will need close f/u with PCP.   Thanks,  Beryl Meager, RN, BC-ADM Inpatient Diabetes Coordinator Pager [phone] (8a-5p)

## 2023-04-26 NOTE — Consult Note (Addendum)
WOC Nurse Consult Note: Reason for Consult: WOC consult requested for scrotum.  Generalized edema and erythremia; painful to touch.  Appearance is consistent with moisture associated skin damage.  Topical treatment orders provided for bedside nurses to perform as follows: Apply Xeroform gauze to scrotum Q day Please re-consult if further assistance is needed.  Thank-you,  Cammie Mcgee MSN, RN, CWOCN, [place], CNS [phone]

## 2023-04-26 NOTE — Plan of Care (Signed)
  Problem: Education: Goal: Knowledge of disease or condition will improve 04/26/2023 1424 by Juluis Mire, RN Outcome: Progressing 04/26/2023 1417 by Juluis Mire, RN Outcome: Progressing Goal: Knowledge of secondary prevention will improve (MUST DOCUMENT ALL) 04/26/2023 1424 by Juluis Mire, RN Outcome: Progressing 04/26/2023 1417 by Juluis Mire, RN Outcome: Progressing Goal: Knowledge of patient specific risk factors will improve Loraine Leriche N/A or DELETE if not current risk factor) 04/26/2023 1424 by Juluis Mire, RN Outcome: Progressing 04/26/2023 1417 by Juluis Mire, RN Outcome: Progressing   Problem: Intracerebral Hemorrhage Tissue Perfusion: Goal: Complications of Intracerebral Hemorrhage will be minimized 04/26/2023 1424 by Juluis Mire, RN Outcome: Progressing 04/26/2023 1417 by Juluis Mire, RN Outcome: Progressing

## 2023-04-26 NOTE — Progress Notes (Addendum)
STROKE TEAM PROGRESS NOTE   BRIEF HPI Justin Snow is a 67 y.o. male with history significant for essential hypertension, hyperlipidemia, type 2 diabetes mellitus, smoking, alcohol use history presenting with altered mental status after a gas station attendant witnessed the patient walking around prior to being found lying under his car confused with left facial droop and slurred speech.  While in the ED, patient had significant hypertension with SBP in the 220s.  Last known well time was reported as midnight but unable to be confirmed.  SIGNIFICANT HOSPITAL EVENTS 7/28: Admitted for AMS, unclear LKW, hypertensive requiring cleviprex, small acute left thalamic hemorrhage on imaging, small acute white matter lacunar infarct  INTERIM HISTORY/SUBJECTIVE Patient is seen in his room with no family at the bedside.  He has transferred out of the ICU and remains hemodynamically stable with a stable neuroexam.  PT/OT are recommending discharge to skilled nursing facility for short-term rehabilitation.  OBJECTIVE  CBC    Component Value Date/Time   WBC 9.0 04/24/2023 0310   RBC 4.18 (L) 04/24/2023 0310   HGB 11.9 (L) 04/24/2023 0313   HCT 35.0 (L) 04/24/2023 0313   PLT 161 04/24/2023 0310   MCV 90.9 04/24/2023 0310   MCH 29.4 04/24/2023 0310   MCHC 32.4 04/24/2023 0310   RDW 14.0 04/24/2023 0310   LYMPHSABS 1.8 04/24/2023 0310   MONOABS 0.8 04/24/2023 0310   EOSABS 0.3 04/24/2023 0310   BASOSABS 0.1 04/24/2023 0310   BMET    Component Value Date/Time   NA 139 04/25/2023 0451   K 3.4 (L) 04/25/2023 0451   CL 108 04/25/2023 0451   CO2 20 (L) 04/25/2023 0451   GLUCOSE 118 (H) 04/25/2023 0451   BUN 22 04/25/2023 0451   CREATININE 1.36 (H) 04/25/2023 0451   CALCIUM 8.6 (L) 04/25/2023 0451   GFRNONAA 57 (L) 04/25/2023 0451   Lab Results  Component Value Date   HGBA1C 7.8 (H) 04/24/2023   Lab Results  Component Value Date   CHOL 138 04/24/2023   HDL 35 (L) 04/24/2023    LDLCALC 77 04/24/2023   TRIG 132 04/24/2023   CHOLHDL 3.9 04/24/2023    Drugs of Abuse     Component Value Date/Time   LABOPIA NONE DETECTED 04/24/2023 0639   COCAINSCRNUR NONE DETECTED 04/24/2023 0639   LABBENZ NONE DETECTED 04/24/2023 0639   AMPHETMU NONE DETECTED 04/24/2023 0639   THCU NONE DETECTED 04/24/2023 0639   LABBARB NONE DETECTED 04/24/2023 0639    Alcohol Level    Component Value Date/Time   ETH <10 04/24/2023 0310   IMAGING past 24 hours No results found.  Vitals:   04/25/23 2354 04/26/23 0359 04/26/23 0730 04/26/23 1100  BP: (!) 142/75 129/61 (!) 153/82 (!) 156/76  Pulse: 64 71 66 71  Resp: 18 18 18 18   Temp: 98.1 F (36.7 C) (!) 97.4 F (36.3 C) 98.1 F (36.7 C) 98.6 F (37 C)  TempSrc: Oral Oral Oral Oral  SpO2: 96% 95% 98%   Weight:      Height:       PHYSICAL EXAM General:  Alert, well-nourished, well-developed Caucasian male patient in no acute distress Psych: Affect is normal and appropriate to situation CV: Regular rate and rhythm on monitor Respiratory:  Regular, unlabored respirations on room air GI: Abdomen soft and nontender   NEURO:  Mental Status: Patient is alert and oriented to person place time and situation, but still not able to give much history of the events that led  up to his hospitalization  Cranial Nerves:  II: PERRL. Visual fields full.  III, IV, VI: EOMI.  V: Sensation is intact to light touch and symmetrical to face.  VII: Left facial weakness is noted VIII: Hearing intact to voice. IX, X:  Phonation is normal.  [id number] shrug 5/5. XII: Tongue protrudes midline Motor: Limited cooperation with confrontational strength testing. Patient does have some left grip weakness, left arm weakness compared to the right.  Diminished fine finger movements on the left with right hand orbiting left Sensation: Patient reports equal sensation to light touch bilaterally Coordination: FTN intact bilaterally Gait: Deferred for  patient safety  ASSESSMENT/PLAN  Intracerebral Hemorrhage: Small acute left thalamic hemorrhage, likely hypertensive in etiology versus less likely hemorrhagic transformation of ischemic infarct. Small acute lacunar white matter infarct in the right centrum semiovale due to small vessel disease CT head 7/28: 5 mm hyperdense focus in the left thalamus without surrounding edema. Aspects is 10.  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 CTA head & neck 7/28: No emergent LVO.  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  Mild bilateral carotid bifurcation atherosclerosis without hemodynamically significant stenosis. MRI 7/28: Truncated and mildly motion degraded noncontrast MRI.  Small acute left thalamic hemorrhage superimposed on evidence of extensive chronic microhemorrhages in the deep gray nuclei.  Small acute white matter lacunar infarct in the right centrum semiovale.  No associated hemorrhage or mass effect.  Underlying advanced chronic small vessel disease. EEG mild to moderate generalized slowing. 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 identified on CT head imaging 04/24/2023 MRI brain 04/24/2023 with evidence of numerous lacunar infarcts in addition to the evidence of blood products with occasional brainstem chronic lacunar infarcts Patient with no reported or documented history of strokes or previous stroke symptoms  Hypertension Home meds:  amlodipine, carvedilol  Cleviprex drip now off PRN labetalol  Blood Pressure Goal: SBP between 130-150 for 24 hours and then less than 160    Hyperlipidemia Home meds:  atorvastatin 10 mg LDL 77., goal < 70 Begin high intensity statin at discharge  Diabetes type II Uncontrolled Home meds:  Metformin, lantus  HgbA1c 7.8., goal < 7.0 CBGs SSI Recommend close follow-up with PCP for better DM control  Tobacco Abuse Patient smokes 1 packs per day for many years      Ready to quit? 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 Yes   Advance diet as tolerated  Other Stroke Risk Factors Obesity, Body mass index is 32.35 kg/m., BMI >/= 30 associated with increased stroke risk, recommend weight loss, diet and exercise as appropriate   Other Active Problems History of alcohol use Serum EtOH without evidence of intoxication on arrival Patient denies recent alcohol use  Hospital day # 2  Cortney E Ernestina Columbia , MSN, AGACNP-BC [facility] See Amion for schedule  and pager information 04/26/2023 1:59 PM I have personally obtained history,examined this patient, reviewed notes, independently viewed imaging studies, participated in medical decision making and plan of care.ROS completed by me personally and pertinent positives fully documented  I have made any additions or clarifications directly to the above note. Agree with note above.  Patient lives alone and therapist recommend short-term rehab likely in a skilled nursing setting.  Patient seems unhappy about this.  He also wants to leave AGAINST MEDICAL ADVICE and smoke.  He is on a nicotine patch.  Patient counseled to stay.  Greater than 50% time during this 35-minute visit was spent on counseling and coordination of care and discussion with patient and care team and answering  questions  Delia Heady, MD Medical Director [facility] Pager: [phone] 04/26/2023 5:02 PM   To contact Stroke Continuity provider, please refer to [email or web address]. After hours, contact General Neurology

## 2023-04-26 NOTE — Progress Notes (Signed)
Patient left unit after being educated not to. Patient left without notifying anyone and off unit. Security called and pt being located. Patient seen downstairs near EVS.

## 2023-04-26 NOTE — Progress Notes (Signed)
Physical Therapy Treatment Patient Details Name: Justin Snow MRN: [id number] DOB: 1956/01/22 Today's Date: 04/26/2023   History of Present Illness Pt is a 67 y.o. M who presents 04/24/2023 after being found under his car with altered mental status and slurred speech. Found to have severe HTN for which code stroke was activated. While in ED, pt found to have episodes of bradycardia with no other symptoms associated. 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 Significant PMH: HTN, HLD, diabetes, smoking, alcohol use history.    PT Comments  Pt seen for PT tx with pt agreeable. Pt presents with decreased overall awareness requiring cuing for mobility tasks. Pt requires CGA<>min assist for transfers & gait with HHA. Attempted to complete Berg Balance Test but during testing pt reports need to have BM. Pt with incontinent BM when walking to bathroom, reporting this is not his baseline. PT provides assistance for peri hygiene to ensure cleanliness. Pt notes he's "wore out" after events of session. Due to pt's decreased activity tolerance, impaired awareness, impaired balance & gait, have updated d/c recommendation as pt is currently unsafe to d/c home alone & would benefit from post acute therapies <3 hours/day.   If plan is discharge home, recommend the following: A little help with walking and/or transfers;A little help with bathing/dressing/bathroom;Assistance with cooking/housework;Assist for transportation;Help with stairs or ramp for entrance;Direct supervision/assist for medications management   Can travel by private vehicle     Yes  Equipment Recommendations  Rolling walker (2 wheels)    Recommendations for Other Services       Precautions / Restrictions Precautions Precautions: Fall Restrictions Weight Bearing Restrictions: No     Mobility  Bed Mobility Overal bed mobility: Needs Assistance Bed Mobility: Supine  to Sit     Supine to sit: Supervision, HOB elevated     General bed mobility comments: extra time, c/o pain with movement, pt requests PT assist him but PT provides cuing for use of bed rails & pt able to complete    Transfers Overall transfer level: Needs assistance Equipment used: None Transfers: Sit to/from Stand Sit to Stand: Min guard           General transfer comment: STS from EOB, low toilet with grab bar with CGA    Ambulation/Gait Ambulation/Gait assistance: Min guard, Min assist Gait Distance (Feet): 10 Feet (+ 15 ft) Assistive device: 1 person hand held assist, None Gait Pattern/deviations: Decreased step length - right, Decreased step length - left, Decreased stride length, Staggering left, Staggering right Gait velocity: decreased     General Gait Details: Pt frequently reaching for objects/furniture for support   Stairs             Wheelchair Mobility     Tilt Bed    Modified Rankin (Stroke Patients Only)       Balance Overall balance assessment: Needs assistance   Sitting balance-Leahy Scale: Good     Standing balance support: Single extremity supported, During functional activity Standing balance-Leahy Scale: Poor                   Standardized Balance Assessment Standardized Balance Assessment : Berg Balance Test Berg Balance Test Sit to Stand: Able to stand  independently using hands Standing Unsupported: Able to stand 2 minutes with supervision Sitting with Back Unsupported but Feet Supported on Floor or Stool: Able to sit safely and securely 2 minutes Stand to Sit: Uses backs of legs  against chair to control descent Standing Unsupported with Eyes Closed: Able to stand 10 seconds with supervision Standing Ubsupported with Feet Together: Able to place feet together independently and stand for 1 minute with supervision        Cognition Arousal/Alertness: Awake/alert Behavior During Therapy: Flat affect Overall  Cognitive Status: No family/caregiver present to determine baseline cognitive functioning Area of Impairment: Attention, Following commands, Awareness, Safety/judgement, Problem solving                     Memory: Decreased short-term memory Following Commands: Follows one step commands consistently, Follows one step commands with increased time Safety/Judgement: Decreased awareness of safety, Decreased awareness of deficits Awareness: Emergent Problem Solving: Slow processing, Decreased initiation, Requires verbal cues, Difficulty sequencing, Requires tactile cues          Exercises      General Comments General comments (skin integrity, edema, etc.): RN notified of burning pain in groin, RN to place wound consult      Pertinent Vitals/Pain Pain Assessment Pain Assessment: Faces Faces Pain Scale: Hurts even more Pain Location: scrotum, penis where catheter is located Pain Descriptors / Indicators: Discomfort, Grimacing, Guarding Pain Intervention(s): Monitored during session, Repositioned, Limited activity within patient's tolerance    Home Living                   Home Equipment: None      Prior Function            PT Goals (current goals can now be found in the care plan section) Acute Rehab PT Goals Patient Stated Goal: did not state PT Goal Formulation: With patient Time For Goal Achievement: 05/09/23 Potential to Achieve Goals: Good Progress towards PT goals: Progressing toward goals    Frequency    Min 1X/week      PT Plan Discharge plan needs to be updated;Equipment recommendations need to be updated    Co-evaluation              AM-PAC PT "6 Clicks" Mobility   Outcome Measure  Help needed turning from your back to your side while in a flat bed without using bedrails?: None Help needed moving from lying on your back to sitting on the side of a flat bed without using bedrails?: A Little Help needed moving to and from a bed to  a chair (including a wheelchair)?: A Little Help needed standing up from a chair using your arms (e.g., wheelchair or bedside chair)?: A Little Help needed to walk in hospital room?: A Little Help needed climbing 3-5 steps with a railing? : A Little 6 Click Score: 19    End of Session Equipment Utilized During Treatment: Gait belt Activity Tolerance: Patient tolerated treatment well;Patient limited by fatigue Patient left: in chair;with chair alarm set;with call bell/phone within reach Nurse Communication: Mobility status PT Visit Diagnosis: Unsteadiness on feet (R26.81);Difficulty in walking, not elsewhere classified (R26.2);Muscle weakness (generalized) (M62.81)     Time: 4540-9811 PT Time Calculation (min) (ACUTE ONLY): 28 min  Charges:    $Therapeutic Activity: 23-37 mins PT General Charges $$ ACUTE PT VISIT: 1 Visit                     Aleda Grana, PT, DPT 04/26/23, 12:40 PM   Sandi Mariscal 04/26/2023, 12:34 PM

## 2023-04-26 NOTE — Progress Notes (Addendum)
Patient continue to disregard safety precautions and getting OOB. MD aware. IVF fluid dc'ed per request. Nicotine gum administered. Continuous education provided in regards to fall but patient still declined bed and chair alarms.   POC: Continue hourly rounds.

## 2023-04-26 NOTE — Progress Notes (Signed)
Pt trying to smoke in room/walk outside to smoke. Pt said he wants to leave AMA. Talked pt into Nicotine patch; nicotine patch requested from MD.

## 2023-04-26 NOTE — TOC Initial Note (Signed)
Transition of Care [facility]) - Initial/Assessment Note    Patient Details  Name: Justin Snow MRN: [id number] Date of Birth: 1956-01-27  [facility] CM/SW Contact:    Justin Balo, RN Phone Number: 04/26/2023, 12:44 PM  Clinical Narrative:                 Pt is from a motel. He states he is supposed to move into a mobile home in [place].  Pt asked for ex spouse to be called. CM was able to reach his ex-spouse, Justin Snow. She says pt can not stay with her. She will come and visit. Current recommendations are for SNF rehab. LCSW to fax out and provide bed offers to the patient.  TOC following.   Expected Discharge Plan: Skilled Nursing Facility Barriers to Discharge: Continued Medical Work up   Patient Goals and CMS Choice   CMS Medicare.gov Compare Post Acute Care list provided to:: Patient Choice offered to / list presented to : Patient      Expected Discharge Plan and Services In-house Referral: Clinical Social Work   Post Acute Care Choice: Skilled Nursing Facility Living arrangements for the past 2 months: Hotel/Motel                                      Prior Living Arrangements/Services Living arrangements for the past 2 months: Hotel/Motel Lives with:: Self Patient language and need for interpreter reviewed:: Yes Do you feel safe going back to the place where you live?: Yes        Care giver support system in place?: No (comment)   Criminal Activity/Legal Involvement Pertinent to Current Situation/Hospitalization: No - Comment as needed  Activities of Daily Living      Permission Sought/Granted                  Emotional Assessment Appearance:: Appears stated age   Affect (typically observed): Accepting Orientation: : Oriented to Self, Oriented to Place, Oriented to Situation Alcohol / Substance Use: Illicit Drugs Psych Involvement: No (comment)  Admission diagnosis:  ICH (intracerebral hemorrhage) (HCC)  [I61.9] Acute encephalopathy [G93.40] Altered mental status, unspecified altered mental status type [R41.82] Patient Active Problem List   Diagnosis Date Noted   Acute encephalopathy 04/24/2023   ICH (intracerebral hemorrhage) (HCC) 04/24/2023   Cerebrovascular accident (CVA) (HCC) 04/24/2023   Mixed hyperlipidemia 07/31/2020   Essential hypertension 06/27/2020   Tobacco dependence 06/27/2020   Bruit of left carotid artery 06/27/2020   Bilateral leg edema 06/27/2020   PCP:  Elisabeth Most, FNP Pharmacy:   [id number] [address] [place], [place] - [address] [place] [address] Phone: [phone] Fax: [phone]     Social Determinants of Health (SDOH) Social History: SDOH Screenings   Tobacco Use: High Risk (04/24/2023)   SDOH Interventions:     Readmission Risk Interventions     No data to display

## 2023-04-26 NOTE — Progress Notes (Addendum)
Occupational Therapy Treatment Patient Details Name: Justin Snow MRN: [id number] DOB: Jan 22, 1956 Today's Date: 04/26/2023   History of present illness Pt is a 67 y.o. M who presents 04/24/2023 after being found under his car with altered mental status and slurred speech. Found to have severe HTN for which code stroke was activated. While in ED, pt found to have episodes of bradycardia with no other symptoms associated. 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 Significant PMH: HTN, HLD, diabetes, smoking, alcohol use history.   OT comments  Patient supine in bed and agreeable to OT.  Pt requires up to min guard for ADLs and supervision for transfers.  Limited by pain in groin area and highly distracted.  Attempted pill box test and unable to complete due to cognition.  He will need 24/7 assist at dc, anticipate SNF at this time. At DC pt will need assist for medications, cooking, fiances and driving.    Recommendations for follow up therapy are one component of a multi-disciplinary discharge planning process, led by the attending physician.  Recommendations may be updated based on patient status, additional functional criteria and insurance authorization.    Assistance Recommended at Discharge Frequent or constant Supervision/Assistance  Patient can return home with the following  A little help with walking and/or transfers;Assistance with cooking/housework;Direct supervision/assist for medications management;Direct supervision/assist for financial management;Assist for transportation;A little help with bathing/dressing/bathroom   Equipment Recommendations  BSC/3in1    Recommendations for Other Services      Precautions / Restrictions Precautions Precautions: Fall Restrictions Weight Bearing Restrictions: No       Mobility Bed Mobility Overal bed mobility: Modified Independent                   Transfers Overall transfer level: Needs assistance Equipment used: None Transfers: Sit to/from Stand Sit to Stand: Supervision                 Balance Overall balance assessment: Mild deficits observed, not formally tested                                         ADL either performed or assessed with clinical judgement   ADL Overall ADL's : Needs assistance/impaired     Grooming: Min guard;Standing               Lower Body Dressing: Min guard;Sit to/from Market researcher Details (indicate cue type and reason): deferred d/t pain         Functional mobility during ADLs: Min guard      Extremity/Trunk Assessment              Vision   Additional Comments: reports loosing his glasses in jail   Perception     Praxis      Cognition Arousal/Alertness: Awake/alert Behavior During Therapy: Flat affect Overall Cognitive Status: No family/caregiver present to determine baseline cognitive functioning Area of Impairment: Orientation, Memory, Awareness, Safety/judgement, Problem solving, Attention, Following commands                 Orientation Level: Disoriented to, Time Current Attention Level: Sustained Memory: Decreased short-term memory Following Commands: Follows one step commands consistently, Follows one step commands with increased time Safety/Judgement: Decreased awareness of safety Awareness: Emergent Problem Solving: Slow processing, Decreased initiation, Requires verbal cues, Difficulty  sequencing, Requires tactile cues General Comments: pt remains disoriented to exact date, follows simple commands but demonstrates poor awareness of deficits.attempted pill box test without success.  internally distracted by pain in groin        Exercises      Shoulder Instructions       General Comments RN notified of burning pain in groin, RN to place wound consult    Pertinent Vitals/ Pain       Pain  Assessment Pain Assessment: Faces Faces Pain Scale: Hurts whole lot Pain Location: groin Pain Descriptors / Indicators: Discomfort Pain Intervention(s): Limited activity within patient's tolerance, Monitored during session, Repositioned  Home Living                     Bathroom Shower/Tub: Chief Strategy Officer: Standard     Home Equipment: None          Prior Functioning/Environment              Frequency  Min 1X/week        Progress Toward Goals  OT Goals(current goals can now be found in the care plan section)  Progress towards OT goals: Progressing toward goals  Acute Rehab OT Goals Patient Stated Goal: home OT Goal Formulation: With patient Time For Goal Achievement: 05/09/23 Potential to Achieve Goals: Good  Plan Frequency remains appropriate;Discharge plan needs to be updated    Co-evaluation                 AM-PAC OT "6 Clicks" Daily Activity     Outcome Measure   Help from another person eating meals?: None Help from another person taking care of personal grooming?: A Little Help from another person toileting, which includes using toliet, bedpan, or urinal?: A Lot Help from another person bathing (including washing, rinsing, drying)?: A Little Help from another person to put on and taking off regular upper body clothing?: A Little Help from another person to put on and taking off regular lower body clothing?: A Little 6 Click Score: 18    End of Session    OT Visit Diagnosis: Other symptoms and signs involving cognitive function;Other abnormalities of gait and mobility (R26.89)   Activity Tolerance Patient tolerated treatment well   Patient Left in bed;with call bell/phone within reach;with bed alarm set   Nurse Communication Mobility status        Time: 1610-9604 OT Time Calculation (min): 34 min  Charges: OT General Charges $OT Visit: 1 Visit OT Treatments $Self Care/Home Management : 23-37  mins  Barry Brunner, OT Acute Rehabilitation Services Office [phone]   Chancy Milroy 04/26/2023, 12:25 PM

## 2023-04-26 NOTE — NC FL2 (Signed)
MEDICAID FL2 LEVEL OF CARE FORM     IDENTIFICATION  Patient Name: Justin Snow Birthdate: 12/13/1955 Sex: male Admission Date (Current Location): 04/24/2023  [facility] and [place] Number:  [employer] and Address:  [facility], [address], [place], [place] [address]      Provider Number: [id number]  Attending Physician Name and Address:  Stroke, Md, MD  Relative Name and Phone Number:       Current Level of Care: Hospital Recommended Level of Care: Skilled Nursing Facility Prior Approval Number:    Date Approved/Denied:   PASRR Number: [id number]  Discharge Plan: SNF    Current Diagnoses: Patient Active Problem List   Diagnosis Date Noted   Acute encephalopathy 04/24/2023   ICH (intracerebral hemorrhage) (HCC) 04/24/2023   Cerebrovascular accident (CVA) (HCC) 04/24/2023   Mixed hyperlipidemia 07/31/2020   Essential hypertension 06/27/2020   Tobacco dependence 06/27/2020   Bruit of left carotid artery 06/27/2020   Bilateral leg edema 06/27/2020    Orientation RESPIRATION BLADDER Height & Weight     Self, Time, Situation, Place  Normal Continent Weight: 200 lb 6.4 oz (90.9 kg) Height:  5\' 6"  (167.6 cm)  BEHAVIORAL SYMPTOMS/MOOD NEUROLOGICAL BOWEL NUTRITION STATUS      Continent Diet (carb modified)  AMBULATORY STATUS COMMUNICATION OF NEEDS Skin   Limited Assist Verbally Normal                       Personal Care Assistance Level of Assistance  Bathing, Feeding, Dressing Bathing Assistance: Limited assistance Feeding assistance: Limited assistance Dressing Assistance: Limited assistance     Functional Limitations Info  Hearing   Hearing Info: Impaired      SPECIAL CARE FACTORS FREQUENCY  PT (By licensed PT), OT (By licensed OT)     PT Frequency: 5x/wk OT Frequency: 5x/wk            Contractures Contractures Info: Not present    Additional Factors Info  Code Status, Allergies,  Psychotropic, Insulin Sliding Scale Code Status Info: Full Allergies Info: NKA Psychotropic Info: Cymbalta 30mg  2x/day; Insulin Sliding Scale Info: see DC summary       Current Medications (04/26/2023):  This is the current hospital active medication list Current Facility-Administered Medications  Medication Dose Route Frequency Provider Last Rate Last Admin   acetaminophen (TYLENOL) tablet 650 mg  650 mg Oral Q4H PRN Bhagat, Srishti L, MD   650 mg at 04/24/23 1615   Or   acetaminophen (TYLENOL) 160 MG/5ML solution 650 mg  650 mg Per Tube Q4H PRN Bhagat, Srishti L, MD       Or   acetaminophen (TYLENOL) suppository 650 mg  650 mg Rectal Q4H PRN Bhagat, Srishti L, MD       albuterol (PROVENTIL) (2.5 MG/3ML) 0.083% nebulizer solution 2.5 mg  2.5 mg Inhalation Q6H PRN Agarwala, Daleen Bo, MD       amLODipine (NORVASC) tablet 10 mg  10 mg Oral Daily Agarwala, Daleen Bo, MD   10 mg at 04/26/23 0857   atorvastatin (LIPITOR) tablet 10 mg  10 mg Oral QHS Lynnell Catalan, MD   10 mg at 04/25/23 2105   Chlorhexidine Gluconate Cloth 2 % PADS 6 each  6 each Topical Daily Micki Riley, MD   6 each at 04/26/23 0857   clevidipine (CLEVIPREX) infusion 0.5 mg/mL  0-21 mg/hr Intravenous Continuous Micki Riley, MD   Stopped at 04/25/23 0845   DULoxetine (CYMBALTA) DR  capsule 30 mg  30 mg Oral BID Lynnell Catalan, MD   30 mg at 04/26/23 0857   gabapentin (NEURONTIN) capsule 400 mg  400 mg Oral TID Lynnell Catalan, MD   400 mg at 04/26/23 1605   hydrALAZINE (APRESOLINE) injection 10 mg  10 mg Intravenous Q4H PRN Micki Riley, MD   10 mg at 04/25/23 1537   hydrochlorothiazide (HYDRODIURIL) tablet 12.5 mg  12.5 mg Oral Daily Cheri Fowler, MD   12.5 mg at 04/26/23 0857   insulin aspart (novoLOG) injection 0-15 Units  0-15 Units Subcutaneous TID WC de Saintclair Halsted, Cortney E, NP       insulin aspart (novoLOG) injection 0-5 Units  0-5 Units Subcutaneous QHS de Saintclair Halsted, Cortney E, NP       insulin glargine-yfgn (SEMGLEE)  injection 15 Units  15 Units Subcutaneous Daily de Saintclair Halsted, Cortney E, NP       labetalol (NORMODYNE) injection 10 mg  10 mg Intravenous Q2H PRN Francena Hanly, RPH       lactated ringers infusion   Intravenous Continuous Lynnell Catalan, MD 75 mL/hr at 04/26/23 0856 New Bag at 04/26/23 0856   lisinopril (ZESTRIL) tablet 40 mg  40 mg Oral Daily Agarwala, Daleen Bo, MD   40 mg at 04/26/23 0857   mometasone-formoterol (DULERA) 200-5 MCG/ACT inhaler 2 puff  2 puff Inhalation BID Lynnell Catalan, MD   2 puff at 04/25/23 1938   nicotine (NICODERM CQ - dosed in mg/24 hours) patch 14 mg  14 mg Transdermal Daily de Saintclair Halsted, Cortney E, NP   14 mg at 04/26/23 1605   nicotine polacrilex (NICORETTE) gum 2 mg  2 mg Oral PRN de Saintclair Halsted, Cortney E, NP   2 mg at 04/26/23 1649   Oral care mouth rinse  15 mL Mouth Rinse PRN Micki Riley, MD       pantoprazole (PROTONIX) EC tablet 40 mg  40 mg Oral Daily Agarwala, Daleen Bo, MD   40 mg at 04/26/23 0857   potassium chloride SA (KLOR-CON M) CR tablet 20 mEq  20 mEq Oral Daily Agarwala, Daleen Bo, MD   20 mEq at 04/26/23 0857   senna-docusate (Senokot-S) tablet 1 tablet  1 tablet Oral BID Bhagat, Srishti L, MD   1 tablet at 04/26/23 0857   sodium chloride flush (NS) 0.9 % injection 3 mL  3 mL Intravenous Once Dione Booze, MD         Discharge Medications: Please see discharge summary for a list of discharge medications.  Relevant Imaging Results:  Relevant Lab Results:   Additional Information SS#: [id number]  Baldemar Lenis, LCSW

## 2023-04-27 ENCOUNTER — Other Ambulatory Visit (HOSPITAL_COMMUNITY): Payer: Self-pay

## 2023-04-27 DIAGNOSIS — G9340 Encephalopathy, unspecified: Secondary | ICD-10-CM | POA: Diagnosis not present

## 2023-04-27 LAB — BASIC METABOLIC PANEL
Anion gap: 8 (ref 5–15)
BUN: 25 mg/dL — ABNORMAL HIGH (ref 8–23)
CO2: 23 mmol/L (ref 22–32)
Calcium: 8.5 mg/dL — ABNORMAL LOW (ref 8.9–10.3)
Chloride: 103 mmol/L (ref 98–111)
Creatinine, Ser: 1.49 mg/dL — ABNORMAL HIGH (ref 0.61–1.24)
GFR, Estimated: 51 mL/min — ABNORMAL LOW (ref >60)
Glucose, Bld: 129 mg/dL — ABNORMAL HIGH (ref 70–99)
Potassium: 4.2 mmol/L (ref 3.5–5.1)
Sodium: 134 mmol/L — ABNORMAL LOW (ref 135–145)

## 2023-04-27 LAB — CBC
HCT: 35.9 % — ABNORMAL LOW (ref 39.0–52.0)
Hemoglobin: 11.8 g/dL — ABNORMAL LOW (ref 13.0–17.0)
MCH: 28.4 pg (ref 26.0–34.0)
MCHC: 32.9 g/dL (ref 30.0–36.0)
MCV: 86.3 fL (ref 80.0–100.0)
Platelets: 175 10*3/uL (ref 150–400)
RBC: 4.16 MIL/uL — ABNORMAL LOW (ref 4.22–5.81)
RDW: 14.2 % (ref 11.5–15.5)
WBC: 10 10*3/uL (ref 4.0–10.5)
nRBC: 0 % (ref 0.0–0.2)

## 2023-04-27 LAB — MAGNESIUM: Magnesium: 1.8 mg/dL (ref 1.7–2.4)

## 2023-04-27 LAB — GLUCOSE, CAPILLARY
Glucose-Capillary: 231 mg/dL — ABNORMAL HIGH (ref 70–99)
Glucose-Capillary: 157 mg/dL — ABNORMAL HIGH (ref 70–99)
Glucose-Capillary: 228 mg/dL — ABNORMAL HIGH (ref 70–99)

## 2023-04-27 MED ORDER — HYDROCHLOROTHIAZIDE 12.5 MG PO TABS
12.5000 mg | ORAL_TABLET | Freq: Every day | ORAL | 0 refills | Status: DC
  Filled 2023-04-27: qty 30, 30d supply, fill #0

## 2023-04-27 MED ORDER — INSULIN GLARGINE-YFGN 100 UNIT/ML ~~LOC~~ SOPN
15.0000 [IU] | PEN_INJECTOR | Freq: Every day | SUBCUTANEOUS | 0 refills | Status: AC
  Filled 2023-04-27: qty 3, 20d supply, fill #0

## 2023-04-27 MED ORDER — OMEPRAZOLE 20 MG PO CPDR
20.0000 mg | DELAYED_RELEASE_CAPSULE | Freq: Every morning | ORAL | 0 refills | Status: AC
  Filled 2023-04-27: qty 30, 30d supply, fill #0

## 2023-04-27 MED ORDER — LANCET DEVICE MISC
1.0000 | Freq: Three times a day (TID) | 0 refills | Status: AC
  Filled 2023-04-27: qty 1, 30d supply, fill #0
  Filled 2023-04-27: qty 1, fill #0

## 2023-04-27 MED ORDER — AMLODIPINE BESYLATE 10 MG PO TABS
10.0000 mg | ORAL_TABLET | Freq: Every day | ORAL | 0 refills | Status: AC
  Filled 2023-04-27: qty 30, 30d supply, fill #0

## 2023-04-27 MED ORDER — ATORVASTATIN CALCIUM 10 MG PO TABS
10.0000 mg | ORAL_TABLET | Freq: Every day | ORAL | 0 refills | Status: DC
Start: 2023-04-27 — End: 2023-06-02
  Filled 2023-04-27: qty 30, 30d supply, fill #0

## 2023-04-27 MED ORDER — BLOOD GLUCOSE TEST VI STRP
1.0000 | ORAL_STRIP | Freq: Three times a day (TID) | 0 refills | Status: AC
  Filled 2023-04-27: qty 100, 34d supply, fill #0
  Filled 2023-04-27: qty 50, 17d supply, fill #0

## 2023-04-27 MED ORDER — INSULIN PEN NEEDLE 31G X 8 MM MISC
1.0000 | Freq: Three times a day (TID) | 0 refills | Status: AC
  Filled 2023-04-27: qty 100, 34d supply, fill #0

## 2023-04-27 MED ORDER — TRUEPLUS LANCETS 28G MISC
1.0000 | Freq: Three times a day (TID) | 0 refills | Status: AC
  Filled 2023-04-27 (×2): qty 100, 34d supply, fill #0

## 2023-04-27 MED ORDER — GUAIFENESIN 100 MG/5ML PO LIQD: 5.0000 mL | ORAL | Status: DC | PRN

## 2023-04-27 MED ORDER — METOPROLOL TARTRATE 5 MG/5ML IV SOLN: 5.0000 mg | INTRAVENOUS | Status: DC | PRN

## 2023-04-27 MED ORDER — GVOKE HYPOPEN 2-PACK 1 MG/0.2ML ~~LOC~~ SOAJ
1.0000 mg | SUBCUTANEOUS | 0 refills | Status: AC | PRN
  Filled 2023-04-27: qty 0.4, 30d supply, fill #0

## 2023-04-27 MED ORDER — SENNOSIDES-DOCUSATE SODIUM 8.6-50 MG PO TABS: 1.0000 | ORAL_TABLET | Freq: Every evening | ORAL | Status: DC | PRN

## 2023-04-27 MED ORDER — ONDANSETRON HCL 4 MG/2ML IJ SOLN: 4.0000 mg | Freq: Four times a day (QID) | INTRAMUSCULAR | Status: DC | PRN

## 2023-04-27 MED ORDER — BLOOD GLUCOSE MONITOR SYSTEM W/DEVICE KIT
1.0000 | PACK | Freq: Three times a day (TID) | 0 refills | Status: AC
  Filled 2023-04-27 (×2): qty 1, 30d supply, fill #0

## 2023-04-27 MED ORDER — DULOXETINE HCL 30 MG PO CPEP
30.0000 mg | ORAL_CAPSULE | Freq: Two times a day (BID) | ORAL | 0 refills | Status: AC
  Filled 2023-04-27: qty 60, 30d supply, fill #0

## 2023-04-27 MED ORDER — LISINOPRIL 40 MG PO TABS
40.0000 mg | ORAL_TABLET | Freq: Every day | ORAL | 0 refills | Status: AC
Start: 2023-04-27 — End: ?
  Filled 2023-04-27: qty 30, 30d supply, fill #0

## 2023-04-27 MED ORDER — INSULIN ASPART 100 UNIT/ML FLEXPEN
0.0000 [IU] | PEN_INJECTOR | Freq: Three times a day (TID) | SUBCUTANEOUS | 0 refills | Status: AC
  Filled 2023-04-27: qty 6, 30d supply, fill #0

## 2023-04-27 MED ORDER — METFORMIN HCL 1000 MG PO TABS
1000.0000 mg | ORAL_TABLET | Freq: Two times a day (BID) | ORAL | 0 refills | Status: DC
  Filled 2023-04-27: qty 60, 30d supply, fill #0

## 2023-04-27 MED ORDER — TRAZODONE HCL 50 MG PO TABS
50.0000 mg | ORAL_TABLET | Freq: Every evening | ORAL | Status: DC | PRN
  Administered 2023-04-27: 50 mg via ORAL
  Filled 2023-04-27: qty 1

## 2023-04-27 MED ORDER — CARVEDILOL 6.25 MG PO TABS
6.2500 mg | ORAL_TABLET | Freq: Two times a day (BID) | ORAL | 0 refills | Status: AC
  Filled 2023-04-27: qty 60, 30d supply, fill #0

## 2023-04-27 NOTE — Plan of Care (Signed)
Problem: Education: Goal: Knowledge of disease or condition will improve Outcome: Adequate for Discharge Goal: Knowledge of secondary prevention will improve (MUST DOCUMENT ALL) Outcome: Adequate for Discharge Goal: Knowledge of patient specific risk factors will improve Loraine Leriche N/A or DELETE if not current risk factor) Outcome: Adequate for Discharge   Problem: Intracerebral Hemorrhage Tissue Perfusion: Goal: Complications of Intracerebral Hemorrhage will be minimized Outcome: Adequate for Discharge   Problem: Coping: Goal: Will verbalize positive feelings about self Outcome: Adequate for Discharge Goal: Will identify appropriate support needs Outcome: Adequate for Discharge   Problem: Health Behavior/Discharge Planning: Goal: Ability to manage health-related needs will improve Outcome: Adequate for Discharge Goal: Goals will be collaboratively established with patient/family Outcome: Adequate for Discharge   Problem: Self-Care: Goal: Ability to participate in self-care as condition permits will improve Outcome: Adequate for Discharge Goal: Verbalization of feelings and concerns over difficulty with self-care will improve Outcome: Adequate for Discharge Goal: Ability to communicate needs accurately will improve Outcome: Adequate for Discharge   Problem: Nutrition: Goal: Risk of aspiration will decrease Outcome: Adequate for Discharge Goal: Dietary intake will improve Outcome: Adequate for Discharge   Problem: Education: Goal: Knowledge of General Education information will improve Description: Including pain rating scale, medication(s)/side effects and non-pharmacologic comfort measures Outcome: Adequate for Discharge   Problem: Health Behavior/Discharge Planning: Goal: Ability to manage health-related needs will improve Outcome: Adequate for Discharge   Problem: Clinical Measurements: Goal: Ability to maintain clinical measurements within normal limits will  improve Outcome: Adequate for Discharge Goal: Will remain free from infection Outcome: Adequate for Discharge Goal: Diagnostic test results will improve Outcome: Adequate for Discharge Goal: Respiratory complications will improve Outcome: Adequate for Discharge Goal: Cardiovascular complication will be avoided Outcome: Adequate for Discharge   Problem: Activity: Goal: Risk for activity intolerance will decrease Outcome: Adequate for Discharge   Problem: Nutrition: Goal: Adequate nutrition will be maintained Outcome: Adequate for Discharge   Problem: Coping: Goal: Level of anxiety will decrease Outcome: Adequate for Discharge   Problem: Elimination: Goal: Will not experience complications related to bowel motility Outcome: Adequate for Discharge Goal: Will not experience complications related to urinary retention Outcome: Adequate for Discharge   Problem: Pain Managment: Goal: General experience of comfort will improve Outcome: Adequate for Discharge   Problem: Safety: Goal: Ability to remain free from injury will improve Outcome: Adequate for Discharge   Problem: Skin Integrity: Goal: Risk for impaired skin integrity will decrease Outcome: Adequate for Discharge   Problem: Education: Goal: Ability to describe self-care measures that may prevent or decrease complications (Diabetes Survival Skills Education) will improve Outcome: Adequate for Discharge Goal: Individualized Educational Video(s) Outcome: Adequate for Discharge   Problem: Coping: Goal: Ability to adjust to condition or change in health will improve Outcome: Adequate for Discharge   Problem: Fluid Volume: Goal: Ability to maintain a balanced intake and output will improve Outcome: Adequate for Discharge   Problem: Health Behavior/Discharge Planning: Goal: Ability to identify and utilize available resources and services will improve Outcome: Adequate for Discharge Goal: Ability to manage  health-related needs will improve Outcome: Adequate for Discharge   Problem: Metabolic: Goal: Ability to maintain appropriate glucose levels will improve Outcome: Adequate for Discharge   Problem: Nutritional: Goal: Maintenance of adequate nutrition will improve Outcome: Adequate for Discharge Goal: Progress toward achieving an optimal weight will improve Outcome: Adequate for Discharge   Problem: Skin Integrity: Goal: Risk for impaired skin integrity will decrease Outcome: Adequate for Discharge   Problem: Tissue Perfusion: Goal: Adequacy  of tissue perfusion will improve Outcome: Adequate for Discharge

## 2023-04-27 NOTE — TOC CAGE-AID Note (Signed)
Transition of Care [facility]) - CAGE-AID Screening   Patient Details  Name: Justin Snow MRN: [id number] Date of Birth: [birth year]  [facility]) CM/SW Contact:    Kermit Balo, RN Phone Number: 04/27/2023, 4:01 PM   Clinical Narrative: Pt denies substance use.refused resources.   CAGE-AID Screening:

## 2023-04-27 NOTE — Plan of Care (Signed)
  Problem: Coping: Goal: Will verbalize positive feelings about self Outcome: Progressing   Problem: Nutrition: Goal: Risk of aspiration will decrease Outcome: Progressing Goal: Dietary intake will improve Outcome: Progressing   Problem: Self-Care: Goal: Ability to communicate needs accurately will improve Outcome: Progressing   Problem: Clinical Measurements: Goal: Ability to maintain clinical measurements within normal limits will improve Outcome: Progressing   Problem: Clinical Measurements: Goal: Will remain free from infection Outcome: Progressing

## 2023-04-27 NOTE — Discharge Summary (Signed)
Physician Discharge Summary  OMRI CAPONI NWG:[id number] DOB: 27-Jan-1956 DOA: 04/24/2023  PCP: Elisabeth Most, FNP  Admit date: 04/24/2023 Discharge date: 04/27/2023  Admitted From: Home Disposition:  Home  Recommendations for Outpatient Follow-up:  Follow up with PCP in 1-2 weeks Please obtain BMP/CBC in one week your next doctors visit.  Medication refills has been prescribed as below   Discharge Condition: Stable CODE STATUS: Full code Diet recommendation: Heart health the  Brief/Interim Summary:  67 year old with history of HTN, HLD, DM2, tobacco and alcohol use presented with altered mental status after a gas station attendant witnessed patient was walking around prior to being found laying under his car confused with left-sided facial droop and slurred speech. In the ER he was noted to have elevated blood pressure. 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 He was started on Cleviprex and transferred to the ICU where he was monitored closely. Eventually hemodynamically improved with stable neuroexam. PT/OT recommended SNF.  Eventually patient started doing well therefore was medically stable to go home.     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  Did not receive any thrombolytics due to unknown downtime.  EEG showed moderate generalized slowing.  Echo showed EF of 60 to 65% with grade 2 DD, LDL 77, A1c 7.8.  Initially patient was treated with Cleviprex and then eventually vital signs and neuroexam stabilized therefore transferred out of the ICU PT/OT has recommended SNF.  But eventually started doing well therefore stable to go home.   Hx of Stroke/TIA Old infarct   Hypertension Patient is now on lisinopril, Norvasc, HCTZ, prescription refilled   Hyperlipidemia Lipitor 10 mg.  LDL 77   Diabetes type II  Uncontrolled A1c 7.8.  Resume home regimen.  Prescriptions refilled   Tobacco Abuse Counseled to quit using this   Dysphagia Working with speech team    Discharge Diagnoses:  Principal Problem:   Acute encephalopathy Active Problems:   ICH (intracerebral hemorrhage) (HCC)   Cerebrovascular accident (CVA) [facility])      Consultations: Neurology  Subjective: Feels great.  Patient wants to go home.  Discharge Exam: Vitals:   04/27/23 0409 04/27/23 0841  BP: 139/63 (!) 151/76  Pulse: 65 62  Resp: 19 18  Temp: 98.7 F (37.1 C) 98.5 F (36.9 C)  SpO2: 91% 96%   Vitals:   04/26/23 2030 04/27/23 0004 04/27/23 0409 04/27/23 0841  BP: (!) 140/73 132/72 139/63 (!) 151/76  Pulse: 92 78 65 62  Resp: 18 18 19 18   Temp: 98.8 F (37.1 C) 98.8 F (37.1 C) 98.7 F (37.1 C) 98.5 F (36.9 C)  TempSrc: Oral Oral Oral Oral  SpO2: 94% 94% 91% 96%  Weight:      Height:        General: Pt is alert, awake, not in acute distress Cardiovascular: RRR, S1/S2 +, no rubs, no gallops Respiratory: CTA bilaterally, no wheezing, no rhonchi Abdominal: Soft, NT, ND, bowel sounds + Extremities: no edema, no cyanosis Patient is a LURD awake oriented X4.  Mentating well without any issues.  Discharge Instructions   Allergies as of 04/27/2023   No Known Allergies      Medication List     STOP taking these medications    furosemide 40 MG tablet Commonly known as: LASIX   Lantus 100 UNIT/ML injection Generic drug: insulin glargine  TAKE these medications    Accu-Chek Softclix Lancets lancets 1 each 3 (three) times daily.   albuterol 108 (90 Base) MCG/ACT inhaler Commonly known as: VENTOLIN HFA Inhale into the lungs every 6 (six) hours as needed for wheezing or shortness of breath.   amLODipine 10 MG tablet Commonly known as: NORVASC Take 1 tablet (10 mg total) by mouth daily.   atorvastatin 10 MG tablet Commonly known as: LIPITOR Take 1 tablet (10 mg total) by  mouth at bedtime.   Blood Glucose Monitor System w/Device Kit 1 each by Does not apply route 3 (three) times daily.   BLOOD GLUCOSE TEST STRIPS Strp 1 each by Does not apply route 3 (three) times daily. Use as directed to check blood sugar. May dispense any manufacturer covered by patient's insurance and fits patient's device.   carvedilol 6.25 MG tablet Commonly known as: COREG Take 1 tablet (6.25 mg total) by mouth 2 (two) times daily.   DULoxetine 30 MG capsule Commonly known as: CYMBALTA Take 1 capsule (30 mg total) by mouth 2 (two) times daily.   fluticasone-salmeterol 115-21 MCG/ACT inhaler Commonly known as: ADVAIR HFA Inhale 2 puffs into the lungs 2 (two) times daily.   gabapentin 400 MG capsule Commonly known as: NEURONTIN Take 400 mg by mouth 3 (three) times daily.   Gvoke HypoPen 2-Pack 1 MG/0.2ML Soaj Generic drug: Glucagon Inject 1 mg into the skin as needed for up to 2 doses (Severe low blood sugar).   hydrochlorothiazide 12.5 MG tablet Commonly known as: HYDRODIURIL Take 1 tablet (12.5 mg total) by mouth daily. Start taking on: April 28, 2023   insulin aspart 100 UNIT/ML FlexPen Commonly known as: NOVOLOG Inject 0-6 Units into the skin 3 (three) times daily with meals. Check Blood Glucose (BG) and inject per scale: BG <150= 0 unit; BG 150-200= 1 unit; BG 201-250= 2 unit; BG 251-300= 3 unit; BG 301-350= 4 unit; BG 351-400= 5 unit; BG >400= 6 unit and Call Primary Care.   insulin glargine-yfgn 100 UNIT/ML Pen Commonly known as: SEMGLEE Inject 15 Units into the skin daily. May substitute as needed per insurance.   Insulin Pen Needle 31G X 8 MM Misc 1 each by Does not apply route 3 (three) times daily.   INSULIN SYRINGE .5CC/30GX1/2" 30G X 1/2" 0.5 ML Misc Use as directed with insulin.   Lancet Device Misc 1 each by Does not apply route 3 (three) times daily. May dispense any manufacturer covered by patient's insurance.   lisinopril 40 MG tablet Commonly  known as: ZESTRIL Take 1 tablet (40 mg total) by mouth daily.   metFORMIN 1000 MG tablet Commonly known as: GLUCOPHAGE Take 1 tablet (1,000 mg total) by mouth 2 (two) times daily.   omeprazole 20 MG capsule Commonly known as: PRILOSEC Take 1 capsule (20 mg total) by mouth every morning.   potassium chloride 10 MEQ CR capsule Commonly known as: MICRO-K Take 10 mEq by mouth daily.        No Known Allergies  You were cared for by a hospitalist during your hospital stay. If you have any questions about your discharge medications or the care you received while you were in the hospital after you are discharged, you can call the unit and asked to speak with the hospitalist on call if the hospitalist that took care of you is not available. Once you are discharged, your primary care physician will handle any further medical issues. Please note that no refills for any discharge medications  will be authorized once you are discharged, as it is imperative that you return to your primary care physician (or establish a relationship with a primary care physician if you do not have one) for your aftercare needs so that they can reassess your need for medications and monitor your lab values.  You were cared for by a hospitalist during your hospital stay. If you have any questions about your discharge medications or the care you received while you were in the hospital after you are discharged, you can call the unit and asked to speak with the hospitalist on call if the hospitalist that took care of you is not available. Once you are discharged, your primary care physician will handle any further medical issues. Please note that NO REFILLS for any discharge medications will be authorized once you are discharged, as it is imperative that you return to your primary care physician (or establish a relationship with a primary care physician if you do not have one) for your aftercare needs so that they can reassess  your need for medications and monitor your lab values.  Please request your Prim.MD to go over all Hospital Tests and Procedure/Radiological results at the follow up, please get all Hospital records sent to your Prim MD by signing hospital release before you go home.  Get CBC, CMP, 2 view Chest X ray checked  by Primary MD during your next visit or SNF MD in 5-7 days ( we routinely change or add medications that can affect your baseline labs and fluid status, therefore we recommend that you get the mentioned basic workup next visit with your PCP, your PCP may decide not to get them or add new tests based on their clinical decision)  On your next visit with your primary care physician please Get Medicines reviewed and adjusted.  If you experience worsening of your admission symptoms, develop shortness of breath, life threatening emergency, suicidal or homicidal thoughts you must seek medical attention immediately by calling 911 or calling your MD immediately  if symptoms less severe.  You Must read complete instructions/literature along with all the possible adverse reactions/side effects for all the Medicines you take and that have been prescribed to you. Take any new Medicines after you have completely understood and accpet all the possible adverse reactions/side effects.   Do not drive, operate heavy machinery, perform activities at heights, swimming or participation in water activities or provide baby sitting services if your were admitted for syncope or siezures until you have seen by Primary MD or a Neurologist and advised to do so again.  Do not drive when taking Pain medications.   Procedures/Studies: ECHOCARDIOGRAM COMPLETE  Result Date: 04/24/2023    ECHOCARDIOGRAM REPORT   Patient Name:   Justin Snow Date of Exam: 04/24/2023 Medical Rec #:  [id number]          Height:       66.0 in Accession #:    [id number]         Weight:       206.8 lb Date of Birth:  1956-05-19          BSA:           2.028 m Patient Age:    66 years           BP:           156/75 mmHg Patient Gender: M  HR:           68 bpm. Exam Location:  Inpatient Procedure: 2D Echo, Cardiac Doppler and Color Doppler Indications:     Stroke I63.9  History:         Patient has no prior history of Echocardiogram examinations.                  Signs/Symptoms:Edema; Risk Factors:Hypertension, Current                  Smoker, Diabetes and Dyslipidemia.  Sonographer:     Aron Baba Referring Phys:  [id number] Gordy Councilman Diagnosing Phys: Yates Decamp MD  Sonographer Comments: Technically difficult study due to poor echo windows. Image acquisition challenging due to respiratory motion, Image acquisition challenging due to uncooperative patient and Image acquisition challenging due to patient body habitus. IMPRESSIONS  1. Poor echo window. Left ventricular ejection fraction, by estimation, is 60 to 65%. The left ventricle has normal function. The left ventricle has no regional wall motion abnormalities. Left ventricular diastolic parameters are consistent with Grade II diastolic dysfunction (pseudonormalization). Elevated left ventricular end-diastolic pressure.  2. Right ventricular systolic function is normal. The right ventricular size is normal. There is normal pulmonary artery systolic pressure.  3. Left atrial size was mildly dilated.  4. The mitral valve is normal in structure. Trivial mitral valve regurgitation. No evidence of mitral stenosis.  5. The aortic valve was not well visualized. There is mild calcification of the aortic valve. Aortic valve regurgitation is not visualized. Mild aortic valve stenosis. Aortic valve area, by VTI measures 1.58 cm. Aortic valve mean gradient measures 12.0  mmHg. Aortic valve Vmax measures 2.36 m/s.  6. The inferior vena cava is normal in size with greater than 50% respiratory variability, suggesting right atrial pressure of 3 mmHg. Conclusion(s)/Recommendation(s): Consider TEE if  clinically indicated. No obvious source of cerebral embolism, poor echo window. FINDINGS  Left Ventricle: Poor echo window. Left ventricular ejection fraction, by estimation, is 60 to 65%. The left ventricle has normal function. The left ventricle has no regional wall motion abnormalities. The left ventricular internal cavity size was normal  in size. There is no left ventricular hypertrophy. Left ventricular diastolic parameters are consistent with Grade II diastolic dysfunction (pseudonormalization). Elevated left ventricular end-diastolic pressure. Right Ventricle: The right ventricular size is normal. No increase in right ventricular wall thickness. Right ventricular systolic function is normal. There is normal pulmonary artery systolic pressure. The tricuspid regurgitant velocity is 0.68 m/s, and  with an assumed right atrial pressure of 3 mmHg, the estimated right ventricular systolic pressure is 4.8 mmHg. Left Atrium: Left atrial size was mildly dilated. Right Atrium: Right atrial size was normal in size. Pericardium: There is no evidence of pericardial effusion. Mitral Valve: The mitral valve is normal in structure. Trivial mitral valve regurgitation. No evidence of mitral valve stenosis. Tricuspid Valve: The tricuspid valve is normal in structure. Tricuspid valve regurgitation is trivial. No evidence of tricuspid stenosis. Aortic Valve: The aortic valve was not well visualized. There is mild calcification of the aortic valve. Aortic valve regurgitation is not visualized. Mild aortic stenosis is present. Aortic valve mean gradient measures 12.0 mmHg. Aortic valve peak gradient measures 22.3 mmHg. Aortic valve area, by VTI measures 1.58 cm. Pulmonic Valve: The pulmonic valve was not well visualized. Pulmonic valve regurgitation is not visualized. Aorta: The aortic root and ascending aorta are structurally normal, with no evidence of dilitation. Venous: The inferior vena cava is normal  in size with greater  than 50% respiratory variability, suggesting right atrial pressure of 3 mmHg. IAS/Shunts: The interatrial septum was not assessed.  LEFT VENTRICLE PLAX 2D LVIDd:         3.70 cm   Diastology LVIDs:         2.60 cm   LV e' medial:    5.44 cm/s LV PW:         0.90 cm   LV E/e' medial:  14.4 LV IVS:        0.90 cm   LV e' lateral:   7.51 cm/s LVOT diam:     2.10 cm   LV E/e' lateral: 10.4 LV SV:         77 LV SV Index:   38 LVOT Area:     3.46 cm  RIGHT VENTRICLE RV S prime:     18.60 cm/s TAPSE (M-mode): 1.7 cm LEFT ATRIUM             Index        RIGHT ATRIUM           Index LA diam:        3.50 cm 1.73 cm/m   RA Area:     15.20 cm LA Vol (A2C):   51.5 ml 25.39 ml/m  RA Volume:   36.40 ml  17.95 ml/m LA Vol (A4C):   33.5 ml 16.52 ml/m LA Biplane Vol: 45.6 ml 22.48 ml/m  AORTIC VALVE AV Area (Vmax):    1.69 cm AV Area (Vmean):   1.65 cm AV Area (VTI):     1.58 cm AV Vmax:           236.00 cm/s AV Vmean:          156.000 cm/s AV VTI:            0.486 m AV Peak Grad:      22.3 mmHg AV Mean Grad:      12.0 mmHg LVOT Vmax:         115.00 cm/s LVOT Vmean:        74.400 cm/s LVOT VTI:          0.221 m LVOT/AV VTI ratio: 0.45  AORTA Ao Root diam: 3.70 cm Ao Asc diam:  3.50 cm MITRAL VALVE                TRICUSPID VALVE MV Area (PHT): 2.05 cm     TR Peak grad:   1.8 mmHg MV Decel Time: 370 msec     TR Vmax:        67.80 cm/s MR Peak grad: 4.1 mmHg MR Vmax:      101.00 cm/s   SHUNTS MV E velocity: 78.10 cm/s   Systemic VTI:  0.22 m MV A velocity: 101.00 cm/s  Systemic Diam: 2.10 cm MV E/A ratio:  0.77 Yates Decamp MD Electronically signed by Yates Decamp MD Signature Date/Time: 04/24/2023/11:00:11 PM    Final    MR BRAIN WO CONTRAST  Result Date: 04/24/2023 CLINICAL DATA:  67 year old male code stroke presentation. Slurred speech. Subcentimeter hypodensity in the left thalamus, hemorrhage versus cavernous malformation. EXAM: MRI HEAD WITHOUT CONTRAST TECHNIQUE: Multiplanar, multiecho pulse sequences of the brain and  surrounding structures were obtained without intravenous contrast. COMPARISON:  CTA head and neck and head CT 0312 hours today. FINDINGS: The examination had to be discontinued prior to completion, the patient was trying to climb out of the scanner. Mildly degraded axial and coronal DWI, axial  T2 and sagittal T1 weighted imaging was obtained. Major intracranial vascular flow voids are preserved. No midline shift. No ventriculomegaly. Cervicomedullary junction and pituitary are within normal limits. Diffusion-weighted imaging is highly heterogeneous at the left thalamus which appears in part due to extensive susceptibility from blood products. And there is a small 4 mm focus of T2 hypointensity corresponding to the hyperdense CT lesion on series 5, image 11. No significant mass effect or edema. 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 Occasional brainstem chronic lacunar infarcts. DWI is positive for a 4-5 mm right corona radiata or pre motor subcortical white matter lacunar infarct on series 2, image 36. No evidence of hemorrhage or mass effect there. No other restricted diffusion. IMPRESSION: 1. Truncated and mildly motion degraded noncontrast MRI. 2. Small acute Left thalamic hemorrhage (4 mm, no edema or mass effect) superimposed on evidence of extensive chronic microhemorrhages in the deep gray nuclei. 3. And positive also for a small acute white matter lacunar infarct in the right centrum semiovale. No associated hemorrhage or mass effect. 4. Underlying advanced chronic small-vessel disease. Salient findings were communicated to Dr. Iver Nestle At 6:46 am on 04/24/2023 by text page via the [facility] messaging system. Electronically Signed   By: Odessa Fleming M.D.   On: 04/24/2023 06:46   DG Chest Port 1 View  Result Date: 04/24/2023 CLINICAL DATA:  Altered mental status with slurred speech and left-sided facial droop. EXAM:  PORTABLE CHEST 1 VIEW COMPARISON:  October 01, 2022 FINDINGS: The heart size and mediastinal contours are within normal limits. Mild atelectasis is seen within the bilateral lung bases. No pleural effusions no pneumothorax is identified. Multilevel degenerative changes are seen within the lower thoracic spine. IMPRESSION: Mild bibasilar atelectasis. Electronically Signed   By: Aram Candela M.D.   On: 04/24/2023 04:29   CT ANGIO HEAD NECK W WO CM (CODE STROKE)  Result Date: 04/24/2023 CLINICAL DATA:  Slurred speech EXAM: CT ANGIOGRAPHY HEAD AND NECK WITH AND WITHOUT CONTRAST TECHNIQUE: Multidetector CT imaging of the head and neck was performed using the standard protocol during bolus administration of intravenous contrast. Multiplanar CT image reconstructions and MIPs were obtained to evaluate the vascular anatomy. Carotid stenosis measurements (when applicable) are obtained utilizing NASCET criteria, using the distal internal carotid diameter as the denominator. RADIATION DOSE REDUCTION: This exam was performed according to the departmental dose-optimization program which includes automated exposure control, adjustment of the mA and/or kV according to patient size and/or use of iterative reconstruction technique. CONTRAST:  75mL OMNIPAQUE IOHEXOL 350 MG/ML SOLN COMPARISON:  None Available. FINDINGS: CTA NECK FINDINGS SKELETON: There is no bony spinal canal stenosis. No lytic or blastic lesion. OTHER NECK: Normal pharynx, larynx and major salivary glands. No cervical lymphadenopathy. Unremarkable thyroid gland. UPPER CHEST: Biapical emphysema AORTIC ARCH: There is calcific atherosclerosis of the aortic arch. Conventional 3 vessel aortic branching pattern. RIGHT CAROTID SYSTEM: No dissection, occlusion or aneurysm. Mild atherosclerotic calcification at the carotid bifurcation without hemodynamically significant stenosis. LEFT CAROTID SYSTEM: No dissection, occlusion or aneurysm. Mild atherosclerotic  calcification at the carotid bifurcation without hemodynamically significant stenosis. VERTEBRAL ARTERIES: Left dominant configuration.There is no dissection, occlusion or flow-limiting stenosis to the skull base (V1-V3 segments). CTA HEAD FINDINGS POSTERIOR CIRCULATION: --Vertebral arteries: Normal V4 segments. --Inferior cerebellar arteries: Normal. --Basilar artery: Normal. --Superior cerebellar arteries: Normal. --Posterior cerebral arteries (PCA): Severe stenosis at the right P1 P2 junction. Fetal origin of the left PCA.  ANTERIOR CIRCULATION: --Intracranial internal carotid arteries: Atherosclerotic calcification of the internal carotid arteries at the skull base without hemodynamically significant stenosis. --Anterior cerebral arteries (ACA): Normal. Both A1 segments are present. Patent anterior communicating artery (a-comm). 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 VENOUS SINUSES: As permitted by contrast timing, patent. ANATOMIC VARIANTS: Fetal origin of the left posterior cerebral artery. Review of the MIP images confirms the above findings. IMPRESSION: 1. No emergent large vessel occlusion. 2. 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 3. Severe stenosis at the right PCA P1-P2 junction. 4. Mild bilateral carotid bifurcation atherosclerosis without hemodynamically significant stenosis. Aortic Atherosclerosis (ICD10-I70.0). Electronically Signed   By: Deatra Robinson M.D.   On: 04/24/2023 03:46   CT HEAD CODE STROKE WO CONTRAST  Result Date: 04/24/2023 CLINICAL DATA:  Code stroke.  Altered mental status EXAM: CT HEAD WITHOUT CONTRAST TECHNIQUE: Contiguous axial images were obtained from the base of the skull through the vertex without intravenous contrast. RADIATION DOSE REDUCTION: This exam was performed according to the departmental dose-optimization program which includes automated exposure control, adjustment  of the mA and/or kV according to patient size and/or use of iterative reconstruction technique. COMPARISON:  None Available. FINDINGS: Brain: 5 mm hyperdense focus of the left thalamus without surrounding edema. The size and configuration of the ventricles and extra-axial CSF spaces are normal. There is hypoattenuation of the periventricular white matter, most commonly indicating chronic ischemic microangiopathy. 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 Vascular: No abnormal hyperdensity of the major intracranial arteries or dural venous sinuses. No intracranial atherosclerosis. Skull: The visualized skull base, calvarium and extracranial soft tissues are normal. Sinuses/Orbits: No fluid levels or advanced mucosal thickening of the visualized paranasal sinuses. No mastoid or middle ear effusion. The orbits are normal. ASPECTS (Alberta Stroke Program Early CT Score) - Ganglionic level infarction (caudate, lentiform nuclei, internal capsule, insula, M1-M3 cortex): 7 - Supraganglionic infarction (M4-M6 cortex): 3 Total score (0-10 with 10 being normal): 10 IMPRESSION: 1. A 5 mm hyperdense focus of the left thalamus without surrounding edema. 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 2. ASPECTS is 10. 3. 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 These results were communicated to Dr. Brooke Dare at 3:17 am on 04/24/2023 by text page via the [facility] messaging system. Electronically Signed   By: Deatra Robinson M.D.   On: 04/24/2023 03:18     The results of significant diagnostics from this hospitalization (including imaging, microbiology, ancillary and laboratory) are listed below for reference.     Microbiology: Recent Results (from the past 240 hour(s))  MRSA Next Gen by PCR, Nasal     Status: None   Collection Time: 04/24/23 12:26 PM   Specimen: Nasal Mucosa; Nasal Swab  Result Value Ref Range Status   MRSA by PCR Next Gen NOT DETECTED NOT DETECTED Final    Comment: (NOTE) The  GeneXpert MRSA Assay (FDA approved for NASAL specimens only), is one component of a comprehensive MRSA colonization surveillance program. It is not intended to diagnose MRSA infection nor to guide or monitor treatment for MRSA infections. Test performance is not FDA approved in patients less than 35 years old. Performed at [facility], [address], [place], [place] [address]      Labs: BNP (last 3 results) No results for input(s): "BNP" in the last 8760 hours. Basic Metabolic Panel: Recent Labs  Lab 04/24/23 0310  04/24/23 0313 04/25/23 0451 04/27/23 1110  NA 138 141 139 134*  K 3.4* 3.4* 3.4* 4.2  CL 108 108 108 103  CO2 20*  --  20* 23  GLUCOSE 207* 205* 118* 129*  BUN 28* 28* 22 25*  CREATININE 1.43* 1.50* 1.36* 1.49*  CALCIUM 8.7*  --  8.6* 8.5*  MG  --   --   --  1.8   Liver Function Tests: Recent Labs  Lab 04/24/23 0310  AST 25  ALT 20  ALKPHOS 118  BILITOT 0.6  PROT 6.3*  ALBUMIN 3.5   No results for input(s): "LIPASE", "AMYLASE" in the last 168 hours. No results for input(s): "AMMONIA" in the last 168 hours. CBC: Recent Labs  Lab 04/24/23 0310 04/24/23 0313 04/27/23 1110  WBC 9.0  --  10.0  NEUTROABS 6.0  --   --   HGB 12.3* 11.9* 11.8*  HCT 38.0* 35.0* 35.9*  MCV 90.9  --  86.3  PLT 161  --  175   Cardiac Enzymes: No results for input(s): "CKTOTAL", "CKMB", "CKMBINDEX", "TROPONINI" in the last 168 hours. BNP: Invalid input(s): "POCBNP" CBG: Recent Labs  Lab 04/26/23 0728 04/26/23 1254 04/26/23 1648 04/26/23 2305 04/27/23 0645  GLUCAP 108* 233* 162* 205* 231*   D-Dimer No results for input(s): "DDIMER" in the last 72 hours. Hgb A1c No results for input(s): "HGBA1C" in the last 72 hours. Lipid Profile No results for input(s): "CHOL", "HDL", "LDLCALC", "TRIG", "CHOLHDL", "LDLDIRECT" in the last 72 hours. Thyroid function studies No results for input(s): "TSH", "T4TOTAL", "T3FREE", "THYROIDAB" in the last 72 hours.  Invalid  input(s): "FREET3" Anemia work up No results for input(s): "VITAMINB12", "FOLATE", "FERRITIN", "TIBC", "IRON", "RETICCTPCT" in the last 72 hours. Urinalysis    Component Value Date/Time   COLORURINE STRAW (A) 04/24/2023 0639   APPEARANCEUR CLEAR 04/24/2023 0639   LABSPEC 1.017 04/24/2023 0639   PHURINE 6.0 04/24/2023 0639   GLUCOSEU 50 (A) 04/24/2023 0639   HGBUR SMALL (A) 04/24/2023 0639   BILIRUBINUR NEGATIVE 04/24/2023 0639   KETONESUR NEGATIVE 04/24/2023 0639   PROTEINUR >=300 (A) 04/24/2023 0639   NITRITE NEGATIVE 04/24/2023 0639   LEUKOCYTESUR NEGATIVE 04/24/2023 0639   Sepsis Labs Recent Labs  Lab 04/24/23 0310 04/27/23 1110  WBC 9.0 10.0   Microbiology Recent Results (from the past 240 hour(s))  MRSA Next Gen by PCR, Nasal     Status: None   Collection Time: 04/24/23 12:26 PM   Specimen: Nasal Mucosa; Nasal Swab  Result Value Ref Range Status   MRSA by PCR Next Gen NOT DETECTED NOT DETECTED Final    Comment: (NOTE) The GeneXpert MRSA Assay (FDA approved for NASAL specimens only), is one component of a comprehensive MRSA colonization surveillance program. It is not intended to diagnose MRSA infection nor to guide or monitor treatment for MRSA infections. Test performance is not FDA approved in patients less than 9 years old. Performed at [facility], [address], [place], [place] [address]      Time coordinating discharge:  I have spent 35 minutes face to face with the patient and on the ward discussing the patients care, assessment, plan and disposition with other care givers. >50% of the time was devoted counseling the patient about the risks and benefits of treatment/Discharge disposition and coordinating care.   SIGNED:   Dimple Nanas, MD  [facility] 04/27/2023, 1:54 PM   If 7PM-7AM, please contact night-coverage

## 2023-04-27 NOTE — Plan of Care (Signed)
Problem: Education: Goal: Knowledge of disease or condition will improve 04/27/2023 1619 by Juluis Mire, RN Outcome: Adequate for Discharge 04/27/2023 1156 by Juluis Mire, RN Outcome: Adequate for Discharge Goal: Knowledge of secondary prevention will improve (MUST DOCUMENT ALL) 04/27/2023 1619 by Juluis Mire, RN Outcome: Adequate for Discharge 04/27/2023 1156 by Juluis Mire, RN Outcome: Adequate for Discharge Goal: Knowledge of patient specific risk factors will improve Loraine Leriche N/A or DELETE if not current risk factor) 04/27/2023 1619 by Juluis Mire, RN Outcome: Adequate for Discharge 04/27/2023 1156 by Juluis Mire, RN Outcome: Adequate for Discharge   Problem: Intracerebral Hemorrhage Tissue Perfusion: Goal: Complications of Intracerebral Hemorrhage will be minimized 04/27/2023 1619 by Juluis Mire, RN Outcome: Adequate for Discharge 04/27/2023 1156 by Juluis Mire, RN Outcome: Adequate for Discharge   Problem: Coping: Goal: Will verbalize positive feelings about self 04/27/2023 1619 by Juluis Mire, RN Outcome: Adequate for Discharge 04/27/2023 1156 by Juluis Mire, RN Outcome: Adequate for Discharge Goal: Will identify appropriate support needs 04/27/2023 1619 by Juluis Mire, RN Outcome: Adequate for Discharge 04/27/2023 1156 by Juluis Mire, RN Outcome: Adequate for Discharge   Problem: Health Behavior/Discharge Planning: Goal: Ability to manage health-related needs will improve 04/27/2023 1619 by Juluis Mire, RN Outcome: Adequate for Discharge 04/27/2023 1156 by Juluis Mire, RN Outcome: Adequate for Discharge Goal: Goals will be collaboratively established with patient/family 04/27/2023 1619 by Juluis Mire, RN Outcome: Adequate for Discharge 04/27/2023 1156 by Juluis Mire, RN Outcome: Adequate for Discharge   Problem: Self-Care: Goal: Ability to participate  in self-care as condition permits will improve 04/27/2023 1619 by Juluis Mire, RN Outcome: Adequate for Discharge 04/27/2023 1156 by Juluis Mire, RN Outcome: Adequate for Discharge Goal: Verbalization of feelings and concerns over difficulty with self-care will improve 04/27/2023 1619 by Juluis Mire, RN Outcome: Adequate for Discharge 04/27/2023 1156 by Juluis Mire, RN Outcome: Adequate for Discharge Goal: Ability to communicate needs accurately will improve 04/27/2023 1619 by Juluis Mire, RN Outcome: Adequate for Discharge 04/27/2023 1156 by Juluis Mire, RN Outcome: Adequate for Discharge   Problem: Nutrition: Goal: Risk of aspiration will decrease 04/27/2023 1619 by Juluis Mire, RN Outcome: Adequate for Discharge 04/27/2023 1156 by Juluis Mire, RN Outcome: Adequate for Discharge Goal: Dietary intake will improve 04/27/2023 1619 by Juluis Mire, RN Outcome: Adequate for Discharge 04/27/2023 1156 by Juluis Mire, RN Outcome: Adequate for Discharge   Problem: Education: Goal: Knowledge of General Education information will improve Description: Including pain rating scale, medication(s)/side effects and non-pharmacologic comfort measures 04/27/2023 1619 by Juluis Mire, RN Outcome: Adequate for Discharge 04/27/2023 1156 by Juluis Mire, RN Outcome: Adequate for Discharge   Problem: Health Behavior/Discharge Planning: Goal: Ability to manage health-related needs will improve 04/27/2023 1619 by Juluis Mire, RN Outcome: Adequate for Discharge 04/27/2023 1156 by Juluis Mire, RN Outcome: Adequate for Discharge   Problem: Clinical Measurements: Goal: Ability to maintain clinical measurements within normal limits will improve 04/27/2023 1619 by Juluis Mire, RN Outcome: Adequate for Discharge 04/27/2023 1156 by Juluis Mire, RN Outcome: Adequate for Discharge Goal: Will  remain free from infection 04/27/2023 1619 by Juluis Mire, RN Outcome: Adequate for Discharge 04/27/2023 1156 by Juluis Mire, RN Outcome: Adequate for Discharge Goal: Diagnostic test results will improve 04/27/2023 1619 by Juluis Mire, RN Outcome: Adequate for Discharge 04/27/2023 1156 by Juluis Mire,  RN Outcome: Adequate for Discharge Goal: Respiratory complications will improve 04/27/2023 1619 by Juluis Mire, RN Outcome: Adequate for Discharge 04/27/2023 1156 by Juluis Mire, RN Outcome: Adequate for Discharge Goal: Cardiovascular complication will be avoided 04/27/2023 1619 by Juluis Mire, RN Outcome: Adequate for Discharge 04/27/2023 1156 by Juluis Mire, RN Outcome: Adequate for Discharge   Problem: Activity: Goal: Risk for activity intolerance will decrease 04/27/2023 1619 by Juluis Mire, RN Outcome: Adequate for Discharge 04/27/2023 1156 by Juluis Mire, RN Outcome: Adequate for Discharge   Problem: Nutrition: Goal: Adequate nutrition will be maintained 04/27/2023 1619 by Juluis Mire, RN Outcome: Adequate for Discharge 04/27/2023 1156 by Juluis Mire, RN Outcome: Adequate for Discharge   Problem: Coping: Goal: Level of anxiety will decrease 04/27/2023 1619 by Juluis Mire, RN Outcome: Adequate for Discharge 04/27/2023 1156 by Juluis Mire, RN Outcome: Adequate for Discharge   Problem: Elimination: Goal: Will not experience complications related to bowel motility 04/27/2023 1619 by Juluis Mire, RN Outcome: Adequate for Discharge 04/27/2023 1156 by Juluis Mire, RN Outcome: Adequate for Discharge Goal: Will not experience complications related to urinary retention 04/27/2023 1619 by Juluis Mire, RN Outcome: Adequate for Discharge 04/27/2023 1156 by Juluis Mire, RN Outcome: Adequate for Discharge   Problem: Pain Managment: Goal: General  experience of comfort will improve 04/27/2023 1619 by Juluis Mire, RN Outcome: Adequate for Discharge 04/27/2023 1156 by Juluis Mire, RN Outcome: Adequate for Discharge   Problem: Safety: Goal: Ability to remain free from injury will improve 04/27/2023 1619 by Juluis Mire, RN Outcome: Adequate for Discharge 04/27/2023 1156 by Juluis Mire, RN Outcome: Adequate for Discharge   Problem: Skin Integrity: Goal: Risk for impaired skin integrity will decrease 04/27/2023 1619 by Juluis Mire, RN Outcome: Adequate for Discharge 04/27/2023 1156 by Juluis Mire, RN Outcome: Adequate for Discharge   Problem: Education: Goal: Ability to describe self-care measures that may prevent or decrease complications (Diabetes Survival Skills Education) will improve 04/27/2023 1619 by Juluis Mire, RN Outcome: Adequate for Discharge 04/27/2023 1156 by Juluis Mire, RN Outcome: Adequate for Discharge Goal: Individualized Educational Video(s) 04/27/2023 1619 by Juluis Mire, RN Outcome: Adequate for Discharge 04/27/2023 1156 by Juluis Mire, RN Outcome: Adequate for Discharge   Problem: Coping: Goal: Ability to adjust to condition or change in health will improve 04/27/2023 1619 by Juluis Mire, RN Outcome: Adequate for Discharge 04/27/2023 1156 by Juluis Mire, RN Outcome: Adequate for Discharge   Problem: Fluid Volume: Goal: Ability to maintain a balanced intake and output will improve 04/27/2023 1619 by Juluis Mire, RN Outcome: Adequate for Discharge 04/27/2023 1156 by Juluis Mire, RN Outcome: Adequate for Discharge   Problem: Health Behavior/Discharge Planning: Goal: Ability to identify and utilize available resources and services will improve 04/27/2023 1619 by Juluis Mire, RN Outcome: Adequate for Discharge 04/27/2023 1156 by Juluis Mire, RN Outcome: Adequate for Discharge Goal:  Ability to manage health-related needs will improve 04/27/2023 1619 by Juluis Mire, RN Outcome: Adequate for Discharge 04/27/2023 1156 by Juluis Mire, RN Outcome: Adequate for Discharge   Problem: Metabolic: Goal: Ability to maintain appropriate glucose levels will improve 04/27/2023 1619 by Juluis Mire, RN Outcome: Adequate for Discharge 04/27/2023 1156 by Juluis Mire, RN Outcome: Adequate for Discharge   Problem: Nutritional: Goal: Maintenance of adequate nutrition will improve 04/27/2023 1619 by Juluis Mire, RN Outcome: Adequate  for Discharge 04/27/2023 1156 by Juluis Mire, RN Outcome: Adequate for Discharge Goal: Progress toward achieving an optimal weight will improve 04/27/2023 1619 by Juluis Mire, RN Outcome: Adequate for Discharge 04/27/2023 1156 by Juluis Mire, RN Outcome: Adequate for Discharge   Problem: Skin Integrity: Goal: Risk for impaired skin integrity will decrease 04/27/2023 1619 by Juluis Mire, RN Outcome: Adequate for Discharge 04/27/2023 1156 by Juluis Mire, RN Outcome: Adequate for Discharge   Problem: Tissue Perfusion: Goal: Adequacy of tissue perfusion will improve 04/27/2023 1619 by Juluis Mire, RN Outcome: Adequate for Discharge 04/27/2023 1156 by Juluis Mire, RN Outcome: Adequate for Discharge

## 2023-04-27 NOTE — Consult Note (Signed)
Psychiatry consult received for competency for discharge, ordered placed by [facility]. Primary Attending Nelson Chimes) has advised patient is being discharged and consult is no longer needed.   Psychiatry to sign off at this time.

## 2023-04-27 NOTE — Progress Notes (Signed)
Physical Therapy Treatment Patient Details Name: Justin Snow MRN: [id number] DOB: 1956-05-05 Today's Date: 04/27/2023   History of Present Illness Pt is a 67 y.o. M who presents 04/24/2023 after being found under his car with altered mental status and slurred speech. Found to have severe HTN for which code stroke was activated. While in ED, pt found to have episodes of bradycardia with no other symptoms associated. 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 Significant PMH: HTN, HLD, diabetes, smoking, alcohol use history.    PT Comments  Pt seen for PT tx with pt agreeable. Pt more alert & mobilizing better today compared to yesterday. Pt is able to transfer STS without assistance, ambulate around the unit without AD with supervision. Pt does present with decreased weight shift to RLE during stance phase. Pt negotiates stairs with B rails & supervision. Pt completed Berg Balance Test & PT educated pt on interpretation of score & current fall risk. Patient demonstrates increased fall risk as noted by score of 42/56 on Berg Balance Scale.  (<36= high risk for falls, close to 100%; 37-45 significant >80%; 46-51 moderate >50%; 52-55 lower >25%). Currently, pt's physical mobility is negatively impacted by his impaired cognition & awareness.     If plan is discharge home, recommend the following: A little help with walking and/or transfers;A little help with bathing/dressing/bathroom;Assistance with cooking/housework;Assist for transportation;Help with stairs or ramp for entrance;Direct supervision/assist for medications management   Can travel by private vehicle     Yes  Equipment Recommendations  Rolling walker (2 wheels)    Recommendations for Other Services       Precautions / Restrictions Precautions Precautions: Fall Restrictions Weight Bearing Restrictions: No     Mobility  Bed Mobility                General bed mobility comments: not tested, pt received EOB & left sitting in recliner    Transfers Overall transfer level: Modified independent Equipment used: None Transfers: Sit to/from Stand Sit to Stand: Modified independent (Device/Increase time)                Ambulation/Gait Ambulation/Gait assistance: Supervision Gait Distance (Feet): 200 Feet Assistive device: None Gait Pattern/deviations: Decreased stance time - right, Decreased weight shift to right, Step-through pattern Gait velocity: decreased     General Gait Details: No overt LOB   Stairs Stairs: Yes Stairs assistance: Supervision Stair Management: Two rails, Alternating pattern, Forwards Number of Stairs: 10 (3" + 6")     Wheelchair Mobility     Tilt Bed    Modified Rankin (Stroke Patients Only)       Balance                                 Standardized Balance Assessment Standardized Balance Assessment : Berg Balance Test Berg Balance Test Sit to Stand: Able to stand without using hands and stabilize independently Standing Unsupported: Able to stand safely 2 minutes Sitting with Back Unsupported but Feet Supported on Floor or Stool: Able to sit safely and securely 2 minutes Stand to Sit: Sits safely with minimal use of hands Transfers: Able to transfer safely, definite need of hands Standing Unsupported with Eyes Closed: Able to stand 10 seconds safely Standing Ubsupported with Feet Together: Able to place feet together independently and stand 1 minute safely From Standing, Reach Forward with Outstretched Arm: Can  reach confidently >25 cm (10") From Standing Position, Pick up Object from Floor: Unable to pick up shoe, but reaches 2-5 cm (1-2") from shoe and balances independently (LOB but no assistance to prevent fall) From Standing Position, Turn to Look Behind Over each Shoulder: Looks behind from both sides and weight shifts well Turn 360 Degrees: Able to turn 360  degrees safely in 4 seconds or less Standing Unsupported, Alternately Place Feet on Step/Stool: Able to complete >2 steps/needs minimal assist Standing Unsupported, One Foot in Front: Loses balance while stepping or standing (attempts heel to toe) Standing on One Leg: Unable to try or needs assist to prevent fall Total Score: 42        Cognition Arousal/Alertness: Awake/alert Behavior During Therapy: Flat affect Overall Cognitive Status: No family/caregiver present to determine baseline cognitive functioning Area of Impairment: Attention, Following commands, Awareness, Safety/judgement, Problem solving, Orientation                 Orientation Level:  (oriented to month & year but not date)   Memory: Decreased short-term memory Following Commands: Follows one step commands consistently, Follows one step commands with increased time Safety/Judgement: Decreased awareness of safety, Decreased awareness of deficits Awareness: Emergent            Exercises      General Comments        Pertinent Vitals/Pain Pain Assessment Pain Assessment: No/denies pain    Home Living                          Prior Function            PT Goals (current goals can now be found in the care plan section) Acute Rehab PT Goals Patient Stated Goal: did not state PT Goal Formulation: With patient Time For Goal Achievement: 05/09/23 Potential to Achieve Goals: Good Progress towards PT goals: Progressing toward goals    Frequency    Min 1X/week      PT Plan Discharge plan needs to be updated    Co-evaluation              AM-PAC PT "6 Clicks" Mobility   Outcome Measure  Help needed turning from your back to your side while in a flat bed without using bedrails?: None Help needed moving from lying on your back to sitting on the side of a flat bed without using bedrails?: None Help needed moving to and from a bed to a chair (including a wheelchair)?: None Help  needed standing up from a chair using your arms (e.g., wheelchair or bedside chair)?: None Help needed to walk in hospital room?: A Little Help needed climbing 3-5 steps with a railing? : A Little 6 Click Score: 22    End of Session   Activity Tolerance: Patient tolerated treatment well Patient left: in chair;with call bell/phone within reach   PT Visit Diagnosis: Unsteadiness on feet (R26.81);Difficulty in walking, not elsewhere classified (R26.2);Muscle weakness (generalized) (M62.81)     Time: 0865-7846 PT Time Calculation (min) (ACUTE ONLY): 14 min  Charges:    $Therapeutic Activity: 8-22 mins PT General Charges $$ ACUTE PT VISIT: 1 Visit                     Aleda Grana, PT, DPT 04/27/23, 3:29 PM   Sandi Mariscal 04/27/2023, 3:28 PM

## 2023-04-27 NOTE — Progress Notes (Signed)
Unable to get his medications filled per [facility] pharmacy. Pts medicaid is saying its being blocked by medicare. Pt doesn't know he has medicare. CM called and found he has medicare a but his b was cancelled. CM attempted to call Social security to see about getting this reinstated so his medications can be filled but unable to talk with a representative today. Cm has updated the MD and pharmacy. Will try again in am.

## 2023-04-27 NOTE — Progress Notes (Signed)
STROKE TEAM PROGRESS NOTE   BRIEF HPI Mr. Justin Snow is a 67 y.o. male with history significant for essential hypertension, hyperlipidemia, type 2 diabetes mellitus, smoking, alcohol use history presenting with altered mental status after a gas station attendant witnessed the patient walking around prior to being found lying under his car confused with left facial droop and slurred speech.  While in the ED, patient had significant hypertension with SBP in the 220s.  Last known well time was reported as midnight but unable to be confirmed.  SIGNIFICANT HOSPITAL EVENTS 7/28: Admitted for AMS, unclear LKW, hypertensive requiring cleviprex, small acute left thalamic hemorrhage on imaging, small acute white matter lacunar infarct  INTERIM HISTORY/SUBJECTIVE Patient is seen in his room with no family at the bedside.  He wants to go home.  He seems oriented alert and interactive today.  Psychiatry consult was placed to assess competence but primary MD.  Not necessary and canceled  OBJECTIVE  CBC    Component Value Date/Time   WBC 10.0 04/27/2023 1110   RBC 4.16 (L) 04/27/2023 1110   HGB 11.8 (L) 04/27/2023 1110   HCT 35.9 (L) 04/27/2023 1110   PLT 175 04/27/2023 1110   MCV 86.3 04/27/2023 1110   MCH 28.4 04/27/2023 1110   MCHC 32.9 04/27/2023 1110   RDW 14.2 04/27/2023 1110   LYMPHSABS 1.8 04/24/2023 0310   MONOABS 0.8 04/24/2023 0310   EOSABS 0.3 04/24/2023 0310   BASOSABS 0.1 04/24/2023 0310   BMET    Component Value Date/Time   NA 134 (L) 04/27/2023 1110   K 4.2 04/27/2023 1110   CL 103 04/27/2023 1110   CO2 23 04/27/2023 1110   GLUCOSE 129 (H) 04/27/2023 1110   BUN 25 (H) 04/27/2023 1110   CREATININE 1.49 (H) 04/27/2023 1110   CALCIUM 8.5 (L) 04/27/2023 1110   GFRNONAA 51 (L) 04/27/2023 1110   Lab Results  Component Value Date   HGBA1C 7.8 (H) 04/24/2023   Lab Results  Component Value Date   CHOL 138 04/24/2023   HDL 35 (L) 04/24/2023   LDLCALC 77 04/24/2023    TRIG 132 04/24/2023   CHOLHDL 3.9 04/24/2023    Drugs of Abuse     Component Value Date/Time   LABOPIA NONE DETECTED 04/24/2023 0639   COCAINSCRNUR NONE DETECTED 04/24/2023 0639   LABBENZ NONE DETECTED 04/24/2023 0639   AMPHETMU NONE DETECTED 04/24/2023 0639   THCU NONE DETECTED 04/24/2023 0639   LABBARB NONE DETECTED 04/24/2023 0639    Alcohol Level    Component Value Date/Time   ETH <10 04/24/2023 0310   IMAGING past 24 hours No results found.  Vitals:   04/26/23 2030 04/27/23 0004 04/27/23 0409 04/27/23 0841  BP: (!) 140/73 132/72 139/63 (!) 151/76  Pulse: 92 78 65 62  Resp: 18 18 19 18   Temp: 98.8 F (37.1 C) 98.8 F (37.1 C) 98.7 F (37.1 C) 98.5 F (36.9 C)  TempSrc: Oral Oral Oral Oral  SpO2: 94% 94% 91% 96%  Weight:      Height:       PHYSICAL EXAM General:  Alert, well-nourished, well-developed Caucasian male patient in no acute distress Psych: Affect is normal and appropriate to situation CV: Regular rate and rhythm on monitor Respiratory:  Regular, unlabored respirations on room air GI: Abdomen soft and nontender   NEURO:  Mental Status: Patient is alert and oriented to person place time and situation, but still not able to give much history of the events that led  up to his hospitalization  Cranial Nerves:  II: PERRL. Visual fields full.  III, IV, VI: EOMI.  V: Sensation is intact to light touch and symmetrical to face.  VII: Left facial weakness is noted VIII: Hearing intact to voice. IX, X:  Phonation is normal.  IO:[id number] shrug 5/5. XII: Tongue protrudes midline Motor: Limited cooperation with confrontational strength testing. Patient does have some left grip weakness, left arm weakness compared to the right.  Diminished fine finger movements on the left with right hand orbiting left Sensation: Patient reports equal sensation to light touch bilaterally Coordination: FTN intact bilaterally Gait: Deferred for patient  safety  ASSESSMENT/PLAN  Intracerebral Hemorrhage: Small acute left thalamic hemorrhage, likely hypertensive in etiology versus less likely hemorrhagic transformation of ischemic infarct. Small acute lacunar white matter infarct in the right centrum semiovale due to small vessel disease CT head 7/28: 5 mm hyperdense focus in the left thalamus without surrounding edema. Aspects is 10.  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 CTA head & neck 7/28: No emergent LVO.  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  Mild bilateral carotid bifurcation atherosclerosis without hemodynamically significant stenosis. MRI 7/28: Truncated and mildly motion degraded noncontrast MRI.  Small acute left thalamic hemorrhage superimposed on evidence of extensive chronic microhemorrhages in the deep gray nuclei.  Small acute white matter lacunar infarct in the right centrum semiovale.  No associated hemorrhage or mass effect.  Underlying advanced chronic small vessel disease. EEG mild to moderate generalized slowing. 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 identified on CT head imaging 04/24/2023 MRI brain 04/24/2023 with evidence of numerous lacunar infarcts in addition to the evidence of blood products with occasional brainstem chronic lacunar infarcts Patient with no reported or documented history of strokes or previous stroke symptoms  Hypertension Home meds:  amlodipine, carvedilol  Cleviprex drip now off PRN labetalol  Blood Pressure Goal: SBP between 130-150 for 24 hours and then less than 160    Hyperlipidemia Home meds:  atorvastatin 10 mg LDL 77., goal < 70 Begin high intensity statin at discharge  Diabetes type II Uncontrolled Home meds:  Metformin, lantus  HgbA1c 7.8., goal < 7.0 CBGs SSI Recommend close follow-up with PCP for better DM control  Tobacco Abuse Patient smokes 1 packs per day for many years      Ready to quit? 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 Yes   Advance diet as tolerated  Other Stroke Risk Factors Obesity, Body mass index is 32.35 kg/m., BMI >/= 30 associated with increased stroke risk, recommend weight loss, diet and exercise as appropriate   Other Active Problems History of alcohol use Serum EtOH without evidence of intoxication on arrival Patient denies recent alcohol use  Hospital day # 3   Patient lives alone and therapist recommend short-term rehab likely in a skilled nursing  setting.  Patient seems unhappy about this.  He wants to go home.  He is on a nicotine patch.  Patient counseled to stay.  Stroke team will sign off.  Kindly call for questions Delia Heady, MD Medical Director [facility] Pager: [phone] 04/27/2023 1:19 PM   To contact Stroke Continuity provider, please refer to [email or web address]. After hours, contact General Neurology

## 2023-04-28 ENCOUNTER — Other Ambulatory Visit (HOSPITAL_COMMUNITY): Payer: Self-pay

## 2023-04-28 LAB — GLUCOSE, CAPILLARY
Glucose-Capillary: 190 mg/dL — ABNORMAL HIGH (ref 70–99)
Glucose-Capillary: 230 mg/dL — ABNORMAL HIGH (ref 70–99)
Glucose-Capillary: 111 mg/dL — ABNORMAL HIGH (ref 70–99)

## 2023-04-28 NOTE — Progress Notes (Signed)
Occupational Therapy Treatment Patient Details Name: Justin Snow MRN: [id number] DOB: June 16, 1956 Today's Date: 04/28/2023   History of present illness Pt is a 67 y.o. M who presents 04/24/2023 after being found under his car with altered mental status and slurred speech. Found to have severe HTN for which code stroke was activated. While in ED, pt found to have episodes of bradycardia with no other symptoms associated. 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 Significant PMH: HTN, HLD, diabetes, smoking, alcohol use history.   OT comments  Patient supine in bed, lethargic and requires encouragement to participate.  Patient completing transfers and mobility with supervision today, toileting/grooming/LB dressing with supervision.  He demonstrates poor ability to follow multiple step commands, dual task and recall after several minutes.  Pt with poor plan for assist with medication and meals at dc, reached out to CM to determine options.  Recommend assist with all IADLs at dc (including meals, medications, finances, and driving). Will follow acutely.    Recommendations for follow up therapy are one component of a multi-disciplinary discharge planning process, led by the attending physician.  Recommendations may be updated based on patient status, additional functional criteria and insurance authorization.    Assistance Recommended at Discharge Frequent or constant Supervision/Assistance  Patient can return home with the following  Assistance with cooking/housework;Direct supervision/assist for medications management;Direct supervision/assist for financial management;Assist for transportation;A little help with bathing/dressing/bathroom   Equipment Recommendations  BSC/3in1    Recommendations for Other Services      Precautions / Restrictions Precautions Precautions: Fall Restrictions Weight Bearing Restrictions: No        Mobility Bed Mobility Overal bed mobility: Needs Assistance Bed Mobility: Supine to Sit     Supine to sit: Min guard     General bed mobility comments: to initate OOB, but no physical assist required    Transfers Overall transfer level: Needs assistance Equipment used: None Transfers: Sit to/from Stand Sit to Stand: Supervision           General transfer comment: safety     Balance Overall balance assessment: Needs assistance Sitting-balance support: No upper extremity supported, Feet supported Sitting balance-Leahy Scale: Good     Standing balance support: During functional activity, No upper extremity supported Standing balance-Leahy Scale: Fair                             ADL either performed or assessed with clinical judgement   ADL Overall ADL's : Needs assistance/impaired     Grooming: Wash/dry hands;Supervision/safety;Standing               Lower Body Dressing: Supervision/safety;Sit to/from stand   Toilet Transfer: Supervision/safety;Ambulation   Toileting- Clothing Manipulation and Hygiene: Supervision/safety;Sit to/from stand       Functional mobility during ADLs: Supervision/safety;Cueing for safety      Extremity/Trunk Assessment              Vision       Perception     Praxis      Cognition Arousal/Alertness: Awake/alert Behavior During Therapy: Flat affect Overall Cognitive Status: No family/caregiver present to determine baseline cognitive functioning Area of Impairment: Attention, Following commands, Awareness, Safety/judgement, Problem solving, Memory                   Current Attention Level: Sustained Memory: Decreased recall of precautions, Decreased short-term memory Following Commands: Follows one  step commands consistently, Follows one step commands with increased time, Follows multi-step commands inconsistently Safety/Judgement: Decreased awareness of safety, Decreased awareness of  deficits Awareness: Emergent Problem Solving: Slow processing, Requires verbal cues General Comments: Pt oriented today, fatigued and requires encouragement to get OOB.  Patient able to demonstrates immediate recall of 3 words, but requires min cueing to recall 2/3 after several minutes.  Patient able to count backwards from 20 but he tends to be able to only focus on one task at a time.  Discussed recommendations for dc, but pt unable to come up with plan for assist with meds and meals.        Exercises      Shoulder Instructions       General Comments pt declined out of room for higher level cog assessment due to  breakfast arriving    Pertinent Vitals/ Pain       Pain Assessment Pain Assessment: Faces Faces Pain Scale: Hurts little more Pain Location: scrotum Pain Descriptors / Indicators: Discomfort, Grimacing, Guarding Pain Intervention(s): Monitored during session, Repositioned  Home Living                                          Prior Functioning/Environment              Frequency  Min 1X/week        Progress Toward Goals  OT Goals(current goals can now be found in the care plan section)  Progress towards OT goals: Progressing toward goals  Acute Rehab OT Goals Patient Stated Goal: home OT Goal Formulation: With patient Time For Goal Achievement: 05/09/23 Potential to Achieve Goals: Good  Plan Discharge plan needs to be updated;Frequency remains appropriate    Co-evaluation                 AM-PAC OT "6 Clicks" Daily Activity     Outcome Measure   Help from another person eating meals?: None Help from another person taking care of personal grooming?: A Little Help from another person toileting, which includes using toliet, bedpan, or urinal?: A Little Help from another person bathing (including washing, rinsing, drying)?: A Little Help from another person to put on and taking off regular upper body clothing?: A Little Help  from another person to put on and taking off regular lower body clothing?: A Little 6 Click Score: 19    End of Session    OT Visit Diagnosis: Other symptoms and signs involving cognitive function;Other abnormalities of gait and mobility (R26.89)   Activity Tolerance Patient tolerated treatment well   Patient Left in chair;with call bell/phone within reach;with chair alarm set   Nurse Communication Mobility status        Time: 4098-1191 OT Time Calculation (min): 25 min  Charges: OT General Charges $OT Visit: 1 Visit OT Treatments $Self Care/Home Management : 23-37 mins  Barry Brunner, OT Acute Rehabilitation Services Office [phone]   Chancy Milroy 04/28/2023, 10:08 AM

## 2023-04-28 NOTE — Progress Notes (Signed)
No complaints. Waiting on meds.  Remains Stable for discharge.  Stephania Fragmin MD

## 2023-04-28 NOTE — Plan of Care (Addendum)
Patient discharged to self care/motel. RN demonstrated and educated on all insulin and blood sugar checks. All medication and AVS reviewed. RN transferred pt. To lobby via wheelchair and cab called.   Problem: Education: Goal: Knowledge of General Education information will improve Description: Including pain rating scale, medication(s)/side effects and non-pharmacologic comfort measures 04/28/2023 1706 by Asencion Gowda, RN Outcome: Adequate for Discharge 04/28/2023 1545 by Asencion Gowda, RN Outcome: Adequate for Discharge   Problem: Health Behavior/Discharge Planning: Goal: Ability to manage health-related needs will improve 04/28/2023 1706 by Asencion Gowda, RN Outcome: Adequate for Discharge 04/28/2023 1545 by Asencion Gowda, RN Outcome: Adequate for Discharge   Problem: Clinical Measurements: Goal: Ability to maintain clinical measurements within normal limits will improve 04/28/2023 1706 by Asencion Gowda, RN Outcome: Adequate for Discharge 04/28/2023 1545 by Asencion Gowda, RN Outcome: Adequate for Discharge Goal: Will remain free from infection 04/28/2023 1706 by Asencion Gowda, RN Outcome: Adequate for Discharge 04/28/2023 1545 by Asencion Gowda, RN Outcome: Adequate for Discharge Goal: Diagnostic test results will improve 04/28/2023 1706 by Asencion Gowda, RN Outcome: Adequate for Discharge 04/28/2023 1545 by Asencion Gowda, RN Outcome: Adequate for Discharge Goal: Respiratory complications will improve 04/28/2023 1706 by Asencion Gowda, RN Outcome: Adequate for Discharge 04/28/2023 1545 by Asencion Gowda, RN Outcome: Adequate for Discharge Goal: Cardiovascular complication will be avoided 04/28/2023 1706 by Asencion Gowda, RN Outcome: Adequate for Discharge 04/28/2023 1545 by Asencion Gowda, RN Outcome: Adequate for Discharge   Problem: Activity: Goal: Risk for activity intolerance will decrease 04/28/2023 1706 by  Asencion Gowda, RN Outcome: Adequate for Discharge 04/28/2023 1545 by Asencion Gowda, RN Outcome: Adequate for Discharge   Problem: Nutrition: Goal: Adequate nutrition will be maintained 04/28/2023 1706 by Asencion Gowda, RN Outcome: Adequate for Discharge 04/28/2023 1545 by Asencion Gowda, RN Outcome: Adequate for Discharge   Problem: Coping: Goal: Level of anxiety will decrease 04/28/2023 1706 by Asencion Gowda, RN Outcome: Adequate for Discharge 04/28/2023 1545 by Asencion Gowda, RN Outcome: Adequate for Discharge   Problem: Elimination: Goal: Will not experience complications related to bowel motility 04/28/2023 1706 by Asencion Gowda, RN Outcome: Adequate for Discharge 04/28/2023 1545 by Asencion Gowda, RN Outcome: Adequate for Discharge Goal: Will not experience complications related to urinary retention 04/28/2023 1706 by Asencion Gowda, RN Outcome: Adequate for Discharge 04/28/2023 1545 by Asencion Gowda, RN Outcome: Adequate for Discharge   Problem: Pain Managment: Goal: General experience of comfort will improve 04/28/2023 1706 by Asencion Gowda, RN Outcome: Adequate for Discharge 04/28/2023 1545 by Asencion Gowda, RN Outcome: Adequate for Discharge   Problem: Safety: Goal: Ability to remain free from injury will improve 04/28/2023 1706 by Asencion Gowda, RN Outcome: Adequate for Discharge 04/28/2023 1545 by Asencion Gowda, RN Outcome: Adequate for Discharge   Problem: Skin Integrity: Goal: Risk for impaired skin integrity will decrease 04/28/2023 1706 by Asencion Gowda, RN Outcome: Adequate for Discharge 04/28/2023 1545 by Asencion Gowda, RN Outcome: Adequate for Discharge

## 2023-04-28 NOTE — Plan of Care (Signed)

## 2023-04-28 NOTE — TOC Transition Note (Signed)
Transition of Care [facility]) - CM/SW Discharge Note   Patient Details  Name: Justin Snow MRN: [id number] Date of Birth: Nov 29, 1955  [facility] CM/SW Contact:  Kermit Balo, RN Phone Number: 04/28/2023, 3:43 PM   Clinical Narrative:    Patient is discharging back to his motel. He was staying at [facility] on [address]. CM has provided cab voucher for his return.  Pt without insurance. He needs to go to [employer] to get his medicare/ medicaid turned back on and get his check restarted. He is aware. CM provided him bus passes to get to [employer].  Medications covered under [employer] as he currently doesn't have insurance.  CM has attempted to  update his ex-spouse without success.    Final next level of care: Home/Self Care Barriers to Discharge: Inadequate or no insurance   Patient Goals and CMS Choice CMS Medicare.gov Compare Post Acute Care list provided to:: Patient Choice offered to / list presented to : Patient  Discharge Placement                         Discharge Plan and Services Additional resources added to the After Visit Summary for   In-house Referral: Clinical Social Work   Post Acute Care Choice: Skilled Nursing Facility                               Social Determinants of Health (SDOH) Interventions SDOH Screenings   Food Insecurity: Food Insecurity Present (04/28/2023)  Housing: Patient Declined (04/28/2023)  Transportation Needs: Unmet Transportation Needs (04/28/2023)  Utilities: At Risk (04/28/2023)  Tobacco Use: High Risk (04/24/2023)     Readmission Risk Interventions     No data to display

## 2023-05-02 ENCOUNTER — Encounter (HOSPITAL_COMMUNITY): Payer: Self-pay

## 2023-05-02 ENCOUNTER — Emergency Department (HOSPITAL_COMMUNITY)
Admission: EM | Admit: 2023-05-02 | Discharge: 2023-05-02 | Discharge status: Against Medical Advice | Payer: Medicaid Other | Attending: Student | Admitting: Student

## 2023-05-02 ENCOUNTER — Other Ambulatory Visit: Payer: Self-pay

## 2023-05-02 ENCOUNTER — Emergency Department (HOSPITAL_COMMUNITY): Payer: Medicaid Other

## 2023-05-02 DIAGNOSIS — R531 Weakness: Secondary | ICD-10-CM | POA: Diagnosis present

## 2023-05-02 DIAGNOSIS — Z79899 Other long term (current) drug therapy: Secondary | ICD-10-CM | POA: Diagnosis not present

## 2023-05-02 DIAGNOSIS — I10 Essential (primary) hypertension: Secondary | ICD-10-CM | POA: Diagnosis not present

## 2023-05-02 DIAGNOSIS — E119 Type 2 diabetes mellitus without complications: Secondary | ICD-10-CM | POA: Insufficient documentation

## 2023-05-02 DIAGNOSIS — Z794 Long term (current) use of insulin: Secondary | ICD-10-CM | POA: Diagnosis not present

## 2023-05-02 DIAGNOSIS — R42 Dizziness and giddiness: Secondary | ICD-10-CM | POA: Insufficient documentation

## 2023-05-02 DIAGNOSIS — Z5329 Procedure and treatment not carried out because of patient's decision for other reasons: Secondary | ICD-10-CM | POA: Insufficient documentation

## 2023-05-02 DIAGNOSIS — Z1152 Encounter for screening for COVID-19: Secondary | ICD-10-CM | POA: Insufficient documentation

## 2023-05-02 DIAGNOSIS — J45909 Unspecified asthma, uncomplicated: Secondary | ICD-10-CM | POA: Diagnosis not present

## 2023-05-02 DIAGNOSIS — R030 Elevated blood-pressure reading, without diagnosis of hypertension: Secondary | ICD-10-CM

## 2023-05-02 HISTORY — DX: Essential (primary) hypertension: I10

## 2023-05-02 LAB — COMPREHENSIVE METABOLIC PANEL
ALT: 30 U/L (ref 0–44)
AST: 17 U/L (ref 15–41)
Albumin: 3.2 g/dL — ABNORMAL LOW (ref 3.5–5.0)
Alkaline Phosphatase: 131 U/L — ABNORMAL HIGH (ref 38–126)
Anion gap: 9 (ref 5–15)
BUN: 14 mg/dL (ref 8–23)
CO2: 26 mmol/L (ref 22–32)
Calcium: 8.8 mg/dL — ABNORMAL LOW (ref 8.9–10.3)
Chloride: 101 mmol/L (ref 98–111)
Creatinine, Ser: 1.42 mg/dL — ABNORMAL HIGH (ref 0.61–1.24)
GFR, Estimated: 54 mL/min — ABNORMAL LOW (ref >60)
Glucose, Bld: 313 mg/dL — ABNORMAL HIGH (ref 70–99)
Potassium: 3.7 mmol/L (ref 3.5–5.1)
Sodium: 136 mmol/L (ref 135–145)
Total Bilirubin: 0.4 mg/dL (ref 0.3–1.2)
Total Protein: 6.2 g/dL — ABNORMAL LOW (ref 6.5–8.1)

## 2023-05-02 LAB — URINALYSIS, ROUTINE W REFLEX MICROSCOPIC
Bacteria, UA: NONE SEEN
Bilirubin Urine: NEGATIVE
Glucose, UA: 150 mg/dL — AB
Hgb urine dipstick: NEGATIVE
Ketones, ur: NEGATIVE mg/dL
Leukocytes,Ua: NEGATIVE
Nitrite: NEGATIVE
Protein, ur: >=300 mg/dL — AB
Specific Gravity, Urine: 1.005 (ref 1.005–1.030)
pH: 7 (ref 5.0–8.0)

## 2023-05-02 LAB — CBC
HCT: 36.5 % — ABNORMAL LOW (ref 39.0–52.0)
Hemoglobin: 12 g/dL — ABNORMAL LOW (ref 13.0–17.0)
MCH: 28.3 pg (ref 26.0–34.0)
MCHC: 32.9 g/dL (ref 30.0–36.0)
MCV: 86.1 fL (ref 80.0–100.0)
Platelets: 195 10*3/uL (ref 150–400)
RBC: 4.24 MIL/uL (ref 4.22–5.81)
RDW: 14.1 % (ref 11.5–15.5)
WBC: 9.7 10*3/uL (ref 4.0–10.5)
nRBC: 0 % (ref 0.0–0.2)

## 2023-05-02 LAB — ETHANOL: Alcohol, Ethyl (B): <10 mg/dL (ref <10)

## 2023-05-02 LAB — LIPASE, BLOOD: Lipase: 28 U/L (ref 11–51)

## 2023-05-02 LAB — SARS CORONAVIRUS 2 BY RT PCR: SARS Coronavirus 2 by RT PCR: NEGATIVE

## 2023-05-02 LAB — TROPONIN I (HIGH SENSITIVITY)
Troponin I (High Sensitivity): 12 ng/L (ref <18)
Troponin I (High Sensitivity): 13 ng/L (ref <18)

## 2023-05-02 LAB — CBG MONITORING, ED: Glucose-Capillary: 305 mg/dL — ABNORMAL HIGH (ref 70–99)

## 2023-05-02 MED ORDER — AMLODIPINE BESYLATE 5 MG PO TABS
10.0000 mg | ORAL_TABLET | Freq: Once | ORAL | Status: AC
  Administered 2023-05-02: 10 mg via ORAL
  Filled 2023-05-02: qty 2

## 2023-05-02 MED ORDER — NICOTINE 14 MG/24HR TD PT24
14.0000 mg | MEDICATED_PATCH | Freq: Once | TRANSDERMAL | Status: DC
  Administered 2023-05-02: 14 mg via TRANSDERMAL
  Filled 2023-05-02: qty 1

## 2023-05-02 MED ORDER — BENZONATATE 100 MG PO CAPS
100.0000 mg | ORAL_CAPSULE | Freq: Once | ORAL | Status: AC
  Administered 2023-05-02: 100 mg via ORAL
  Filled 2023-05-02: qty 1

## 2023-05-02 MED ORDER — LISINOPRIL 20 MG PO TABS
40.0000 mg | ORAL_TABLET | Freq: Once | ORAL | Status: AC
  Administered 2023-05-02: 40 mg via ORAL
  Filled 2023-05-02: qty 2

## 2023-05-02 MED ORDER — LORAZEPAM 2 MG/ML IJ SOLN
1.0000 mg | Freq: Once | INTRAMUSCULAR | Status: DC
  Filled 2023-05-02: qty 1

## 2023-05-02 MED ORDER — CARVEDILOL 3.125 MG PO TABS
6.2500 mg | ORAL_TABLET | Freq: Two times a day (BID) | ORAL | Status: DC
  Filled 2023-05-02: qty 2

## 2023-05-02 MED ORDER — SODIUM CHLORIDE 0.9 % IV BOLUS
1000.0000 mL | Freq: Once | INTRAVENOUS | Status: AC
  Administered 2023-05-02: 1000 mL via INTRAVENOUS

## 2023-05-02 MED ORDER — HYDROCHLOROTHIAZIDE 12.5 MG PO TABS
12.5000 mg | ORAL_TABLET | Freq: Every day | ORAL | Status: DC
  Administered 2023-05-02: 12.5 mg via ORAL
  Filled 2023-05-02: qty 1

## 2023-05-02 NOTE — ED Notes (Signed)
Went over to MRI after reports of pt being claustrophobic. When I arrived pt states, " I don't want to do the damn test." Pt stating he wants to go. Pt brought back to unit. EDP notified. Pt choosing to leave AMA. Pt advised to return if he changes his mind or if his symptoms worsen. Pt also encouraged to follow up with PCP.

## 2023-05-02 NOTE — ED Notes (Signed)
Pt unhooking himself from the monitor repeatedly and keeps trying to leave to go smoke. Pt redirected back to room each time. Nicotine patch ordered and placed. Brittini, NP talked to pt and discussed that if he leaves it would be AMA. Pt agreeable each time he is redirected back to room, but appears to forget plan. MRI arrived and pt to MRI at this time.

## 2023-05-02 NOTE — ED Provider Notes (Signed)
Care assumed from previous provider.  See note for HPI.  In summation 67 year old recently admitted for intracerebral hemorrhage especially hypertensive emergency recently discharged here for evaluation of generalized weakness.  He has not been taking medications prescribed him at discharge.  He reportedly lives in a hotel.  Has a nonfocal neuroexam per previous provider.  He is getting his home meds for his blood pressure.  CT head and labs do not show significant findings, plan on follow-up on MRI Physical Exam  BP (!) 161/73   Pulse 83   Temp 98.1 F (36.7 C)   Resp 19   Ht 5\' 6"  (1.676 m)   Wt 99.8 kg   SpO2 95%   BMI 35.51 kg/m   Physical Exam Vitals and nursing note reviewed.  Constitutional:      General: He is not in acute distress.    Appearance: He is well-developed. He is not ill-appearing or diaphoretic.     Comments: Disheveled appearance  HENT:     Head: Atraumatic.  Eyes:     Pupils: Pupils are equal, round, and reactive to light.  Cardiovascular:     Rate and Rhythm: Normal rate and regular rhythm.  Pulmonary:     Effort: Pulmonary effort is normal. No respiratory distress.  Abdominal:     General: There is no distension.     Palpations: Abdomen is soft.  Musculoskeletal:        General: Normal range of motion.     Cervical back: Normal range of motion and neck supple.  Skin:    General: Skin is warm and dry.  Neurological:     Mental Status: He is alert.     Comments: Ambulatory without ataxic gait Follows commands No obvious facial droop Moves all 4 extremities equally bilaterally     Procedures  Procedures Labs Reviewed  CBC - Abnormal; Notable for the following components:      Result Value   Hemoglobin 12.0 (*)    HCT 36.5 (*)    All other components within normal limits  URINALYSIS, ROUTINE W REFLEX MICROSCOPIC - Abnormal; Notable for the following components:   Color, Urine STRAW (*)    Glucose, UA 150 (*)    Protein, ur >=300 (*)     All other components within normal limits  COMPREHENSIVE METABOLIC PANEL - Abnormal; Notable for the following components:   Glucose, Bld 313 (*)    Creatinine, Ser 1.42 (*)    Calcium 8.8 (*)    Total Protein 6.2 (*)    Albumin 3.2 (*)    Alkaline Phosphatase 131 (*)    GFR, Estimated 54 (*)    All other components within normal limits  CBG MONITORING, ED - Abnormal; Notable for the following components:   Glucose-Capillary 305 (*)    All other components within normal limits  SARS CORONAVIRUS 2 BY RT PCR  LIPASE, BLOOD  ETHANOL  TROPONIN I (HIGH SENSITIVITY)  TROPONIN I (HIGH SENSITIVITY)   DG Chest Portable 1 View  Result Date: 05/02/2023 CLINICAL DATA:  Shortness of breath EXAM: PORTABLE CHEST 1 VIEW COMPARISON:  04/24/2023 FINDINGS: The heart size and mediastinal contours are within normal limits. Coarsened bibasilar interstitial markings. No pleural effusion or pneumothorax. The visualized skeletal structures are unremarkable. IMPRESSION: Coarsened bibasilar interstitial markings, which may reflect bronchitic lung changes. Electronically Signed   By: Duanne Guess D.O.   On: 05/02/2023 12:50   CT Head Wo Contrast  Result Date: 05/02/2023 CLINICAL DATA:  Mental status change,  unknown cause EXAM: CT HEAD WITHOUT CONTRAST TECHNIQUE: Contiguous axial images were obtained from the base of the skull through the vertex without intravenous contrast. RADIATION DOSE REDUCTION: This exam was performed according to the departmental dose-optimization program which includes automated exposure control, adjustment of the mA and/or kV according to patient size and/or use of iterative reconstruction technique. COMPARISON:  Brain MRI 04/24/2023 FINDINGS: Brain: No evidence of acute infarction, hemorrhage, hydrocephalus, extra-axial collection or mass lesion/mass effect. Sequela of moderate chronic microvascular ischemic change. Chronic left thalamic infarct. Vascular: No hyperdense vessel or unexpected  calcification. Skull: Normal. Negative for fracture or focal lesion. Sinuses/Orbits: No middle ear or mastoid effusion. Paranasal sinuses are clear. Orbits are unremarkable. Other: None. IMPRESSION: No acute intracranial abnormality. Electronically Signed   By: Lorenza Cambridge M.D.   On: 05/02/2023 12:27   ECHOCARDIOGRAM COMPLETE  Result Date: 04/24/2023    ECHOCARDIOGRAM REPORT   Patient Name:   Justin Snow Date of Exam: 04/24/2023 Medical Rec #:  [id number]          Height:       66.0 in Accession #:    [id number]         Weight:       206.8 lb Date of Birth:  [birth year]          BSA:          2.028 m Patient Age:    66 years           BP:           156/75 mmHg Patient Gender: M                  HR:           68 bpm. Exam Location:  Inpatient Procedure: 2D Echo, Cardiac Doppler and Color Doppler Indications:     Stroke I63.9  History:         Patient has no prior history of Echocardiogram examinations.                  Signs/Symptoms:Edema; Risk Factors:Hypertension, Current                  Smoker, Diabetes and Dyslipidemia.  Sonographer:     Aron Baba Referring Phys:  [id number] Gordy Councilman Diagnosing Phys: Yates Decamp MD  Sonographer Comments: Technically difficult study due to poor echo windows. Image acquisition challenging due to respiratory motion, Image acquisition challenging due to uncooperative patient and Image acquisition challenging due to patient body habitus. IMPRESSIONS  1. Poor echo window. Left ventricular ejection fraction, by estimation, is 60 to 65%. The left ventricle has normal function. The left ventricle has no regional wall motion abnormalities. Left ventricular diastolic parameters are consistent with Grade II diastolic dysfunction (pseudonormalization). Elevated left ventricular end-diastolic pressure.  2. Right ventricular systolic function is normal. The right ventricular size is normal. There is normal pulmonary artery systolic pressure.  3. Left atrial size was mildly  dilated.  4. The mitral valve is normal in structure. Trivial mitral valve regurgitation. No evidence of mitral stenosis.  5. The aortic valve was not well visualized. There is mild calcification of the aortic valve. Aortic valve regurgitation is not visualized. Mild aortic valve stenosis. Aortic valve area, by VTI measures 1.58 cm. Aortic valve mean gradient measures 12.0  mmHg. Aortic valve Vmax measures 2.36 m/s.  6. The inferior vena cava is normal in size with greater than 50% respiratory variability, suggesting right atrial pressure  of 3 mmHg. Conclusion(s)/Recommendation(s): Consider TEE if clinically indicated. No obvious source of cerebral embolism, poor echo window. FINDINGS  Left Ventricle: Poor echo window. Left ventricular ejection fraction, by estimation, is 60 to 65%. The left ventricle has normal function. The left ventricle has no regional wall motion abnormalities. The left ventricular internal cavity size was normal  in size. There is no left ventricular hypertrophy. Left ventricular diastolic parameters are consistent with Grade II diastolic dysfunction (pseudonormalization). Elevated left ventricular end-diastolic pressure. Right Ventricle: The right ventricular size is normal. No increase in right ventricular wall thickness. Right ventricular systolic function is normal. There is normal pulmonary artery systolic pressure. The tricuspid regurgitant velocity is 0.68 m/s, and  with an assumed right atrial pressure of 3 mmHg, the estimated right ventricular systolic pressure is 4.8 mmHg. Left Atrium: Left atrial size was mildly dilated. Right Atrium: Right atrial size was normal in size. Pericardium: There is no evidence of pericardial effusion. Mitral Valve: The mitral valve is normal in structure. Trivial mitral valve regurgitation. No evidence of mitral valve stenosis. Tricuspid Valve: The tricuspid valve is normal in structure. Tricuspid valve regurgitation is trivial. No evidence of tricuspid  stenosis. Aortic Valve: The aortic valve was not well visualized. There is mild calcification of the aortic valve. Aortic valve regurgitation is not visualized. Mild aortic stenosis is present. Aortic valve mean gradient measures 12.0 mmHg. Aortic valve peak gradient measures 22.3 mmHg. Aortic valve area, by VTI measures 1.58 cm. Pulmonic Valve: The pulmonic valve was not well visualized. Pulmonic valve regurgitation is not visualized. Aorta: The aortic root and ascending aorta are structurally normal, with no evidence of dilitation. Venous: The inferior vena cava is normal in size with greater than 50% respiratory variability, suggesting right atrial pressure of 3 mmHg. IAS/Shunts: The interatrial septum was not assessed.  LEFT VENTRICLE PLAX 2D LVIDd:         3.70 cm   Diastology LVIDs:         2.60 cm   LV e' medial:    5.44 cm/s LV PW:         0.90 cm   LV E/e' medial:  14.4 LV IVS:        0.90 cm   LV e' lateral:   7.51 cm/s LVOT diam:     2.10 cm   LV E/e' lateral: 10.4 LV SV:         77 LV SV Index:   38 LVOT Area:     3.46 cm  RIGHT VENTRICLE RV S prime:     18.60 cm/s TAPSE (M-mode): 1.7 cm LEFT ATRIUM             Index        RIGHT ATRIUM           Index LA diam:        3.50 cm 1.73 cm/m   RA Area:     15.20 cm LA Vol (A2C):   51.5 ml 25.39 ml/m  RA Volume:   36.40 ml  17.95 ml/m LA Vol (A4C):   33.5 ml 16.52 ml/m LA Biplane Vol: 45.6 ml 22.48 ml/m  AORTIC VALVE AV Area (Vmax):    1.69 cm AV Area (Vmean):   1.65 cm AV Area (VTI):     1.58 cm AV Vmax:           236.00 cm/s AV Vmean:          156.000 cm/s AV VTI:  0.486 m AV Peak Grad:      22.3 mmHg AV Mean Grad:      12.0 mmHg LVOT Vmax:         115.00 cm/s LVOT Vmean:        74.400 cm/s LVOT VTI:          0.221 m LVOT/AV VTI ratio: 0.45  AORTA Ao Root diam: 3.70 cm Ao Asc diam:  3.50 cm MITRAL VALVE                TRICUSPID VALVE MV Area (PHT): 2.05 cm     TR Peak grad:   1.8 mmHg MV Decel Time: 370 msec     TR Vmax:        67.80  cm/s MR Peak grad: 4.1 mmHg MR Vmax:      101.00 cm/s   SHUNTS MV E velocity: 78.10 cm/s   Systemic VTI:  0.22 m MV A velocity: 101.00 cm/s  Systemic Diam: 2.10 cm MV E/A ratio:  0.77 Yates Decamp MD Electronically signed by Yates Decamp MD Signature Date/Time: 04/24/2023/11:00:11 PM    Final    MR BRAIN WO CONTRAST  Result Date: 04/24/2023 CLINICAL DATA:  67 year old male code stroke presentation. Slurred speech. Subcentimeter hypodensity in the left thalamus, hemorrhage versus cavernous malformation. EXAM: MRI HEAD WITHOUT CONTRAST TECHNIQUE: Multiplanar, multiecho pulse sequences of the brain and surrounding structures were obtained without intravenous contrast. COMPARISON:  CTA head and neck and head CT 0312 hours today. FINDINGS: The examination had to be discontinued prior to completion, the patient was trying to climb out of the scanner. Mildly degraded axial and coronal DWI, axial T2 and sagittal T1 weighted imaging was obtained. Major intracranial vascular flow voids are preserved. No midline shift. No ventriculomegaly. Cervicomedullary junction and pituitary are within normal limits. Diffusion-weighted imaging is highly heterogeneous at the left thalamus which appears in part due to extensive susceptibility from blood products. And there is a small 4 mm focus of T2 hypointensity corresponding to the hyperdense CT lesion on series 5, image 11. No significant mass effect or edema. 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 Occasional brainstem chronic lacunar infarcts. DWI is positive for a 4-5 mm right corona radiata or pre motor subcortical white matter lacunar infarct on series 2, image 36. No evidence of hemorrhage or mass effect there. No other restricted diffusion. IMPRESSION: 1. Truncated and mildly motion degraded noncontrast MRI. 2. Small acute Left thalamic hemorrhage (4 mm, no edema or mass effect)  superimposed on evidence of extensive chronic microhemorrhages in the deep gray nuclei. 3. And positive also for a small acute white matter lacunar infarct in the right centrum semiovale. No associated hemorrhage or mass effect. 4. Underlying advanced chronic small-vessel disease. Salient findings were communicated to Dr. Iver Nestle At 6:46 am on 04/24/2023 by text page via the [facility] messaging system. Electronically Signed   By: Odessa Fleming M.D.   On: 04/24/2023 06:46   DG Chest Port 1 View  Result Date: 04/24/2023 CLINICAL DATA:  Altered mental status with slurred speech and left-sided facial droop. EXAM: PORTABLE CHEST 1 VIEW COMPARISON:  October 01, 2022 FINDINGS: The heart size and mediastinal contours are within normal limits. Mild atelectasis is seen within the bilateral lung bases. No pleural effusions no pneumothorax is identified. Multilevel degenerative changes are seen within the lower thoracic spine. IMPRESSION: Mild bibasilar atelectasis. Electronically Signed   By: Aram Candela  M.D.   On: 04/24/2023 04:29   CT ANGIO HEAD NECK W WO CM (CODE STROKE)  Result Date: 04/24/2023 CLINICAL DATA:  Slurred speech EXAM: CT ANGIOGRAPHY HEAD AND NECK WITH AND WITHOUT CONTRAST TECHNIQUE: Multidetector CT imaging of the head and neck was performed using the standard protocol during bolus administration of intravenous contrast. Multiplanar CT image reconstructions and MIPs were obtained to evaluate the vascular anatomy. Carotid stenosis measurements (when applicable) are obtained utilizing NASCET criteria, using the distal internal carotid diameter as the denominator. RADIATION DOSE REDUCTION: This exam was performed according to the departmental dose-optimization program which includes automated exposure control, adjustment of the mA and/or kV according to patient size and/or use of iterative reconstruction technique. CONTRAST:  75mL OMNIPAQUE IOHEXOL 350 MG/ML SOLN COMPARISON:  None Available. FINDINGS: CTA NECK  FINDINGS SKELETON: There is no bony spinal canal stenosis. No lytic or blastic lesion. OTHER NECK: Normal pharynx, larynx and major salivary glands. No cervical lymphadenopathy. Unremarkable thyroid gland. UPPER CHEST: Biapical emphysema AORTIC ARCH: There is calcific atherosclerosis of the aortic arch. Conventional 3 vessel aortic branching pattern. RIGHT CAROTID SYSTEM: No dissection, occlusion or aneurysm. Mild atherosclerotic calcification at the carotid bifurcation without hemodynamically significant stenosis. LEFT CAROTID SYSTEM: No dissection, occlusion or aneurysm. Mild atherosclerotic calcification at the carotid bifurcation without hemodynamically significant stenosis. VERTEBRAL ARTERIES: Left dominant configuration.There is no dissection, occlusion or flow-limiting stenosis to the skull base (V1-V3 segments). CTA HEAD FINDINGS POSTERIOR CIRCULATION: --Vertebral arteries: Normal V4 segments. --Inferior cerebellar arteries: Normal. --Basilar artery: Normal. --Superior cerebellar arteries: Normal. --Posterior cerebral arteries (PCA): Severe stenosis at the right P1 P2 junction. Fetal origin of the left PCA. ANTERIOR CIRCULATION: --Intracranial internal carotid arteries: Atherosclerotic calcification of the internal carotid arteries at the skull base without hemodynamically significant stenosis. --Anterior cerebral arteries (ACA): Normal. Both A1 segments are present. Patent anterior communicating artery (a-comm). 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 VENOUS SINUSES: As permitted by contrast timing, patent. ANATOMIC VARIANTS: Fetal origin of the left posterior cerebral artery. Review of the MIP images confirms the above findings. IMPRESSION: 1. No emergent large vessel occlusion. 2. 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 3. Severe stenosis at the right PCA P1-P2 junction. 4. Mild bilateral carotid bifurcation  atherosclerosis without hemodynamically significant stenosis. Aortic Atherosclerosis (ICD10-I70.0). Electronically Signed   By: Deatra Robinson M.D.   On: 04/24/2023 03:46   CT HEAD CODE STROKE WO CONTRAST  Result Date: 04/24/2023 CLINICAL DATA:  Code stroke.  Altered mental status EXAM: CT HEAD WITHOUT CONTRAST TECHNIQUE: Contiguous axial images were obtained from the base of the skull through the vertex without intravenous contrast. RADIATION DOSE REDUCTION: This exam was performed according to the departmental dose-optimization program which includes automated exposure control, adjustment of the mA and/or kV according to patient size and/or use of iterative reconstruction technique. COMPARISON:  None Available. FINDINGS: Brain: 5 mm hyperdense focus of the left thalamus without surrounding edema. The size and configuration of the ventricles and extra-axial CSF spaces are normal. There is hypoattenuation of the periventricular white matter, most commonly indicating chronic ischemic microangiopathy. 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 Vascular: No abnormal hyperdensity of the major intracranial arteries or dural venous sinuses. No intracranial atherosclerosis. Skull: The visualized skull base, calvarium and extracranial soft tissues are normal. Sinuses/Orbits: No fluid levels or advanced mucosal thickening of the visualized paranasal sinuses. No mastoid or middle ear effusion. The orbits are  normal. ASPECTS (Alberta Stroke Program Early CT Score) - Ganglionic level infarction (caudate, lentiform nuclei, internal capsule, insula, M1-M3 cortex): 7 - Supraganglionic infarction (M4-M6 cortex): 3 Total score (0-10 with 10 being normal): 10 IMPRESSION: 1. A 5 mm hyperdense focus of the left thalamus without surrounding edema. 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 2. ASPECTS is 10. 3. 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 These results were communicated to Dr. Brooke Dare at 3:17  am on 04/24/2023 by text page via the [facility] messaging system. Electronically Signed   By: Deatra Robinson M.D.   On: 04/24/2023 03:18    ED Course / MDM   Clinical Course as of 05/02/23 1902  Mon May 02, 2023  1130 Discussed with CT to move patient to top of list given recent ICH. CT will take patient next. No neurological deficits on exam.  [CA]  1217 Glucose-Capillary(!): 305 [CA]  1217 Protein(!): >=300 [CA]  1440 Patient continuously coughing. Added COVID test.  [CA]  1506 MR brain, if normal can dc home and FU with Neuro. Not compliant with meds. Probs HH? [BH]  1714 BP down to 161 systolic [BH]    Clinical Course User Index [BH] ,  A, PA-C [CA] Mannie Stabile, PA-C   Care assumed from previous provider.  See note for HPI.  In summation 67 year old recently admitted for intracerebral hemorrhage especially hypertensive emergency recently discharged here for evaluation of generalized weakness.  He has not been taking medications prescribed him at discharge.  He reportedly lives in a hotel.  Has a nonfocal neuroexam per previous provider.  He is getting his home meds for his blood pressure.  CT head and labs do not show significant findings, plan on follow-up on MRI  Labs and imaging personally viewed and interpreted  Patient attempted to elope earlier to make a cigarette outside.  I discussed risk versus benefit of leaving the hospital.  I discussed leaving AGAINST MEDICAL ADVICE.  Patient given nicotine patch.  Agreeable to stay for further imaging at this time.  Of note, he has pulled off his EKG leads and is refusing to keep these on.  He is ambulatory without ataxic gait at this time.  Nursing has informed me that patient eloped from the emergency department and stated he did not want any further workup or imaging done.  I attempted to see patient again however he had left the emergency department by the time I arrived at his room.    Medical Decision Making Amount  and/or Complexity of Data Reviewed External Data Reviewed: labs, radiology, ECG and notes. Labs: ordered. Decision-making details documented in ED Course. Radiology: ordered and independent interpretation performed. Decision-making details documented in ED Course. ECG/medicine tests: ordered and independent interpretation performed. Decision-making details documented in ED Course.  Risk OTC drugs. Prescription drug management. Decision regarding hospitalization. Diagnosis or treatment significantly limited by social determinants of health.          ,  A, PA-C 05/02/23 1903    Rexford Maus, DO 05/02/23 2352

## 2023-05-02 NOTE — ED Provider Notes (Cosign Needed)
[place] EMERGENCY DEPARTMENT AT [facility] Provider Note   CSN: [id number] Arrival date & time: 05/02/23  1103     History  Chief Complaint  Patient presents with   Weakness    Justin Snow is a 67 y.o. male with a past medical history significant for diabetes, asthma, hypertension, recent ICH (discharged on 7/31) who presents to the ED due to generalized weakness.  Patient notes he feels "worn out".  Denies any unilateral weakness.  No visual or speech changes.  Per triage note patient stated he felt dizzy to EMS.  He notes he feels off balance which started last night. Very difficult to obtain full HPI from patient.  Patient unable to describe symptoms.  Patient was recently admitted for ICH thought to be secondary to elevated blood pressure.  No recent alcohol or drug use.  Patient has not been taking his medications because he is unable to read the instructions. Admits to some shortness of breath. No chest pain. Denies abdominal, nausea, vomiting, and diarrhea.   History obtained from patient and past medical records. No interpreter used during encounter.       Home Medications Prior to Admission medications   Medication Sig Start Date End Date Taking? Authorizing Provider  albuterol (VENTOLIN HFA) 108 (90 Base) MCG/ACT inhaler Inhale into the lungs every 6 (six) hours as needed for wheezing or shortness of breath.    [provider]  amLODipine (NORVASC) 10 MG tablet Take 1 tablet (10 mg total) by mouth daily. 04/27/23   Amin, Loura Halt, MD  atorvastatin (LIPITOR) 10 MG tablet Take 1 tablet (10 mg total) by mouth at bedtime. 04/27/23   Amin, Loura Halt, MD  Blood Glucose Monitoring Suppl (BLOOD GLUCOSE MONITOR SYSTEM) w/Device KIT Use 3 (three) times daily. 04/27/23   Amin, Loura Halt, MD  carvedilol (COREG) 6.25 MG tablet Take 1 tablet (6.25 mg total) by mouth 2 (two) times daily. 04/27/23   Amin, Loura Halt, MD  DULoxetine (CYMBALTA) 30 MG capsule  Take 1 capsule (30 mg total) by mouth 2 (two) times daily. 04/27/23   Amin, Loura Halt, MD  fluticasone-salmeterol (ADVAIR HFA) 086-57 MCG/ACT inhaler Inhale 2 puffs into the lungs 2 (two) times daily.    [provider]  gabapentin (NEURONTIN) 400 MG capsule Take 400 mg by mouth 3 (three) times daily. 05/25/20   [provider]  Glucagon (GVOKE HYPOPEN 2-PACK) 1 MG/0.2ML SOAJ Inject 1 mg into the skin as needed for up to 2 doses (Severe low blood sugar). 04/27/23   Amin, Loura Halt, MD  Glucose Blood (BLOOD GLUCOSE TEST STRIPS) STRP Use as directed to check blood sugar three times daily 04/27/23   Amin, Loura Halt, MD  hydrochlorothiazide (HYDRODIURIL) 12.5 MG tablet Take 1 tablet (12.5 mg total) by mouth daily. 04/28/23   Amin, Loura Halt, MD  insulin aspart (NOVOLOG) 100 UNIT/ML FlexPen Inject 0-6 Units into the skin 3 (three) times daily with meals. Check Blood Glucose (BG) and inject per scale: BG <150= 0 unit; BG 150-200= 1 unit; BG 201-250= 2 unit; BG 251-300= 3 unit; BG 301-350= 4 unit; BG 351-400= 5 unit; BG >400= 6 unit and Call Primary Care. 04/27/23   Amin, Ankit Chirag, MD  insulin glargine-yfgn (SEMGLEE, YFGN,) 100 UNIT/ML Pen Inject 15 Units into the skin daily. 04/27/23   Amin, Loura Halt, MD  Insulin Pen Needle 31G X 8 MM MISC Use 3 (three) times daily. 04/27/23   Dimple Nanas, MD  Insulin  Syringe-Needle U-100 (INSULIN SYRINGE .5CC/30GX1/2") 30G X 1/2" 0.5 ML MISC Use as directed with insulin. 10/18/19   Mardella Layman, MD  Lancet Device MISC Use 3 (three) times daily. May dispense any manufacturer covered by patient's insurance 04/27/23   Amin, Loura Halt, MD  lisinopril (ZESTRIL) 40 MG tablet Take 1 tablet (40 mg total) by mouth daily. 04/27/23   Amin, Loura Halt, MD  metFORMIN (GLUCOPHAGE) 1000 MG tablet Take 1 tablet (1,000 mg total) by mouth 2 (two) times daily. 04/27/23 07/26/23  Amin, Loura Halt, MD  omeprazole (PRILOSEC) 20 MG capsule Take 1 capsule (20  mg total) by mouth every morning. 04/27/23   Amin, Loura Halt, MD  potassium chloride (MICRO-K) 10 MEQ CR capsule Take 10 mEq by mouth daily. 05/12/20   [provider]  TRUEplus Lancets 28G MISC Use 3 (three) times daily as directed to check blood sugar 04/27/23   Amin, Loura Halt, MD      Allergies    Patient has no known allergies.    Review of Systems   Review of Systems  Constitutional:  Positive for fatigue.  Respiratory:  Positive for shortness of breath.   Cardiovascular:  Negative for chest pain.  Gastrointestinal:  Negative for abdominal pain, diarrhea, nausea and vomiting.  Neurological:  Positive for weakness.    Physical Exam Updated Vital Signs BP (!) 197/94   Pulse 83   Temp 98.1 F (36.7 C)   Resp 14   Ht 5\' 6"  (1.676 m)   Wt 99.8 kg   SpO2 100%   BMI 35.51 kg/m  Physical Exam Vitals and nursing note reviewed.  Constitutional:      General: He is not in acute distress.    Appearance: He is not ill-appearing.  HENT:     Head: Normocephalic.  Eyes:     Pupils: Pupils are equal, round, and reactive to light.  Cardiovascular:     Rate and Rhythm: Normal rate and regular rhythm.     Pulses: Normal pulses.     Heart sounds: Normal heart sounds. No murmur heard.    No friction rub. No gallop.  Pulmonary:     Effort: Pulmonary effort is normal.     Breath sounds: Normal breath sounds.  Abdominal:     General: Abdomen is flat. There is no distension.     Palpations: Abdomen is soft.     Tenderness: There is no abdominal tenderness. There is no guarding or rebound.  Musculoskeletal:        General: Normal range of motion.     Cervical back: Neck supple.  Skin:    General: Skin is warm and dry.  Neurological:     General: No focal deficit present.     Mental Status: He is alert.     Comments: Speech is clear, able to follow commands CN III-XII intact Normal strength in upper and lower extremities bilaterally including dorsiflexion and plantar  flexion, strong and equal grip strength Sensation grossly intact throughout Moves extremities without ataxia, coordination intact No pronator drift  Psychiatric:        Mood and Affect: Mood normal.        Behavior: Behavior normal.     ED Results / Procedures / Treatments   Labs (all labs ordered are listed, but only abnormal results are displayed) Labs Reviewed  CBC - Abnormal; Notable for the following components:      Result Value   Hemoglobin 12.0 (*)    HCT 36.5 (*)  All other components within normal limits  URINALYSIS, ROUTINE W REFLEX MICROSCOPIC - Abnormal; Notable for the following components:   Color, Urine STRAW (*)    Glucose, UA 150 (*)    Protein, ur >=300 (*)    All other components within normal limits  COMPREHENSIVE METABOLIC PANEL - Abnormal; Notable for the following components:   Glucose, Bld 313 (*)    Creatinine, Ser 1.42 (*)    Calcium 8.8 (*)    Total Protein 6.2 (*)    Albumin 3.2 (*)    Alkaline Phosphatase 131 (*)    GFR, Estimated 54 (*)    All other components within normal limits  CBG MONITORING, ED - Abnormal; Notable for the following components:   Glucose-Capillary 305 (*)    All other components within normal limits  SARS CORONAVIRUS 2 BY RT PCR  LIPASE, BLOOD  ETHANOL  TROPONIN I (HIGH SENSITIVITY)  TROPONIN I (HIGH SENSITIVITY)    EKG None  Radiology DG Chest Portable 1 View  Result Date: 05/02/2023 CLINICAL DATA:  Shortness of breath EXAM: PORTABLE CHEST 1 VIEW COMPARISON:  04/24/2023 FINDINGS: The heart size and mediastinal contours are within normal limits. Coarsened bibasilar interstitial markings. No pleural effusion or pneumothorax. The visualized skeletal structures are unremarkable. IMPRESSION: Coarsened bibasilar interstitial markings, which may reflect bronchitic lung changes. Electronically Signed   By: Duanne Guess D.O.   On: 05/02/2023 12:50   CT Head Wo Contrast  Result Date: 05/02/2023 CLINICAL DATA:  Mental  status change, unknown cause EXAM: CT HEAD WITHOUT CONTRAST TECHNIQUE: Contiguous axial images were obtained from the base of the skull through the vertex without intravenous contrast. RADIATION DOSE REDUCTION: This exam was performed according to the departmental dose-optimization program which includes automated exposure control, adjustment of the mA and/or kV according to patient size and/or use of iterative reconstruction technique. COMPARISON:  Brain MRI 04/24/2023 FINDINGS: Brain: No evidence of acute infarction, hemorrhage, hydrocephalus, extra-axial collection or mass lesion/mass effect. Sequela of moderate chronic microvascular ischemic change. Chronic left thalamic infarct. Vascular: No hyperdense vessel or unexpected calcification. Skull: Normal. Negative for fracture or focal lesion. Sinuses/Orbits: No middle ear or mastoid effusion. Paranasal sinuses are clear. Orbits are unremarkable. Other: None. IMPRESSION: No acute intracranial abnormality. Electronically Signed   By: Lorenza Cambridge M.D.   On: 05/02/2023 12:27    Procedures Procedures    Medications Ordered in ED Medications  amLODipine (NORVASC) tablet 10 mg (has no administration in time range)  carvedilol (COREG) tablet 6.25 mg (has no administration in time range)  hydrochlorothiazide (HYDRODIURIL) tablet 12.5 mg (has no administration in time range)  lisinopril (ZESTRIL) tablet 40 mg (has no administration in time range)  benzonatate (TESSALON) capsule 100 mg (100 mg Oral Given 05/02/23 1334)  sodium chloride 0.9 % bolus 1,000 mL (1,000 mLs Intravenous New Bag/Given 05/02/23 1458)    ED Course/ Medical Decision Making/ A&P Clinical Course as of 05/02/23 1515  Mon May 02, 2023  1130 Discussed with CT to move patient to top of list given recent ICH. CT will take patient next. No neurological deficits on exam.  [CA]  1217 Glucose-Capillary(!): 305 [CA]  1217 Protein(!): >=300 [CA]  1440 Patient continuously coughing. Added COVID  test.  [CA]  1506 MR brain, if normal can dc home and FU with Neuro. Not compliant with meds. Probs HH? [BH]    Clinical Course User Index [BH] Henderly, Britni A, PA-C [CA] , Merla Riches, PA-C  Medical Decision Making Amount and/or Complexity of Data Reviewed External Data Reviewed: notes.    Details: Previous admission notes Labs: ordered. Decision-making details documented in ED Course. Radiology: ordered and independent interpretation performed. Decision-making details documented in ED Course.  Risk Prescription drug management.   This patient presents to the ED for concern of generalized weakness, this involves an extensive number of treatment options, and is a complaint that carries with it a high risk of complications and morbidity.  The differential diagnosis includes CVA, dehydration, electrolyte abnormalities, etc  67 year old male presents to the ED due to generalized weakness and dizziness which she describes as feeling off balance.  Recent admission for ICH.  Patient has been noncompliant with his medications.  Patient also admits to shortness of breath.  Upon arrival patient afebrile, not tachycardic or hypoxic.  BP 177/85.  Patient in no acute distress.  Normal neurological exam without any neurological deficits.  Routine labs ordered.  CT head to rule out further bleed.  Chest x-ray and cardiac labs due to shortness of breath.  Low suspicion for PE/DVT.  CBC reassuring.  No leukocytosis.  Mild anemia with hemoglobin at 12.  Creatinine 1.42 which appears to be around patient's baseline.  Mild elevation in alk phos at 131.  Normal AST and ALT.  Hyperglycemia at 313.  No anion gap.  Ethanol normal.  Lipase normal.  Low suspicion for pancreatitis.  CT head personally reviewed and interpreted to negative for any acute abnormalities.  Chest x-ray demonstrates worsening bibasilar interstitial markings likely bronchitic lung changes  Reassessed  patient at bedside.  Patient still admits to dizziness.  Given recent ICH and old infarcts will obtain MRI brain to rule out new CVA. BP still elevated. Home medications given.   Patient handed off to [facility], PA-C at shift change pending MRI brain. If unremarkable, patient may be discharged home with neurology follow-up.         Final Clinical Impression(s) / ED Diagnoses Final diagnoses:  Dizziness  Generalized weakness  Elevated blood pressure reading    Rx / DC Orders ED Discharge Orders     None         Mannie Stabile, [place] 05/02/23 1519

## 2023-05-02 NOTE — ED Triage Notes (Addendum)
[name] [place]. EMS report pt stated having generalized weakness and dizziness that started last PM. EMS started an IV and admin 250 mL of NaCl.   EMS Vitals  CBG 349 160/90 HR 102 SpO2 94% on R/A  Pt reports gradual onset of symptoms. Pt was out walking in the heat yesterday. Pt clarifies the dizziness as a lightheaded feeling. Pt states he was recently admitted for a CVA within the last week. Pt denies recent ETOH or illicit drug use. Pt states he has not been taking regular Rx because he is unable to read the instructions.

## 2023-05-06 ENCOUNTER — Encounter (HOSPITAL_COMMUNITY): Payer: Self-pay

## 2023-05-06 ENCOUNTER — Emergency Department (HOSPITAL_COMMUNITY)
Admission: EM | Admit: 2023-05-06 | Discharge: 2023-05-06 | Discharge status: Against Medical Advice | Payer: Medicaid Other | Attending: Emergency Medicine | Admitting: Emergency Medicine

## 2023-05-06 ENCOUNTER — Other Ambulatory Visit: Payer: Self-pay

## 2023-05-06 ENCOUNTER — Emergency Department (HOSPITAL_COMMUNITY): Payer: Medicaid Other

## 2023-05-06 DIAGNOSIS — E119 Type 2 diabetes mellitus without complications: Secondary | ICD-10-CM | POA: Diagnosis not present

## 2023-05-06 DIAGNOSIS — J45909 Unspecified asthma, uncomplicated: Secondary | ICD-10-CM | POA: Insufficient documentation

## 2023-05-06 DIAGNOSIS — I10 Essential (primary) hypertension: Secondary | ICD-10-CM | POA: Diagnosis not present

## 2023-05-06 DIAGNOSIS — Z5329 Procedure and treatment not carried out because of patient's decision for other reasons: Secondary | ICD-10-CM | POA: Insufficient documentation

## 2023-05-06 DIAGNOSIS — Z7951 Long term (current) use of inhaled steroids: Secondary | ICD-10-CM | POA: Diagnosis not present

## 2023-05-06 DIAGNOSIS — Z79899 Other long term (current) drug therapy: Secondary | ICD-10-CM | POA: Insufficient documentation

## 2023-05-06 DIAGNOSIS — D72829 Elevated white blood cell count, unspecified: Secondary | ICD-10-CM | POA: Diagnosis not present

## 2023-05-06 DIAGNOSIS — Z7984 Long term (current) use of oral hypoglycemic drugs: Secondary | ICD-10-CM | POA: Insufficient documentation

## 2023-05-06 DIAGNOSIS — N179 Acute kidney failure, unspecified: Secondary | ICD-10-CM | POA: Insufficient documentation

## 2023-05-06 DIAGNOSIS — R531 Weakness: Secondary | ICD-10-CM | POA: Diagnosis not present

## 2023-05-06 DIAGNOSIS — R55 Syncope and collapse: Secondary | ICD-10-CM | POA: Diagnosis present

## 2023-05-06 DIAGNOSIS — Z794 Long term (current) use of insulin: Secondary | ICD-10-CM | POA: Diagnosis not present

## 2023-05-06 LAB — BASIC METABOLIC PANEL
Anion gap: 17 — ABNORMAL HIGH (ref 5–15)
BUN: 31 mg/dL — ABNORMAL HIGH (ref 8–23)
CO2: 21 mmol/L — ABNORMAL LOW (ref 22–32)
Calcium: 9.3 mg/dL (ref 8.9–10.3)
Chloride: 104 mmol/L (ref 98–111)
Creatinine, Ser: 2.37 mg/dL — ABNORMAL HIGH (ref 0.61–1.24)
GFR, Estimated: 29 mL/min — ABNORMAL LOW (ref >60)
Glucose, Bld: 172 mg/dL — ABNORMAL HIGH (ref 70–99)
Potassium: 3.8 mmol/L (ref 3.5–5.1)
Sodium: 142 mmol/L (ref 135–145)

## 2023-05-06 LAB — URINALYSIS, ROUTINE W REFLEX MICROSCOPIC
Bilirubin Urine: NEGATIVE
Glucose, UA: 50 mg/dL — AB
Ketones, ur: NEGATIVE mg/dL
Leukocytes,Ua: NEGATIVE
Nitrite: NEGATIVE
Protein, ur: >=300 mg/dL — AB
Specific Gravity, Urine: 1.013 (ref 1.005–1.030)
pH: 5 (ref 5.0–8.0)

## 2023-05-06 LAB — CBC
HCT: 39.9 % (ref 39.0–52.0)
Hemoglobin: 13 g/dL (ref 13.0–17.0)
MCH: 28.5 pg (ref 26.0–34.0)
MCHC: 32.6 g/dL (ref 30.0–36.0)
MCV: 87.5 fL (ref 80.0–100.0)
Platelets: 242 10*3/uL (ref 150–400)
RBC: 4.56 MIL/uL (ref 4.22–5.81)
RDW: 13.9 % (ref 11.5–15.5)
WBC: 12.5 10*3/uL — ABNORMAL HIGH (ref 4.0–10.5)
nRBC: 0 % (ref 0.0–0.2)

## 2023-05-06 LAB — CK: Total CK: 91 U/L (ref 49–397)

## 2023-05-06 MED ORDER — SODIUM CHLORIDE 0.9 % IV BOLUS
1000.0000 mL | Freq: Once | INTRAVENOUS | Status: AC
  Administered 2023-05-06: 1000 mL via INTRAVENOUS

## 2023-05-06 MED ORDER — SODIUM CHLORIDE 0.9 % IV BOLUS: 1000.0000 mL | Freq: Once | INTRAVENOUS | Status: DC

## 2023-05-06 MED ORDER — SODIUM CHLORIDE 0.9 % IV BOLUS
500.0000 mL | Freq: Once | INTRAVENOUS | Status: AC
  Administered 2023-05-06: 500 mL via INTRAVENOUS

## 2023-05-06 NOTE — ED Notes (Signed)
Sandwich bag and soda provided at this time

## 2023-05-06 NOTE — Discharge Instructions (Signed)
It was a pleasure taking part in your care today.  As we discussed, you are leaving AGAINST MEDICAL ADVICE.  You are of sound mind and body to make these decisions for yourself.  We have offered you admission, IV fluids which you have stated you have no interest in pursuing at this time.  If you change your mind, please return to the ED for further management and care at your earliest convenience.  I would like for you to come back to the ED in 2 days to have your creatinine reevaluated.  Again, you are leaving AGAINST MEDICAL ADVICE.
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
## 2023-05-09 ENCOUNTER — Other Ambulatory Visit: Payer: Self-pay

## 2023-05-09 ENCOUNTER — Emergency Department (HOSPITAL_COMMUNITY)
Admission: EM | Admit: 2023-05-09 | Discharge: 2023-05-09 | Discharge status: Against Medical Advice | Payer: Medicaid Other | Attending: Emergency Medicine | Admitting: Emergency Medicine

## 2023-05-09 ENCOUNTER — Encounter (HOSPITAL_COMMUNITY): Payer: Self-pay | Admitting: Emergency Medicine

## 2023-05-09 DIAGNOSIS — Z5329 Procedure and treatment not carried out because of patient's decision for other reasons: Secondary | ICD-10-CM | POA: Diagnosis not present

## 2023-05-09 DIAGNOSIS — Z5900 Homelessness unspecified: Secondary | ICD-10-CM | POA: Diagnosis not present

## 2023-05-09 DIAGNOSIS — R42 Dizziness and giddiness: Secondary | ICD-10-CM | POA: Diagnosis not present

## 2023-05-09 NOTE — ED Provider Triage Note (Signed)
Emergency Medicine Provider Triage Evaluation Note  Justin Snow , a 67 y.o. male  was evaluated in triage.  Pt complains of dizziness. States symptoms have been ongoing x 1 month. States he is homeless and hasn't eaten anything in a week. He was here with similar symptoms 2 days ago and was found to have an AKI, given fluids and offered admission and then subsequently left AMA. Upon my initial evaluation, patient states that he does not want to stay for additional work-up. He refuses to answer any additional questions.  Review of Systems  Positive:  Negative:   Physical Exam  BP 98/61 (BP Location: Right Arm)   Pulse 78   Temp (!) 97.5 F (36.4 C)   Resp 20   Ht 5\' 6"  (1.676 m)   Wt 99.8 kg   SpO2 97%   BMI 35.51 kg/m  Gen:   Awake, no distress   Resp:  Normal effort  MSK:   Moves extremities without difficulty  Other:  Ambulatory with steady gait, patient deferred formal exam but no obvious ataxia or focal neurologic deficits.  Medical Decision Making  Medically screening exam initiated at 7:46 PM.  Appropriate orders placed.  Justin Snow was informed that the remainder of the evaluation will be completed by another provider, this initial triage assessment does not replace that evaluation, and the importance of remaining in the ED until their evaluation is complete.  Upon initial evaluation, patient is standing at the doorway stating he wants to leave without any further work-up. Discussed that given the nature of his complaint and that he was recommended to be admitted when seen 2 days ago for similar, discussed that patient would have to sign out AGAINST MEDICAL ADVICE.  We discussed the nature and purpose, risks and benefits, as well as, the alternatives of treatment. Time was given to allow the opportunity to ask questions and consider their options, and after the discussion, the patient decided to refuse the offerred treatment. The patient was informed that refusal  could lead to, but was not limited to, death, permanent disability, or severe pain. Prior to refusing, I determined that the patient had the capacity to make their decision and understood the consequences of that decision. After refusal, I made every reasonable opportunity to treat them to the best of my ability.  The patient was notified that they may return to the emergency department at any time for further treatment.      [name] 05/09/23 1952

## 2023-05-09 NOTE — ED Triage Notes (Signed)
BIB EMS for complaint of dizziness x 1 month.  Pt has been seen this week for same.  States he did not loss consciousness but felt like he might.

## 2023-05-09 NOTE — ED Notes (Signed)
Patient ambulating in the room, asking the RN and tech when he can leave. RN explained that provider is coming to see him and that he can leave if he chooses to but he should be seen by a provider first. Patient willing to wait at this time.

## 2023-05-11 ENCOUNTER — Emergency Department (HOSPITAL_COMMUNITY)
Admission: EM | Admit: 2023-05-11 | Discharge: 2023-05-11 | Disposition: A | Discharge status: Home / Self Care | Attending: Emergency Medicine | Admitting: Emergency Medicine

## 2023-05-11 ENCOUNTER — Emergency Department (HOSPITAL_COMMUNITY)

## 2023-05-11 ENCOUNTER — Encounter (HOSPITAL_COMMUNITY): Payer: Self-pay

## 2023-05-11 DIAGNOSIS — Y900 Blood alcohol level of less than 20 mg/100 ml: Secondary | ICD-10-CM | POA: Insufficient documentation

## 2023-05-11 DIAGNOSIS — Z20822 Contact with and (suspected) exposure to covid-19: Secondary | ICD-10-CM | POA: Diagnosis not present

## 2023-05-11 DIAGNOSIS — R4182 Altered mental status, unspecified: Secondary | ICD-10-CM | POA: Diagnosis present

## 2023-05-11 DIAGNOSIS — J45909 Unspecified asthma, uncomplicated: Secondary | ICD-10-CM | POA: Diagnosis not present

## 2023-05-11 DIAGNOSIS — Z7984 Long term (current) use of oral hypoglycemic drugs: Secondary | ICD-10-CM | POA: Diagnosis not present

## 2023-05-11 DIAGNOSIS — Z7951 Long term (current) use of inhaled steroids: Secondary | ICD-10-CM | POA: Insufficient documentation

## 2023-05-11 DIAGNOSIS — R410 Disorientation, unspecified: Secondary | ICD-10-CM | POA: Insufficient documentation

## 2023-05-11 DIAGNOSIS — Z79899 Other long term (current) drug therapy: Secondary | ICD-10-CM | POA: Insufficient documentation

## 2023-05-11 DIAGNOSIS — F1410 Cocaine abuse, uncomplicated: Secondary | ICD-10-CM | POA: Diagnosis not present

## 2023-05-11 DIAGNOSIS — E1165 Type 2 diabetes mellitus with hyperglycemia: Secondary | ICD-10-CM | POA: Insufficient documentation

## 2023-05-11 DIAGNOSIS — I10 Essential (primary) hypertension: Secondary | ICD-10-CM | POA: Diagnosis not present

## 2023-05-11 DIAGNOSIS — Z794 Long term (current) use of insulin: Secondary | ICD-10-CM | POA: Insufficient documentation

## 2023-05-11 LAB — CBG MONITORING, ED: Glucose-Capillary: 309 mg/dL — ABNORMAL HIGH (ref 70–99)

## 2023-05-11 LAB — I-STAT CG4 LACTIC ACID, ED: Lactic Acid, Venous: 1.8 mmol/L (ref 0.5–1.9)

## 2023-05-11 LAB — URINALYSIS, W/ REFLEX TO CULTURE (INFECTION SUSPECTED)
Bilirubin Urine: NEGATIVE
Glucose, UA: 150 mg/dL — AB
Ketones, ur: NEGATIVE mg/dL
Leukocytes,Ua: NEGATIVE
Nitrite: NEGATIVE
Protein, ur: >=300 mg/dL — AB
Specific Gravity, Urine: 1.012 (ref 1.005–1.030)
pH: 5 (ref 5.0–8.0)

## 2023-05-11 LAB — COMPREHENSIVE METABOLIC PANEL
ALT: 13 U/L (ref 0–44)
AST: 14 U/L — ABNORMAL LOW (ref 15–41)
Albumin: 3.5 g/dL (ref 3.5–5.0)
Alkaline Phosphatase: 123 U/L (ref 38–126)
Anion gap: 10 (ref 5–15)
BUN: 30 mg/dL — ABNORMAL HIGH (ref 8–23)
CO2: 22 mmol/L (ref 22–32)
Calcium: 8.6 mg/dL — ABNORMAL LOW (ref 8.9–10.3)
Chloride: 99 mmol/L (ref 98–111)
Creatinine, Ser: 1.83 mg/dL — ABNORMAL HIGH (ref 0.61–1.24)
GFR, Estimated: 40 mL/min — ABNORMAL LOW (ref >60)
Glucose, Bld: 296 mg/dL — ABNORMAL HIGH (ref 70–99)
Potassium: 3.4 mmol/L — ABNORMAL LOW (ref 3.5–5.1)
Sodium: 131 mmol/L — ABNORMAL LOW (ref 135–145)
Total Bilirubin: 0.8 mg/dL (ref 0.3–1.2)
Total Protein: 6.9 g/dL (ref 6.5–8.1)

## 2023-05-11 LAB — CBC WITH DIFFERENTIAL/PLATELET
Abs Immature Granulocytes: 0.1 10*3/uL — ABNORMAL HIGH (ref 0.00–0.07)
Basophils Absolute: 0.1 10*3/uL (ref 0.0–0.1)
Basophils Relative: 0 %
Eosinophils Absolute: 0.4 10*3/uL (ref 0.0–0.5)
Eosinophils Relative: 2 %
HCT: 39.2 % (ref 39.0–52.0)
Hemoglobin: 13 g/dL (ref 13.0–17.0)
Immature Granulocytes: 1 %
Lymphocytes Relative: 5 %
Lymphs Abs: 0.9 10*3/uL (ref 0.7–4.0)
MCH: 29.4 pg (ref 26.0–34.0)
MCHC: 33.2 g/dL (ref 30.0–36.0)
MCV: 88.7 fL (ref 80.0–100.0)
Monocytes Absolute: 0.9 10*3/uL (ref 0.1–1.0)
Monocytes Relative: 5 %
Neutro Abs: 16.3 10*3/uL — ABNORMAL HIGH (ref 1.7–7.7)
Neutrophils Relative %: 87 %
Platelets: 161 10*3/uL (ref 150–400)
RBC: 4.42 MIL/uL (ref 4.22–5.81)
RDW: 13.9 % (ref 11.5–15.5)
WBC: 18.5 10*3/uL — ABNORMAL HIGH (ref 4.0–10.5)
nRBC: 0 % (ref 0.0–0.2)

## 2023-05-11 LAB — SARS CORONAVIRUS 2 BY RT PCR: SARS Coronavirus 2 by RT PCR: NEGATIVE

## 2023-05-11 LAB — ETHANOL: Alcohol, Ethyl (B): <10 mg/dL (ref <10)

## 2023-05-11 LAB — RAPID URINE DRUG SCREEN, HOSP PERFORMED
Amphetamines: NOT DETECTED
Barbiturates: NOT DETECTED
Benzodiazepines: NOT DETECTED
Cocaine: POSITIVE — AB
Opiates: NOT DETECTED
Tetrahydrocannabinol: NOT DETECTED

## 2023-05-11 MED ORDER — LACTATED RINGERS IV BOLUS
1000.0000 mL | Freq: Once | INTRAVENOUS | Status: AC
  Administered 2023-05-11: 1000 mL via INTRAVENOUS

## 2023-05-11 NOTE — ED Provider Notes (Signed)
[place] EMERGENCY DEPARTMENT AT [facility] Provider Note   CSN: [id number] Arrival date & time: 05/11/23  0757     History  Chief Complaint  Patient presents with   Altered Mental Status    Justin Snow is a 67 y.o. male.   Altered Mental Status Patient brought in from jail.  Reportedly had fever 100.7.  Mental status change.  Patient states he feels bad but cannot really provide much history.  Please also cannot really give much history.  Reportedly had some urinary incontinence.  Has had recent visits to the ER for mental status change and dizziness.  Denies abdominal pain.  States he has had a little bit of a cough.   Past Medical History:  Diagnosis Date   Asthma    Diabetes mellitus without complication (HCC)    Hypertension      Problem Home Medications Prior to Admission medications   Medication Sig Start Date End Date Taking? Authorizing Provider  albuterol (VENTOLIN HFA) 108 (90 Base) MCG/ACT inhaler Inhale into the lungs every 6 (six) hours as needed for wheezing or shortness of breath.    [provider]  amLODipine (NORVASC) 10 MG tablet Take 1 tablet (10 mg total) by mouth daily. 04/27/23   Amin, Loura Halt, MD  atorvastatin (LIPITOR) 10 MG tablet Take 1 tablet (10 mg total) by mouth at bedtime. 04/27/23   Amin, Loura Halt, MD  Blood Glucose Monitoring Suppl (BLOOD GLUCOSE MONITOR SYSTEM) w/Device KIT Use 3 (three) times daily. 04/27/23   Amin, Loura Halt, MD  carvedilol (COREG) 6.25 MG tablet Take 1 tablet (6.25 mg total) by mouth 2 (two) times daily. 04/27/23   Amin, Loura Halt, MD  DULoxetine (CYMBALTA) 30 MG capsule Take 1 capsule (30 mg total) by mouth 2 (two) times daily. 04/27/23   Amin, Loura Halt, MD  fluticasone-salmeterol (ADVAIR HFA) 366-44 MCG/ACT inhaler Inhale 2 puffs into the lungs 2 (two) times daily.    [provider]  gabapentin (NEURONTIN) 400 MG capsule Take 400 mg by mouth 3 (three) times daily.  05/25/20   [provider]  Glucagon (GVOKE HYPOPEN 2-PACK) 1 MG/0.2ML SOAJ Inject 1 mg into the skin as needed for up to 2 doses (Severe low blood sugar). 04/27/23   Amin, Loura Halt, MD  Glucose Blood (BLOOD GLUCOSE TEST STRIPS) STRP Use as directed to check blood sugar three times daily 04/27/23   Amin, Loura Halt, MD  hydrochlorothiazide (HYDRODIURIL) 12.5 MG tablet Take 1 tablet (12.5 mg total) by mouth daily. 04/28/23   Amin, Loura Halt, MD  insulin aspart (NOVOLOG) 100 UNIT/ML FlexPen Inject 0-6 Units into the skin 3 (three) times daily with meals. Check Blood Glucose (BG) and inject per scale: BG <150= 0 unit; BG 150-200= 1 unit; BG 201-250= 2 unit; BG 251-300= 3 unit; BG 301-350= 4 unit; BG 351-400= 5 unit; BG >400= 6 unit and Call Primary Care. 04/27/23   Amin, Ankit Chirag, MD  insulin glargine-yfgn (SEMGLEE, YFGN,) 100 UNIT/ML Pen Inject 15 Units into the skin daily. 04/27/23   Amin, Loura Halt, MD  Insulin Pen Needle 31G X 8 MM MISC Use 3 (three) times daily. 04/27/23   Amin, Loura Halt, MD  Insulin Syringe-Needle U-100 (INSULIN SYRINGE .5CC/30GX1/2") 30G X 1/2" 0.5 ML MISC Use as directed with insulin. 10/18/19   Mardella Layman, MD  Lancet Device MISC Use 3 (three) times daily. May dispense any manufacturer covered by patient's insurance 04/27/23   Dimple Nanas, MD  lisinopril (ZESTRIL) 40 MG tablet Take 1 tablet (40 mg total) by mouth daily. 04/27/23   Amin, Loura Halt, MD  metFORMIN (GLUCOPHAGE) 1000 MG tablet Take 1 tablet (1,000 mg total) by mouth 2 (two) times daily. 04/27/23 07/26/23  Amin, Loura Halt, MD  omeprazole (PRILOSEC) 20 MG capsule Take 1 capsule (20 mg total) by mouth every morning. 04/27/23   Amin, Loura Halt, MD  potassium chloride (MICRO-K) 10 MEQ CR capsule Take 10 mEq by mouth daily. 05/12/20   [provider]  TRUEplus Lancets 28G MISC Use 3 (three) times daily as directed to check blood sugar 04/27/23   Amin, Loura Halt, MD       Allergies    Patient has no known allergies.    Review of Systems   Review of Systems  Physical Exam Updated Vital Signs BP (!) 161/64 (BP Location: Left Arm)   Pulse 81   Temp 98.4 F (36.9 C) (Oral)   Resp 20   Ht 5\' 6"  (1.676 m)   Wt 99.8 kg   SpO2 96%   BMI 35.51 kg/m  Physical Exam Vitals reviewed.  Constitutional:      Comments: Sitting in bed with eyes closed.  Patient is handcuffed.  Eyes:     Pupils: Pupils are equal, round, and reactive to light.  Cardiovascular:     Rate and Rhythm: Regular rhythm.  Pulmonary:     Breath sounds: No wheezing or rhonchi.  Abdominal:     Tenderness: There is no abdominal tenderness.  Musculoskeletal:     Cervical back: Neck supple.  Skin:    Capillary Refill: Capillary refill takes less than 2 seconds.  Neurological:     Comments: Awake answer some questions but somewhat slow to answer.  May have some confusion.     ED Results / Procedures / Treatments   Labs (all labs ordered are listed, but only abnormal results are displayed) Labs Reviewed  CULTURE, BLOOD (ROUTINE X 2)  CULTURE, BLOOD (ROUTINE X 2)  SARS CORONAVIRUS 2 BY RT PCR  RAPID URINE DRUG SCREEN, HOSP PERFORMED  URINALYSIS, W/ REFLEX TO CULTURE (INFECTION SUSPECTED)  COMPREHENSIVE METABOLIC PANEL  CBC WITH DIFFERENTIAL/PLATELET  ETHANOL  I-STAT CG4 LACTIC ACID, ED  CBG MONITORING, ED    EKG None  Radiology No results found.  Procedures Procedures    Medications Ordered in ED Medications - No data to display  ED Course/ Medical Decision Making/ A&P                                 Medical Decision Making Amount and/or Complexity of Data Reviewed Labs: ordered. Radiology: ordered.   Patient brought in by police.  Reportedly fever at jail of 100.7 although afebrile here.  Differential diagnosis includes infection.  Reviewed recent discharge notes and ER notes.  Will workup for potential infection.  Also has had intracranial hemorrhage  recently will get head CT.        Final Clinical Impression(s) / ED Diagnoses Final diagnoses:  None    Rx / DC Orders ED Discharge Orders     None         Benjiman Core, MD 05/11/23 1536

## 2023-05-11 NOTE — Discharge Instructions (Addendum)
Your workup was reassuring here.  No fever.  The kidney function is mildly increased but improved from what it was 5 days ago.  Sugar also elevated and will need his medicines.

## 2023-05-11 NOTE — ED Notes (Signed)
Patient was asked to urinate, stated he could not. I gave him a urinal and requested him to urinate at another time. I came back in the 3rd time he had urinated all over the floor instead of the urinal.

## 2023-05-11 NOTE — ED Triage Notes (Signed)
The pt bib sheriff. AMS with fever (temp 100.7), B/P 151/70, P 101, R 18, O2 Sats 98% RA, CBG 217.

## 2023-05-15 ENCOUNTER — Other Ambulatory Visit: Payer: Self-pay

## 2023-05-15 ENCOUNTER — Inpatient Hospital Stay (HOSPITAL_COMMUNITY)
Admission: EM | Admit: 2023-05-15 | Discharge: 2023-05-17 | DRG: 871 | Payer: Medicaid Other | Attending: Internal Medicine | Admitting: Internal Medicine

## 2023-05-15 ENCOUNTER — Emergency Department (HOSPITAL_COMMUNITY): Payer: Medicaid Other

## 2023-05-15 ENCOUNTER — Encounter (HOSPITAL_COMMUNITY): Payer: Self-pay

## 2023-05-15 DIAGNOSIS — L03114 Cellulitis of left upper limb: Secondary | ICD-10-CM | POA: Diagnosis present

## 2023-05-15 DIAGNOSIS — Z8673 Personal history of transient ischemic attack (TIA), and cerebral infarction without residual deficits: Secondary | ICD-10-CM

## 2023-05-15 DIAGNOSIS — Z5900 Homelessness unspecified: Secondary | ICD-10-CM

## 2023-05-15 DIAGNOSIS — I10 Essential (primary) hypertension: Secondary | ICD-10-CM | POA: Diagnosis present

## 2023-05-15 DIAGNOSIS — F1410 Cocaine abuse, uncomplicated: Secondary | ICD-10-CM | POA: Diagnosis present

## 2023-05-15 DIAGNOSIS — E785 Hyperlipidemia, unspecified: Secondary | ICD-10-CM | POA: Diagnosis present

## 2023-05-15 DIAGNOSIS — Z794 Long term (current) use of insulin: Secondary | ICD-10-CM

## 2023-05-15 DIAGNOSIS — A419 Sepsis, unspecified organism: Principal | ICD-10-CM | POA: Diagnosis present

## 2023-05-15 DIAGNOSIS — E876 Hypokalemia: Secondary | ICD-10-CM | POA: Diagnosis present

## 2023-05-15 DIAGNOSIS — J45909 Unspecified asthma, uncomplicated: Secondary | ICD-10-CM | POA: Diagnosis present

## 2023-05-15 DIAGNOSIS — Z79899 Other long term (current) drug therapy: Secondary | ICD-10-CM

## 2023-05-15 DIAGNOSIS — F1910 Other psychoactive substance abuse, uncomplicated: Secondary | ICD-10-CM | POA: Diagnosis present

## 2023-05-15 DIAGNOSIS — I129 Hypertensive chronic kidney disease with stage 1 through stage 4 chronic kidney disease, or unspecified chronic kidney disease: Secondary | ICD-10-CM | POA: Diagnosis present

## 2023-05-15 DIAGNOSIS — E1122 Type 2 diabetes mellitus with diabetic chronic kidney disease: Secondary | ICD-10-CM | POA: Diagnosis present

## 2023-05-15 DIAGNOSIS — Z8249 Family history of ischemic heart disease and other diseases of the circulatory system: Secondary | ICD-10-CM

## 2023-05-15 DIAGNOSIS — F17210 Nicotine dependence, cigarettes, uncomplicated: Secondary | ICD-10-CM | POA: Diagnosis present

## 2023-05-15 DIAGNOSIS — G928 Other toxic encephalopathy: Secondary | ICD-10-CM | POA: Diagnosis present

## 2023-05-15 DIAGNOSIS — N170 Acute kidney failure with tubular necrosis: Secondary | ICD-10-CM | POA: Diagnosis present

## 2023-05-15 DIAGNOSIS — E119 Type 2 diabetes mellitus without complications: Secondary | ICD-10-CM

## 2023-05-15 DIAGNOSIS — Z7951 Long term (current) use of inhaled steroids: Secondary | ICD-10-CM

## 2023-05-15 DIAGNOSIS — Z7984 Long term (current) use of oral hypoglycemic drugs: Secondary | ICD-10-CM

## 2023-05-15 DIAGNOSIS — N179 Acute kidney failure, unspecified: Secondary | ICD-10-CM

## 2023-05-15 DIAGNOSIS — N1831 Chronic kidney disease, stage 3a: Secondary | ICD-10-CM | POA: Diagnosis present

## 2023-05-15 LAB — COMPREHENSIVE METABOLIC PANEL
ALT: 21 U/L (ref 0–44)
AST: 28 U/L (ref 15–41)
Albumin: 2.7 g/dL — ABNORMAL LOW (ref 3.5–5.0)
Alkaline Phosphatase: 123 U/L (ref 38–126)
Anion gap: 11 (ref 5–15)
BUN: 60 mg/dL — ABNORMAL HIGH (ref 8–23)
CO2: 25 mmol/L (ref 22–32)
Calcium: 8.2 mg/dL — ABNORMAL LOW (ref 8.9–10.3)
Chloride: 95 mmol/L — ABNORMAL LOW (ref 98–111)
Creatinine, Ser: 2.14 mg/dL — ABNORMAL HIGH (ref 0.61–1.24)
GFR, Estimated: 33 mL/min — ABNORMAL LOW (ref >60)
Glucose, Bld: 233 mg/dL — ABNORMAL HIGH (ref 70–99)
Potassium: 3.2 mmol/L — ABNORMAL LOW (ref 3.5–5.1)
Sodium: 131 mmol/L — ABNORMAL LOW (ref 135–145)
Total Bilirubin: 0.7 mg/dL (ref 0.3–1.2)
Total Protein: 6.4 g/dL — ABNORMAL LOW (ref 6.5–8.1)

## 2023-05-15 LAB — I-STAT CG4 LACTIC ACID, ED: Lactic Acid, Venous: 1.6 mmol/L (ref 0.5–1.9)

## 2023-05-15 LAB — CBC WITH DIFFERENTIAL/PLATELET
Abs Immature Granulocytes: 0.22 10*3/uL — ABNORMAL HIGH (ref 0.00–0.07)
Basophils Absolute: 0.1 10*3/uL (ref 0.0–0.1)
Basophils Relative: 0 %
Eosinophils Absolute: 0 10*3/uL (ref 0.0–0.5)
Eosinophils Relative: 0 %
HCT: 35.9 % — ABNORMAL LOW (ref 39.0–52.0)
Hemoglobin: 12.2 g/dL — ABNORMAL LOW (ref 13.0–17.0)
Immature Granulocytes: 1 %
Lymphocytes Relative: 5 %
Lymphs Abs: 1 10*3/uL (ref 0.7–4.0)
MCH: 29.3 pg (ref 26.0–34.0)
MCHC: 34 g/dL (ref 30.0–36.0)
MCV: 86.3 fL (ref 80.0–100.0)
Monocytes Absolute: 1.6 10*3/uL — ABNORMAL HIGH (ref 0.1–1.0)
Monocytes Relative: 8 %
Neutro Abs: 17.4 10*3/uL — ABNORMAL HIGH (ref 1.7–7.7)
Neutrophils Relative %: 86 %
Platelets: 227 10*3/uL (ref 150–400)
RBC: 4.16 MIL/uL — ABNORMAL LOW (ref 4.22–5.81)
RDW: 13.8 % (ref 11.5–15.5)
WBC: 20.3 10*3/uL — ABNORMAL HIGH (ref 4.0–10.5)
nRBC: 0 % (ref 0.0–0.2)

## 2023-05-15 LAB — CBG MONITORING, ED: Glucose-Capillary: 251 mg/dL — ABNORMAL HIGH (ref 70–99)

## 2023-05-15 LAB — CK: Total CK: 267 U/L (ref 49–397)

## 2023-05-15 MED ORDER — SODIUM CHLORIDE 0.9 % IV BOLUS
1000.0000 mL | Freq: Once | INTRAVENOUS | Status: AC
  Administered 2023-05-15: 1000 mL via INTRAVENOUS

## 2023-05-15 MED ORDER — VANCOMYCIN HCL 2000 MG/400ML IV SOLN
2000.0000 mg | Freq: Once | INTRAVENOUS | Status: AC
  Administered 2023-05-15: 2000 mg via INTRAVENOUS
  Filled 2023-05-15: qty 400

## 2023-05-15 MED ORDER — SODIUM CHLORIDE 0.9 % IV SOLN
1.5000 g | Freq: Once | INTRAVENOUS | Status: AC
  Administered 2023-05-15: 1.5 g via INTRAVENOUS
  Filled 2023-05-15: qty 4

## 2023-05-15 MED ORDER — POTASSIUM CHLORIDE 10 MEQ/100ML IV SOLN
10.0000 meq | Freq: Once | INTRAVENOUS | Status: AC
  Administered 2023-05-15: 10 meq via INTRAVENOUS
  Filled 2023-05-15: qty 100

## 2023-05-15 NOTE — Progress Notes (Signed)
A consult was received from an ED physician for vancomycin per pharmacy dosing.  The patient's profile has been reviewed for ht/wt/allergies/indication/available labs.    No Known Allergies Weight recorded as 99.8 kg  A one time order has been placed for vancomycin 2000 mg IV once.    Further antibiotics/pharmacy consults should be ordered by admitting physician if indicated.                       Thank you, Cindi Carbon, PharmD 05/15/2023  9:44 PM

## 2023-05-15 NOTE — ED Triage Notes (Signed)
Pt. Arrives with [facility] for an infection on his arm and new onset confusion

## 2023-05-15 NOTE — ED Provider Notes (Addendum)
[place] EMERGENCY DEPARTMENT AT [facility] Provider Note   CSN: [id number] Arrival date & time: 05/15/23  2033     History  Chief Complaint  Patient presents with   Altered Mental Status    Justin Snow is a 67 y.o. male.  67 year old male with prior medical history as detailed below presents for evaluation. He arrives from jail with LE escort.   Patient sent for suspected cellulitis of left arm. Patient reports pain, swelling, redness to left forearm times 1-2 days.     The history is provided by the patient and medical records.       Home Medications Prior to Admission medications   Medication Sig Start Date End Date Taking? Authorizing Provider  albuterol (VENTOLIN HFA) 108 (90 Base) MCG/ACT inhaler Inhale into the lungs every 6 (six) hours as needed for wheezing or shortness of breath.    [provider]  amLODipine (NORVASC) 10 MG tablet Take 1 tablet (10 mg total) by mouth daily. 04/27/23   Amin, Ankit C, MD  atorvastatin (LIPITOR) 10 MG tablet Take 1 tablet (10 mg total) by mouth at bedtime. 04/27/23   Amin, Ankit C, MD  Blood Glucose Monitoring Suppl (BLOOD GLUCOSE MONITOR SYSTEM) w/Device KIT Use 3 (three) times daily. 04/27/23   Amin, Ankit C, MD  carvedilol (COREG) 6.25 MG tablet Take 1 tablet (6.25 mg total) by mouth 2 (two) times daily. 04/27/23   Amin, Ankit C, MD  DULoxetine (CYMBALTA) 30 MG capsule Take 1 capsule (30 mg total) by mouth 2 (two) times daily. 04/27/23   Miguel Rota, MD  fluticasone-salmeterol (ADVAIR HFA) 829-56 MCG/ACT inhaler Inhale 2 puffs into the lungs 2 (two) times daily.    [provider]  gabapentin (NEURONTIN) 400 MG capsule Take 400 mg by mouth 3 (three) times daily. 05/25/20   [provider]  Glucagon (GVOKE HYPOPEN 2-PACK) 1 MG/0.2ML SOAJ Inject 1 mg into the skin as needed for up to 2 doses (Severe low blood sugar). 04/27/23   Amin, Ankit C, MD  Glucose Blood (BLOOD GLUCOSE TEST STRIPS)  STRP Use as directed to check blood sugar three times daily 04/27/23   Amin, Ankit C, MD  hydrochlorothiazide (HYDRODIURIL) 12.5 MG tablet Take 1 tablet (12.5 mg total) by mouth daily. 04/28/23   Amin, Ankit C, MD  insulin aspart (NOVOLOG) 100 UNIT/ML FlexPen Inject 0-6 Units into the skin 3 (three) times daily with meals. Check Blood Glucose (BG) and inject per scale: BG <150= 0 unit; BG 150-200= 1 unit; BG 201-250= 2 unit; BG 251-300= 3 unit; BG 301-350= 4 unit; BG 351-400= 5 unit; BG >400= 6 unit and Call Primary Care. 04/27/23   Amin, Ankit C, MD  insulin glargine-yfgn (SEMGLEE, YFGN,) 100 UNIT/ML Pen Inject 15 Units into the skin daily. 04/27/23   Amin, Ankit C, MD  Insulin Pen Needle 31G X 8 MM MISC Use 3 (three) times daily. 04/27/23   Amin, Ankit C, MD  Insulin Syringe-Needle U-100 (INSULIN SYRINGE .5CC/30GX1/2") 30G X 1/2" 0.5 ML MISC Use as directed with insulin. 10/18/19   Mardella Layman, MD  Lancet Device MISC Use 3 (three) times daily. May dispense any manufacturer covered by patient's insurance 04/27/23   Amin, Ankit C, MD  lisinopril (ZESTRIL) 40 MG tablet Take 1 tablet (40 mg total) by mouth daily. 04/27/23   Amin, Ankit C, MD  metFORMIN (GLUCOPHAGE) 1000 MG tablet Take 1 tablet (1,000 mg total) by mouth 2 (two) times daily. 04/27/23  07/26/23  Amin, Ankit C, MD  omeprazole (PRILOSEC) 20 MG capsule Take 1 capsule (20 mg total) by mouth every morning. 04/27/23   Amin, Ankit C, MD  potassium chloride (MICRO-K) 10 MEQ CR capsule Take 10 mEq by mouth daily. 05/12/20   [provider]  TRUEplus Lancets 28G MISC Use 3 (three) times daily as directed to check blood sugar 04/27/23   Miguel Rota, MD      Allergies    Patient has no known allergies.    Review of Systems   Review of Systems  All other systems reviewed and are negative.   Physical Exam Updated Vital Signs BP (!) 121/51   Pulse 97   Temp 98.3 F (36.8 C) (Oral)   Resp (!) 22   Ht 5\' 7"  (1.702 m)   Wt 99.8 kg   SpO2 100%    BMI 34.46 kg/m  Physical Exam Vitals and nursing note reviewed.  Constitutional:      General: He is not in acute distress.    Appearance: Normal appearance. He is well-developed.  HENT:     Head: Normocephalic and atraumatic.  Eyes:     Conjunctiva/sclera: Conjunctivae normal.     Pupils: Pupils are equal, round, and reactive to light.  Cardiovascular:     Rate and Rhythm: Normal rate and regular rhythm.     Heart sounds: Normal heart sounds.  Pulmonary:     Effort: Pulmonary effort is normal. No respiratory distress.     Breath sounds: Normal breath sounds.  Abdominal:     General: There is no distension.     Palpations: Abdomen is soft.     Tenderness: There is no abdominal tenderness.  Musculoskeletal:        General: No deformity. Normal range of motion.     Cervical back: Normal range of motion and neck supple.  Skin:    General: Skin is warm and dry.     Findings: Erythema present.     Comments: Significant edema / erythema to LUE with erythema streaking proximally to left axilla   No discrete area of fluctuance suggestive of abscess  See images below   Neurological:     General: No focal deficit present.     Mental Status: He is alert and oriented to person, place, and time.        ED Results / Procedures / Treatments   Labs (all labs ordered are listed, but only abnormal results are displayed) Labs Reviewed  CBC WITH DIFFERENTIAL/PLATELET - Abnormal; Notable for the following components:      Result Value   WBC 20.3 (*)    RBC 4.16 (*)    Hemoglobin 12.2 (*)    HCT 35.9 (*)    Neutro Abs 17.4 (*)    Monocytes Absolute 1.6 (*)    Abs Immature Granulocytes 0.22 (*)    All other components within normal limits  CBG MONITORING, ED - Abnormal; Notable for the following components:   Glucose-Capillary 251 (*)    All other components within normal limits  CULTURE, BLOOD (ROUTINE X 2)  CULTURE, BLOOD (ROUTINE X 2)  COMPREHENSIVE METABOLIC PANEL  CK   I-STAT CG4 LACTIC ACID, ED    EKG EKG Interpretation Date/Time:  Sunday May 15 2023 21:41:09 EDT Ventricular Rate:  91 PR Interval:  165 QRS Duration:  79 QT Interval:  330 QTC Calculation: 406 R Axis:   -24  Text Interpretation: Sinus rhythm Borderline left axis deviation Low voltage, precordial  leads Abnormal R-wave progression, early transition No acute changes Confirmed by Anders Simmonds [phone]) on 05/15/2023 9:43:58 PM  Radiology DG Forearm Left  Result Date: 05/15/2023 CLINICAL DATA:  Infection on arm with new onset confusion. Arm swollen from shoulder to fingertips. EXAM: LEFT FOREARM - 2 VIEW; LEFT ELBOW - 2 VIEW COMPARISON:  None Available. FINDINGS: Diffuse soft tissue swelling about the elbow and forearm. No acute fracture or dislocation. No evidence of osteomyelitis. No elbow joint effusion. IMPRESSION: Diffuse soft tissue swelling of the left arm without acute osseous abnormality. Electronically Signed   By: Minerva Fester M.D.   On: 05/15/2023 21:41   DG Elbow 2 Views Left  Result Date: 05/15/2023 CLINICAL DATA:  Infection on arm with new onset confusion. Arm swollen from shoulder to fingertips. EXAM: LEFT FOREARM - 2 VIEW; LEFT ELBOW - 2 VIEW COMPARISON:  None Available. FINDINGS: Diffuse soft tissue swelling about the elbow and forearm. No acute fracture or dislocation. No evidence of osteomyelitis. No elbow joint effusion. IMPRESSION: Diffuse soft tissue swelling of the left arm without acute osseous abnormality. Electronically Signed   By: Minerva Fester M.D.   On: 05/15/2023 21:41    Procedures Procedures    Medications Ordered in ED Medications  vancomycin (VANCOREADY) IVPB 2000 mg/400 mL (has no administration in time range)  ampicillin-sulbactam (UNASYN) 1.5 g in sodium chloride 0.9 % 100 mL IVPB (1.5 g Intravenous New Bag/Given 05/15/23 2200)  sodium chloride 0.9 % bolus 1,000 mL (1,000 mLs Intravenous New Bag/Given 05/15/23 2200)    ED Course/ Medical  Decision Making/ A&P                                 Medical Decision Making Amount and/or Complexity of Data Reviewed Labs: ordered. Radiology: ordered.  Risk Prescription drug management.    Medical Screen Complete  This patient presented to the ED with complaint of LUE cellulitis.  This complaint involves an extensive number of treatment options. The initial differential diagnosis includes, but is not limited to, cellulitis  This presentation is: Acute, Self-Limited, Previously Undiagnosed, Uncertain Prognosis, Complicated, Systemic Symptoms, and Threat to Life/Bodily Function  Patient in LE custody (arrives from jail) - presents with LUE edema / erythema consistent with cellulitis.   WBC 20.3. Cr 2.14. BUN 60. K 3.2. No FB or air on plain films.  IV Antibiotics administered.   Patient will require admission.  Dr. Victorino Dike (Hand) ([facility]) aware of consult.  Hospitalist made aware of case.   Additional history obtained: External records from outside sources obtained and reviewed including prior ED visits and prior Inpatient records.   Problem List / ED Course:  LUE cellulitis, AKI, Hypokalemia   Disposition:  After consideration of the diagnostic results and the patients response to treatment, I feel that the patent would benefit from admission.   CRITICAL CARE Performed by: Wynetta Fines   Total critical care time: 30 minutes  Critical care time was exclusive of separately billable procedures and treating other patients.  Critical care was necessary to treat or prevent imminent or life-threatening deterioration.  Critical care was time spent personally by me on the following activities: development of treatment plan with patient and/or surrogate as well as nursing, discussions with consultants, evaluation of patient's response to treatment, examination of patient, obtaining history from patient or surrogate, ordering and performing treatments and  interventions, ordering and review of laboratory studies, ordering and review of  radiographic studies, pulse oximetry and re-evaluation of patient's condition.          Final Clinical Impression(s) / ED Diagnoses Final diagnoses:  Cellulitis of left upper extremity  AKI (acute kidney injury) [facility])    Rx / DC Orders ED Discharge Orders     None         Wynetta Fines, MD 05/15/23 2245    Wynetta Fines, MD 05/15/23 [phone]

## 2023-05-16 ENCOUNTER — Inpatient Hospital Stay (HOSPITAL_COMMUNITY): Payer: Medicaid Other

## 2023-05-16 DIAGNOSIS — Z5900 Homelessness unspecified: Secondary | ICD-10-CM | POA: Diagnosis not present

## 2023-05-16 DIAGNOSIS — Z8673 Personal history of transient ischemic attack (TIA), and cerebral infarction without residual deficits: Secondary | ICD-10-CM

## 2023-05-16 DIAGNOSIS — A419 Sepsis, unspecified organism: Secondary | ICD-10-CM | POA: Diagnosis present

## 2023-05-16 DIAGNOSIS — Z79899 Other long term (current) drug therapy: Secondary | ICD-10-CM | POA: Diagnosis not present

## 2023-05-16 DIAGNOSIS — G928 Other toxic encephalopathy: Secondary | ICD-10-CM | POA: Diagnosis present

## 2023-05-16 DIAGNOSIS — N1831 Chronic kidney disease, stage 3a: Secondary | ICD-10-CM | POA: Diagnosis present

## 2023-05-16 DIAGNOSIS — N183 Chronic kidney disease, stage 3 unspecified: Secondary | ICD-10-CM

## 2023-05-16 DIAGNOSIS — E876 Hypokalemia: Secondary | ICD-10-CM | POA: Diagnosis present

## 2023-05-16 DIAGNOSIS — L0390 Cellulitis, unspecified: Secondary | ICD-10-CM

## 2023-05-16 DIAGNOSIS — L03114 Cellulitis of left upper limb: Secondary | ICD-10-CM

## 2023-05-16 DIAGNOSIS — M7989 Other specified soft tissue disorders: Secondary | ICD-10-CM | POA: Diagnosis not present

## 2023-05-16 DIAGNOSIS — I10 Essential (primary) hypertension: Secondary | ICD-10-CM

## 2023-05-16 DIAGNOSIS — I129 Hypertensive chronic kidney disease with stage 1 through stage 4 chronic kidney disease, or unspecified chronic kidney disease: Secondary | ICD-10-CM | POA: Diagnosis present

## 2023-05-16 DIAGNOSIS — N179 Acute kidney failure, unspecified: Secondary | ICD-10-CM | POA: Diagnosis not present

## 2023-05-16 DIAGNOSIS — F1410 Cocaine abuse, uncomplicated: Secondary | ICD-10-CM | POA: Diagnosis present

## 2023-05-16 DIAGNOSIS — N170 Acute kidney failure with tubular necrosis: Secondary | ICD-10-CM | POA: Diagnosis present

## 2023-05-16 DIAGNOSIS — Z794 Long term (current) use of insulin: Secondary | ICD-10-CM | POA: Diagnosis not present

## 2023-05-16 DIAGNOSIS — Z7951 Long term (current) use of inhaled steroids: Secondary | ICD-10-CM | POA: Diagnosis not present

## 2023-05-16 DIAGNOSIS — E1122 Type 2 diabetes mellitus with diabetic chronic kidney disease: Secondary | ICD-10-CM

## 2023-05-16 DIAGNOSIS — Z8249 Family history of ischemic heart disease and other diseases of the circulatory system: Secondary | ICD-10-CM | POA: Diagnosis not present

## 2023-05-16 DIAGNOSIS — F17210 Nicotine dependence, cigarettes, uncomplicated: Secondary | ICD-10-CM | POA: Diagnosis present

## 2023-05-16 DIAGNOSIS — F1910 Other psychoactive substance abuse, uncomplicated: Secondary | ICD-10-CM

## 2023-05-16 DIAGNOSIS — E119 Type 2 diabetes mellitus without complications: Secondary | ICD-10-CM

## 2023-05-16 DIAGNOSIS — Z7984 Long term (current) use of oral hypoglycemic drugs: Secondary | ICD-10-CM | POA: Diagnosis not present

## 2023-05-16 DIAGNOSIS — J45909 Unspecified asthma, uncomplicated: Secondary | ICD-10-CM | POA: Diagnosis present

## 2023-05-16 DIAGNOSIS — N189 Chronic kidney disease, unspecified: Secondary | ICD-10-CM

## 2023-05-16 DIAGNOSIS — E785 Hyperlipidemia, unspecified: Secondary | ICD-10-CM | POA: Diagnosis present

## 2023-05-16 LAB — BASIC METABOLIC PANEL
Anion gap: 13 (ref 5–15)
BUN: 55 mg/dL — ABNORMAL HIGH (ref 8–23)
CO2: 22 mmol/L (ref 22–32)
Calcium: 7.9 mg/dL — ABNORMAL LOW (ref 8.9–10.3)
Chloride: 97 mmol/L — ABNORMAL LOW (ref 98–111)
Creatinine, Ser: 1.87 mg/dL — ABNORMAL HIGH (ref 0.61–1.24)
GFR, Estimated: 39 mL/min — ABNORMAL LOW (ref >60)
Glucose, Bld: 228 mg/dL — ABNORMAL HIGH (ref 70–99)
Potassium: 2.9 mmol/L — ABNORMAL LOW (ref 3.5–5.1)
Sodium: 132 mmol/L — ABNORMAL LOW (ref 135–145)

## 2023-05-16 LAB — URINALYSIS, COMPLETE (UACMP) WITH MICROSCOPIC
Bacteria, UA: NONE SEEN
Bilirubin Urine: NEGATIVE
Glucose, UA: 50 mg/dL — AB
Ketones, ur: NEGATIVE mg/dL
Leukocytes,Ua: NEGATIVE
Nitrite: NEGATIVE
Protein, ur: >=300 mg/dL — AB
Specific Gravity, Urine: 1.017 (ref 1.005–1.030)
pH: 5 (ref 5.0–8.0)

## 2023-05-16 LAB — CBC
HCT: 32.7 % — ABNORMAL LOW (ref 39.0–52.0)
Hemoglobin: 11.1 g/dL — ABNORMAL LOW (ref 13.0–17.0)
MCH: 28.8 pg (ref 26.0–34.0)
MCHC: 33.9 g/dL (ref 30.0–36.0)
MCV: 84.7 fL (ref 80.0–100.0)
Platelets: 171 10*3/uL (ref 150–400)
RBC: 3.86 MIL/uL — ABNORMAL LOW (ref 4.22–5.81)
RDW: 13.9 % (ref 11.5–15.5)
WBC: 18.4 10*3/uL — ABNORMAL HIGH (ref 4.0–10.5)
nRBC: 0 % (ref 0.0–0.2)

## 2023-05-16 LAB — GLUCOSE, CAPILLARY
Glucose-Capillary: 119 mg/dL — ABNORMAL HIGH (ref 70–99)
Glucose-Capillary: 140 mg/dL — ABNORMAL HIGH (ref 70–99)
Glucose-Capillary: 171 mg/dL — ABNORMAL HIGH (ref 70–99)
Glucose-Capillary: 173 mg/dL — ABNORMAL HIGH (ref 70–99)
Glucose-Capillary: 219 mg/dL — ABNORMAL HIGH (ref 70–99)

## 2023-05-16 LAB — CULTURE, BLOOD (ROUTINE X 2)
Culture: NO GROWTH
Culture: NO GROWTH
Special Requests: ADEQUATE
Special Requests: ADEQUATE

## 2023-05-16 LAB — CBG MONITORING, ED: Glucose-Capillary: 207 mg/dL — ABNORMAL HIGH (ref 70–99)

## 2023-05-16 MED ORDER — ONDANSETRON HCL 4 MG PO TABS: 4.0000 mg | ORAL_TABLET | Freq: Four times a day (QID) | ORAL | Status: DC | PRN

## 2023-05-16 MED ORDER — ACETAMINOPHEN 650 MG RE SUPP: 650.0000 mg | Freq: Four times a day (QID) | RECTAL | Status: DC | PRN

## 2023-05-16 MED ORDER — IOHEXOL 350 MG/ML SOLN
75.0000 mL | Freq: Once | INTRAVENOUS | Status: AC | PRN
  Administered 2023-05-16: 75 mL via INTRAVENOUS

## 2023-05-16 MED ORDER — VANCOMYCIN HCL 1500 MG/300ML IV SOLN: 1500.0000 mg | INTRAVENOUS | Status: DC

## 2023-05-16 MED ORDER — SODIUM CHLORIDE 0.9 % IV SOLN: INTRAVENOUS | Status: DC

## 2023-05-16 MED ORDER — INSULIN ASPART 100 UNIT/ML IJ SOLN
0.0000 [IU] | INTRAMUSCULAR | Status: DC
  Administered 2023-05-16: 3 [IU] via SUBCUTANEOUS
  Administered 2023-05-16 (×2): 2 [IU] via SUBCUTANEOUS
  Administered 2023-05-16: 3 [IU] via SUBCUTANEOUS
  Administered 2023-05-16: 1 [IU] via SUBCUTANEOUS
  Administered 2023-05-16: 2 [IU] via SUBCUTANEOUS
  Administered 2023-05-17: 1 [IU] via SUBCUTANEOUS
  Filled 2023-05-16: qty 0.09

## 2023-05-16 MED ORDER — POTASSIUM CHLORIDE CRYS ER 20 MEQ PO TBCR
40.0000 meq | EXTENDED_RELEASE_TABLET | ORAL | Status: AC
  Administered 2023-05-16 (×3): 40 meq via ORAL
  Filled 2023-05-16 (×3): qty 2

## 2023-05-16 MED ORDER — SODIUM CHLORIDE 0.9 % IV SOLN
3.0000 g | Freq: Four times a day (QID) | INTRAVENOUS | Status: DC
  Administered 2023-05-16 – 2023-05-17 (×5): 3 g via INTRAVENOUS
  Filled 2023-05-16 (×5): qty 8

## 2023-05-16 MED ORDER — ACETAMINOPHEN 325 MG PO TABS
650.0000 mg | ORAL_TABLET | Freq: Four times a day (QID) | ORAL | Status: DC | PRN
  Administered 2023-05-16 (×2): 650 mg via ORAL
  Filled 2023-05-16 (×2): qty 2

## 2023-05-16 MED ORDER — ONDANSETRON HCL 4 MG/2ML IJ SOLN: 4.0000 mg | Freq: Four times a day (QID) | INTRAMUSCULAR | Status: DC | PRN

## 2023-05-16 NOTE — Assessment & Plan Note (Addendum)
-   continue home regimen  

## 2023-05-16 NOTE — H&P (Addendum)
History and Physical    Patient: Justin Snow ZDG:[id number] DOB: 09/07/1956 DOA: 05/15/2023 DOS: the patient was seen and examined on 05/16/2023 PCP: Pcp, No  Patient coming from: Home  Chief Complaint:  Chief Complaint  Patient presents with   Altered Mental Status   HPI: Justin Snow is a 67 y.o. male with medical history significant of DM2, HTN, homelessness.  Suspect recent substance abuse (see discussion in A/P below).  Pt recently arrested on a parole violation charge on 8/13.  Seen in ED 8/14 with reported fever of 100.7 and mental status change.  Work up in ED presumably not very impressive as pt ultimately discharged.  There is no mention on any EDP note or otherwise of any LUE rash, cellulitis, etc at that time.  Today pt brought in to ED from jail with severe swelling, erythema, and small ulcer development of his L forearm.  Intermittently painful but not severely painful per patient.  (Pain actually less than I would expect based on exam findings).  Pt not admitting to using any substances, just maintains his silence when asked about these.  This is fully understandable as he is currently in custody on an alleged parole violation.   Review of Systems: As mentioned in the history of present illness. All other systems reviewed and are negative. Past Medical History:  Diagnosis Date   Asthma    Diabetes mellitus without complication (HCC)    Hypertension    History reviewed. No pertinent surgical history. Social History:  reports that he has been smoking cigarettes. He has never used smokeless tobacco. He reports that he does not currently use alcohol. He reports that he does not currently use drugs.  No Known Allergies  Family History  Problem Relation Age of Onset   Heart disease Father     Prior to Admission medications   Medication Sig Start Date End Date Taking? Authorizing Provider  albuterol (VENTOLIN HFA) 108 (90 Base) MCG/ACT inhaler Inhale  into the lungs every 6 (six) hours as needed for wheezing or shortness of breath.    [provider]  amLODipine (NORVASC) 10 MG tablet Take 1 tablet (10 mg total) by mouth daily. 04/27/23   Amin, Ankit C, MD  atorvastatin (LIPITOR) 10 MG tablet Take 1 tablet (10 mg total) by mouth at bedtime. 04/27/23   Amin, Ankit C, MD  Blood Glucose Monitoring Suppl (BLOOD GLUCOSE MONITOR SYSTEM) w/Device KIT Use 3 (three) times daily. 04/27/23   Amin, Ankit C, MD  carvedilol (COREG) 6.25 MG tablet Take 1 tablet (6.25 mg total) by mouth 2 (two) times daily. 04/27/23   Amin, Ankit C, MD  DULoxetine (CYMBALTA) 30 MG capsule Take 1 capsule (30 mg total) by mouth 2 (two) times daily. 04/27/23   Miguel Rota, MD  fluticasone-salmeterol (ADVAIR HFA) 951-88 MCG/ACT inhaler Inhale 2 puffs into the lungs 2 (two) times daily.    [provider]  gabapentin (NEURONTIN) 400 MG capsule Take 400 mg by mouth 3 (three) times daily. 05/25/20   [provider]  Glucagon (GVOKE HYPOPEN 2-PACK) 1 MG/0.2ML SOAJ Inject 1 mg into the skin as needed for up to 2 doses (Severe low blood sugar). 04/27/23   Amin, Ankit C, MD  Glucose Blood (BLOOD GLUCOSE TEST STRIPS) STRP Use as directed to check blood sugar three times daily 04/27/23   Amin, Ankit C, MD  hydrochlorothiazide (HYDRODIURIL) 12.5 MG tablet Take 1 tablet (12.5 mg total) by mouth daily. 04/28/23   Amin, Ankit  C, MD  insulin aspart (NOVOLOG) 100 UNIT/ML FlexPen Inject 0-6 Units into the skin 3 (three) times daily with meals. Check Blood Glucose (BG) and inject per scale: BG <150= 0 unit; BG 150-200= 1 unit; BG 201-250= 2 unit; BG 251-300= 3 unit; BG 301-350= 4 unit; BG 351-400= 5 unit; BG >400= 6 unit and Call Primary Care. 04/27/23   Amin, Ankit C, MD  insulin glargine-yfgn (SEMGLEE, YFGN,) 100 UNIT/ML Pen Inject 15 Units into the skin daily. 04/27/23   Amin, Ankit C, MD  Insulin Pen Needle 31G X 8 MM MISC Use 3 (three) times daily. 04/27/23   Amin, Ankit C, MD   Insulin Syringe-Needle U-100 (INSULIN SYRINGE .5CC/30GX1/2") 30G X 1/2" 0.5 ML MISC Use as directed with insulin. 10/18/19   Mardella Layman, MD  Lancet Device MISC Use 3 (three) times daily. May dispense any manufacturer covered by patient's insurance 04/27/23   Amin, Ankit C, MD  lisinopril (ZESTRIL) 40 MG tablet Take 1 tablet (40 mg total) by mouth daily. 04/27/23   Amin, Ankit C, MD  metFORMIN (GLUCOPHAGE) 1000 MG tablet Take 1 tablet (1,000 mg total) by mouth 2 (two) times daily. 04/27/23 07/26/23  Miguel Rota, MD  omeprazole (PRILOSEC) 20 MG capsule Take 1 capsule (20 mg total) by mouth every morning. 04/27/23   Amin, Ankit C, MD  potassium chloride (MICRO-K) 10 MEQ CR capsule Take 10 mEq by mouth daily. 05/12/20   [provider]  TRUEplus Lancets 28G MISC Use 3 (three) times daily as directed to check blood sugar 04/27/23   Miguel Rota, MD    Physical Exam: Vitals:   05/15/23 2048 05/15/23 2200 05/15/23 2245  BP: 123/69 (!) 121/51   Pulse: 98 97 95  Resp: 16 (!) 22 11  Temp: 98.3 F (36.8 C)    TempSrc: Oral    SpO2: 99% 100% 99%  Weight: 99.8 kg    Height: 5\' 7"  (1.702 m)     Constitutional: NAD, calm, comfortable Respiratory: clear to auscultation bilaterally, no wheezing, no crackles. Normal respiratory effort. No accessory muscle use.  Cardiovascular: Regular rate and rhythm, no murmurs / rubs / gallops. No extremity edema. 2+ pedal pulses. No carotid bruits.  Abdomen: no tenderness, no masses palpated. No hepatosplenomegaly. Bowel sounds positive.  Skin:     Neurologic: CN 2-12 grossly intact. Sensation intact, DTR normal. Strength 5/5 in all 4.  Psychiatric: Alert and oriented x 3.  Data Reviewed:    Labs on Admission: I have personally reviewed following labs and imaging studies  CBC: Recent Labs  Lab 05/11/23 0840 05/15/23 2116  WBC 18.5* 20.3*  NEUTROABS 16.3* 17.4*  HGB 13.0 12.2*  HCT 39.2 35.9*  MCV 88.7 86.3  PLT 161 227   Basic Metabolic  Panel: Recent Labs  Lab 05/11/23 0840 05/15/23 2116  NA 131* 131*  K 3.4* 3.2*  CL 99 95*  CO2 22 25  GLUCOSE 296* 233*  BUN 30* 60*  CREATININE 1.83* 2.14*  CALCIUM 8.6* 8.2*   GFR: Estimated Creatinine Clearance: 37.2 mL/min (A) (by C-G formula based on SCr of 2.14 mg/dL (H)). Liver Function Tests: Recent Labs  Lab 05/11/23 0840 05/15/23 2116  AST 14* 28  ALT 13 21  ALKPHOS 123 123  BILITOT 0.8 0.7  PROT 6.9 6.4*  ALBUMIN 3.5 2.7*   No results for input(s): "LIPASE", "AMYLASE" in the last 168 hours. No results for input(s): "AMMONIA" in the last 168 hours. Coagulation Profile: No results for input(s): "INR", "  PROTIME" in the last 168 hours. Cardiac Enzymes: Recent Labs  Lab 05/15/23 2200  CKTOTAL 267   BNP (last 3 results) No results for input(s): "PROBNP" in the last 8760 hours. HbA1C: No results for input(s): "HGBA1C" in the last 72 hours. CBG: Recent Labs  Lab 05/11/23 0852 05/15/23 2138  GLUCAP 309* 251*   Lipid Profile: No results for input(s): "CHOL", "HDL", "LDLCALC", "TRIG", "CHOLHDL", "LDLDIRECT" in the last 72 hours. Thyroid Function Tests: No results for input(s): "TSH", "T4TOTAL", "FREET4", "T3FREE", "THYROIDAB" in the last 72 hours. Anemia Panel: No results for input(s): "VITAMINB12", "FOLATE", "FERRITIN", "TIBC", "IRON", "RETICCTPCT" in the last 72 hours. Urine analysis:    Component Value Date/Time   COLORURINE YELLOW 05/11/2023 1253   APPEARANCEUR CLEAR 05/11/2023 1253   LABSPEC 1.012 05/11/2023 1253   PHURINE 5.0 05/11/2023 1253   GLUCOSEU 150 (A) 05/11/2023 1253   HGBUR SMALL (A) 05/11/2023 1253   BILIRUBINUR NEGATIVE 05/11/2023 1253   KETONESUR NEGATIVE 05/11/2023 1253   PROTEINUR >=300 (A) 05/11/2023 1253   NITRITE NEGATIVE 05/11/2023 1253   LEUKOCYTESUR NEGATIVE 05/11/2023 1253    Radiological Exams on Admission: DG Forearm Left  Result Date: 05/15/2023 CLINICAL DATA:  Infection on arm with new onset confusion. Arm  swollen from shoulder to fingertips. EXAM: LEFT FOREARM - 2 VIEW; LEFT ELBOW - 2 VIEW COMPARISON:  None Available. FINDINGS: Diffuse soft tissue swelling about the elbow and forearm. No acute fracture or dislocation. No evidence of osteomyelitis. No elbow joint effusion. IMPRESSION: Diffuse soft tissue swelling of the left arm without acute osseous abnormality. Electronically Signed   By: Minerva Fester M.D.   On: 05/15/2023 21:41   DG Elbow 2 Views Left  Result Date: 05/15/2023 CLINICAL DATA:  Infection on arm with new onset confusion. Arm swollen from shoulder to fingertips. EXAM: LEFT FOREARM - 2 VIEW; LEFT ELBOW - 2 VIEW COMPARISON:  None Available. FINDINGS: Diffuse soft tissue swelling about the elbow and forearm. No acute fracture or dislocation. No evidence of osteomyelitis. No elbow joint effusion. IMPRESSION: Diffuse soft tissue swelling of the left arm without acute osseous abnormality. Electronically Signed   By: Minerva Fester M.D.   On: 05/15/2023 21:41    EKG: Independently reviewed.   Assessment and Plan: * Cellulitis of left forearm Rapidly developing erythema, edema, skin changes to LUE.  LUE is about 2x the size of the RUE (see photos above).  Presumably had little to no evidence of this when seen in ED a mere 4 days ago for fever of unclear source (no mention of the skin rash then despite being obvious today). No subcutaneous air. WBC up to 20k today. Continue empiric unasyn / vanc started by EDP Repeat CBC/BMP in AM EDP spoke with on call Dr. Victorino Dike for ortho: pt to be admitted [facility] to [facility], presumably he will have hand surgery see patient in consult Ill keep pt NPO after MN in case this ends up needing surgical intervention. Im concerned that he may have taken a substance prior to his arrest 5 days ago that was laced with Xylazine in which case this rash / early ulceration may continue to rapidly progress and require debridement.   Acute kidney injury superimposed on  chronic kidney disease (HCC) AKI on CKD, presumably AKI component is pre-renal / ATN in setting of cellulitis with recent systemic findings suggestive of sepsis. IVF Strict intake and output Repeat BMP in AM Check UA  Substance abuse (HCC) Strongly suspected ongoing substance abuse: Pt is understandably not  admitting to any substance use at this time: this is understandable as he is currently in custody on an alleged parole violation and LEO at bedside. UDS from 4 days ago positive for cocaine See arm cellulitis discussion above, high concern that this may be the start of a xylazine related skin ulcer / necrosis.  DM2 (diabetes mellitus, type 2) (HCC) SSI sensitive Q4H for the moment.  Homelessness Pt currently in custody.  Essential hypertension Med rec pending at this time.      Advance Care Planning:   Code Status: Full Code  Consults: Dr. Victorino Dike  Family Communication: No family in room  Severity of Illness: The appropriate patient status for this patient is INPATIENT. Inpatient status is judged to be reasonable and necessary in order to provide the required intensity of service to ensure the patient's safety. The patient's presenting symptoms, physical exam findings, and initial radiographic and laboratory data in the context of their chronic comorbidities is felt to place them at high risk for further clinical deterioration. Furthermore, it is not anticipated that the patient will be medically stable for discharge from the hospital within 2 midnights of admission.   * I certify that at the point of admission it is my clinical judgment that the patient will require inpatient hospital care spanning beyond 2 midnights from the point of admission due to high intensity of service, high risk for further deterioration and high frequency of surveillance required.*  Author: Hillary Bow., DO 05/16/2023 12:27 AM  For on call review [email or web address].

## 2023-05-16 NOTE — ED Notes (Signed)
[facility] called for transport. 

## 2023-05-16 NOTE — Consult Note (Cosign Needed)
Reason for Consult:Left arm infection Referring Physician: Lewie Chamber Time called: 0818 Time at bedside: 0906   Justin Snow is an 67 y.o. male.  HPI: Ignatius developed LUE cellulitis for unknown reasons. He was seen about 5d ago with FUO but no remarks on redness of the arm or pain from the pt. He was homeless at the time but had a parole violation and was arrested then brought back to the ED with arm pain/redness/swelling. He denies any cuts or unusual events surrounding this illness. There is some question of IV substance use. Pt is very somnolent and cannot answer more than yes/no questions or participate in exam. He is RHD.  Past Medical History:  Diagnosis Date   Asthma    Diabetes mellitus without complication (HCC)    Hypertension     History reviewed. No pertinent surgical history.  Family History  Problem Relation Age of Onset   Heart disease Father     Social History:  reports that he has been smoking cigarettes. He has never used smokeless tobacco. He reports that he does not currently use alcohol. He reports that he does not currently use drugs.  Allergies: No Known Allergies  Medications: I have reviewed the patient's current medications.  Results for orders placed or performed during the hospital encounter of 05/15/23 (from the past 48 hour(s))  CBC with Differential     Status: Abnormal   Collection Time: 05/15/23  9:16 PM  Result Value Ref Range   WBC 20.3 (H) 4.0 - 10.5 K/uL   RBC 4.16 (L) 4.22 - 5.81 MIL/uL   Hemoglobin 12.2 (L) 13.0 - 17.0 g/dL   HCT 14.7 (L) 82.9 - 56.2 %   MCV 86.3 80.0 - 100.0 fL   MCH 29.3 26.0 - 34.0 pg   MCHC 34.0 30.0 - 36.0 g/dL   RDW 13.0 86.5 - 78.4 %   Platelets 227 150 - 400 K/uL   nRBC 0.0 0.0 - 0.2 %   Neutrophils Relative % 86 %   Neutro Abs 17.4 (H) 1.7 - 7.7 K/uL   Lymphocytes Relative 5 %   Lymphs Abs 1.0 0.7 - 4.0 K/uL   Monocytes Relative 8 %   Monocytes Absolute 1.6 (H) 0.1 - 1.0 K/uL   Eosinophils  Relative 0 %   Eosinophils Absolute 0.0 0.0 - 0.5 K/uL   Basophils Relative 0 %   Basophils Absolute 0.1 0.0 - 0.1 K/uL   Immature Granulocytes 1 %   Abs Immature Granulocytes 0.22 (H) 0.00 - 0.07 K/uL    Comment: Performed at [facility], [address] [place], [place] [address]  Comprehensive metabolic panel     Status: Abnormal   Collection Time: 05/15/23  9:16 PM  Result Value Ref Range   Sodium 131 (L) 135 - 145 mmol/L   Potassium 3.2 (L) 3.5 - 5.1 mmol/L   Chloride 95 (L) 98 - 111 mmol/L   CO2 25 22 - 32 mmol/L   Glucose, Bld 233 (H) 70 - 99 mg/dL    Comment: Glucose reference range applies only to samples taken after fasting for at least 8 hours.   BUN 60 (H) 8 - 23 mg/dL   Creatinine, Ser 5.28 (H) 0.61 - 1.24 mg/dL   Calcium 8.2 (L) 8.9 - 10.3 mg/dL   Total Protein 6.4 (L) 6.5 - 8.1 g/dL   Albumin 2.7 (L) 3.5 - 5.0 g/dL   AST 28 15 - 41 U/L   ALT 21 0 - 44  U/L   Alkaline Phosphatase 123 38 - 126 U/L   Total Bilirubin 0.7 0.3 - 1.2 mg/dL   GFR, Estimated 33 (L) >60 mL/min    Comment: (NOTE) Calculated using the CKD-EPI Creatinine Equation (2021)    Anion gap 11 5 - 15    Comment: Performed at [facility], [address] [place], [place] [address]  CBG monitoring, ED     Status: Abnormal   Collection Time: 05/15/23  9:38 PM  Result Value Ref Range   Glucose-Capillary 251 (H) 70 - 99 mg/dL    Comment: Glucose reference range applies only to samples taken after fasting for at least 8 hours.   Comment 1 Notify RN   Culture, blood (routine x 2)     Status: None (Preliminary result)   Collection Time: 05/15/23  9:48 PM   Specimen: BLOOD  Result Value Ref Range   Specimen Description      BLOOD RIGHT ANTECUBITAL Performed at [facility], [address], [place], [place] [address]    Special Requests      BOTTLES DRAWN AEROBIC AND ANAEROBIC Blood Culture results may not be optimal due to an excessive volume of  blood received in culture bottles Performed at [facility], [address], [place], [place] [address]    Culture PENDING    Report Status PENDING   I-Stat Lactic Acid     Status: None   Collection Time: 05/15/23  9:55 PM  Result Value Ref Range   Lactic Acid, Venous 1.6 0.5 - 1.9 mmol/L  CK     Status: None   Collection Time: 05/15/23 10:00 PM  Result Value Ref Range   Total CK 267 49 - 397 U/L    Comment: Performed at [facility], [address] [place], [place] [address]  CBG monitoring, ED     Status: Abnormal   Collection Time: 05/16/23  1:19 AM  Result Value Ref Range   Glucose-Capillary 207 (H) 70 - 99 mg/dL    Comment: Glucose reference range applies only to samples taken after fasting for at least 8 hours.  Basic metabolic panel     Status: Abnormal   Collection Time: 05/16/23  3:32 AM  Result Value Ref Range   Sodium 132 (L) 135 - 145 mmol/L   Potassium 2.9 (L) 3.5 - 5.1 mmol/L   Chloride 97 (L) 98 - 111 mmol/L   CO2 22 22 - 32 mmol/L   Glucose, Bld 228 (H) 70 - 99 mg/dL    Comment: Glucose reference range applies only to samples taken after fasting for at least 8 hours.   BUN 55 (H) 8 - 23 mg/dL   Creatinine, Ser 9.56 (H) 0.61 - 1.24 mg/dL   Calcium 7.9 (L) 8.9 - 10.3 mg/dL   GFR, Estimated 39 (L) >60 mL/min    Comment: (NOTE) Calculated using the CKD-EPI Creatinine Equation (2021)    Anion gap 13 5 - 15    Comment: Performed at [facility], [address] [place], [place] [address]  CBC     Status: Abnormal   Collection Time: 05/16/23  3:32 AM  Result Value Ref Range   WBC 18.4 (H) 4.0 - 10.5 K/uL   RBC 3.86 (L) 4.22 - 5.81 MIL/uL   Hemoglobin 11.1 (L) 13.0 - 17.0 g/dL   HCT 43.3 (L) 29.5 - 18.8 %   MCV 84.7 80.0 - 100.0 fL   MCH 28.8 26.0 -  34.0 pg   MCHC 33.9 30.0 - 36.0 g/dL   RDW 40.1 02.7 - 25.3 %   Platelets 171 150 - 400 K/uL   nRBC 0.0 0.0 - 0.2 %    Comment: Performed at [facility], [address] [place], [place] [address]  Glucose, capillary     Status: Abnormal   Collection Time: 05/16/23  3:55 AM  Result Value Ref Range   Glucose-Capillary 219 (H) 70 - 99 mg/dL    Comment: Glucose reference range applies only to samples taken after fasting for at least 8 hours.  Urinalysis, Complete w Microscopic -Urine, Clean Catch     Status: Abnormal   Collection Time: 05/16/23  6:34 AM  Result Value Ref Range   Color, Urine YELLOW YELLOW   APPearance HAZY (A) CLEAR   Specific Gravity, Urine 1.017 1.005 - 1.030   pH 5.0 5.0 - 8.0   Glucose, UA 50 (A) NEGATIVE mg/dL   Hgb urine dipstick SMALL (A) NEGATIVE   Bilirubin Urine NEGATIVE NEGATIVE   Ketones, ur NEGATIVE NEGATIVE mg/dL   Protein, ur >=347 (A) NEGATIVE mg/dL   Nitrite NEGATIVE NEGATIVE   Leukocytes,Ua NEGATIVE NEGATIVE   RBC / HPF 0-5 0 - 5 RBC/hpf   WBC, UA 0-5 0 - 5 WBC/hpf   Bacteria, UA NONE SEEN NONE SEEN   Squamous Epithelial / HPF 0-5 0 - 5 /HPF   Mucus PRESENT    Hyaline Casts, UA PRESENT     Comment: Performed at [facility], [address], [place], [place] [address]    DG Forearm Left  Result Date: 05/15/2023 CLINICAL DATA:  Infection on arm with new onset confusion. Arm swollen from shoulder to fingertips. EXAM: LEFT FOREARM - 2 VIEW; LEFT ELBOW - 2 VIEW COMPARISON:  None Available. FINDINGS: Diffuse soft tissue swelling about the elbow and forearm. No acute fracture or dislocation. No evidence of osteomyelitis. No elbow joint effusion. IMPRESSION: Diffuse soft tissue swelling of the left arm without acute osseous abnormality. Electronically Signed   By: Minerva Fester M.D.   On: 05/15/2023 21:41   DG Elbow 2 Views Left  Result Date: 05/15/2023 CLINICAL DATA:  Infection on arm with new onset confusion. Arm swollen from shoulder to fingertips. EXAM: LEFT FOREARM - 2 VIEW; LEFT ELBOW - 2 VIEW COMPARISON:  None Available. FINDINGS: Diffuse soft tissue swelling about the elbow and forearm. No acute fracture or  dislocation. No evidence of osteomyelitis. No elbow joint effusion. IMPRESSION: Diffuse soft tissue swelling of the left arm without acute osseous abnormality. Electronically Signed   By: Minerva Fester M.D.   On: 05/15/2023 21:41    Review of Systems  Unable to perform ROS: Mental status change   Blood pressure (!) 134/49, pulse 79, temperature 98.2 F (36.8 C), resp. rate 18, height 5\' 7"  (1.702 m), weight 99.8 kg, SpO2 97%. Physical Exam Constitutional:      General: He is not in acute distress.    Appearance: He is well-developed. He is not diaphoretic.     Comments: Somnolent  HENT:     Head: Normocephalic and atraumatic.  Eyes:     General: No scleral icterus.       Right eye: No discharge.        Left eye: No discharge.     Conjunctiva/sclera: Conjunctivae normal.  Cardiovascular:     Rate and Rhythm: Normal rate and regular rhythm.  Pulmonary:     Effort: Pulmonary effort is normal.  No respiratory distress.  Musculoskeletal:     Cervical back: Normal range of motion.     Comments: Left shoulder, elbow, wrist, digits- no skin wounds, mod TTP prox FA, distal upper arm, erythema with broad induration, mod TTP, no fluctuance, no instability, no blocks to motion  Sens  Ax/R/M/U intact  Mot   Ax/ R/ PIN/ M/ AIN/ U could not assess, unsure if 2/2 MS or real  Rad 2+  Skin:    General: Skin is warm and dry.  Psychiatric:        Mood and Affect: Mood normal.        Behavior: Behavior normal.     Assessment/Plan: Left arm cellulitis -- Will get CT to r/o drainable fluid collection. If negative can continue to treat medically with IV abx. Will also get DVT study of arm though suspect this will be negative.    Freeman Caldron, PA-C Orthopedic Surgery [phone] 05/16/2023, 9:22 AM   Addendum:  Pt seen and examined.  Agree with note as documented above.  I reviewed the CT images and report.  There is no sign of abscess.  The signs of cellulitis are improving with abx.   Agree with discharge on abx.  F/u as needed.

## 2023-05-16 NOTE — Assessment & Plan Note (Addendum)
Pt is understandably not admitting to any substance use at this time (understandable as he is currently in custody on an alleged parole violation)

## 2023-05-16 NOTE — Assessment & Plan Note (Signed)
Pt currently in custody.

## 2023-05-16 NOTE — Progress Notes (Signed)
Left upper extremity venous study completed.   Preliminary results relayed to RN.  Please see CV Procedures for preliminary results.  Carollee Nussbaumer, RVT  2:14 PM 05/16/23

## 2023-05-16 NOTE — Progress Notes (Addendum)
Pharmacy Antibiotic Note  Justin Snow is a 67 y.o. male admitted on 05/15/2023 with LUE cellulitis.   Pharmacy has been consulted for Unasyn + Vancomycin dosing.  Scr acutely elevated (baseline ~1.4-1.5)  Plan: Unasyn 3gm IV q6h Vancomycin 1500mg  IV q48h to target AUC 400-550.  Estimated AUC on this regimen = 500. Monitor renal function and cx data   Height: 5\' 7"  (170.2 cm) Weight: 99.8 kg (220 lb) IBW/kg (Calculated) : 66.1  Temp (24hrs), Avg:98.3 F (36.8 C), Min:98.3 F (36.8 C), Max:98.3 F (36.8 C)  Recent Labs  Lab 05/11/23 0840 05/11/23 0910 05/15/23 2116 05/15/23 2155  WBC 18.5*  --  20.3*  --   CREATININE 1.83*  --  2.14*  --   LATICACIDVEN  --  1.8  --  1.6    Estimated Creatinine Clearance: 37.2 mL/min (A) (by C-G formula based on SCr of 2.14 mg/dL (H)).    No Known Allergies  Antimicrobials this admission: 8/18 Unasyn >>  8/18 Vancomycin >>   Dose adjustments this admission:  Microbiology results: 8/18 BCx:   Thank you for allowing pharmacy to be a part of this patient's care.  Junita Push PharmD 05/16/2023 12:04 AM

## 2023-05-16 NOTE — Assessment & Plan Note (Addendum)
No prior/recent labs for comparison other than from 2021 - baseline creat appears around 1.3 - 1.5; GFR ~ 50  - creat 2.14 on admission and has been up/down over past week; for now suspicion is pre-renal given homelessness and poor intake, though has been arrested past few days however - very lethargic however and likely hasn't eaten well recently - continue NS - trend BMP

## 2023-05-16 NOTE — Inpatient Diabetes Management (Signed)
Inpatient Diabetes Program Recommendations  AACE/ADA: New Consensus Statement on Inpatient Glycemic Control (2015)  Target Ranges:  Prepandial:   less than 140 mg/dL      Peak postprandial:   less than 180 mg/dL (1-2 hours)      Critically ill patients:  140 - 180 mg/dL   Lab Results  Component Value Date   GLUCAP 219 (H) 05/16/2023   HGBA1C 7.8 (H) 04/24/2023    Review of Glycemic Control  Latest Reference Range & Units 05/15/23 21:38 05/16/23 01:19 05/16/23 03:55  Glucose-Capillary 70 - 99 mg/dL 784 (H) 696 (H) 295 (H)   Diabetes history: DM 2 Outpatient Diabetes medications: Metformin 1000 mg bid, Novolog 0-6 units tid starting at 151, Semglee 15 units Daily Current orders for Inpatient glycemic control:  Novolog 0-9 units Q4  A1c 7.8% on 7/28  Inpatient Diabetes Program Recommendations:    Note: pt on basal insulin at home, glucose trends in the 200 range on Novolog sensitive correction scale.  -   Consider starting Semglee 5 units Q24 hours  Thanks,  Christena Deem RN, MSN, BC-ADM Inpatient Diabetes Coordinator Team Pager [phone] (8a-5p)

## 2023-05-16 NOTE — Assessment & Plan Note (Addendum)
-   okay to resume home regimen but would hold semglee if appetite still not improve and can resume once glucose levels consistently > 150

## 2023-05-16 NOTE — Progress Notes (Signed)
Progress Note    Justin Snow   ZOX:[id number]  DOB: 1956/02/02  DOA: 05/15/2023     0 PCP: Pcp, No  Initial CC: fever, left arm infection  Hospital Course: Justin Snow is a 67 yo male with PMH CVA, homelessness, HTN, HLD, DMII, tobacco and alcohol use. 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  He was treated for hypertensive emergency during that hospitalization and was stabilized with Cleviprex.  He was evaluated by neurology/stroke team and had improvement in his mobility and had wished for discharge home.  He was recommended to continue with blood pressure control at discharge and ongoing risk factor modification control.  He has been seen in the ER multiple times since discharge for weakness and hypotension/syncope. He then presented to the ER for this admission due to development of a fever.  He was brought in by [facility] department due to concern for infection. Appears patient arrested on parole violation charge on 8/13.   X-rays were notable for soft tissue swelling in the left arm.  Patient denied any IV drug use although law enforcement were present. WBC 20.3, lactic normal.   He was started on vancomycin and Unasyn.  Orthopedic surgery was consulted on admission as well.  Interval History:  No events overnight. Officer present this morning. Patient handcuffed to bed via RUE and RLE.  He's sleepy and awakens but falls back asleep and does not want to engage in conversation much.   Assessment and Plan: * Cellulitis of left forearm - Rapidly developed erythema, edema, skin changes to LUE.  LUE is about 2x the size of the RUE.  Presumably had little to no evidence of this when seen in ED on 8/14 - tachycardia, brief tachypnea, leukocytosis; cellulitis L forearm - continue vanc and unasyn - follow up orthopedic surgery evaluation  - follow up CT LUE - duplex negative for DVT  Acute renal  failure superimposed on stage 3a chronic kidney disease (HCC) No prior/recent labs for comparison other than from 2021 - baseline creat appears around 1.3 - 1.5; GFR ~ 50  - creat 2.14 on admission and has been up/down over past week; for now suspicion is pre-renal given homelessness and poor intake, though has been arrested past few days however - very lethargic however and likely hasn't eaten well recently - continue NS - trend BMP  Substance abuse (HCC) - Strongly suspected ongoing substance abuse - still lethargic and drowsy today; not wanting to engage in conversation either and very apathetic to situation and infection -Last UDS on 05/11/2023 positive for cocaine   Essential hypertension - EP controlled, watch off medication for now  Homelessness Pt currently in custody.  DM2 (diabetes mellitus, type 2) (HCC) - continue q4h SSI for now  History of CVA (cerebrovascular accident) - per MRI 04/24/23: "Small acute Left thalamic hemorrhage  (4 mm, no edema or mass effect) superimposed on evidence of extensive chronic microhemorrhages in the deep gray nuclei. Positive also for a small acute white matter lacunar infarct in the right centrum semiovale." - BP control encouraged at discharge  Sepsis due to cellulitis (HCC)-resolved as of 05/16/2023 - see cellulitis workup  Old records reviewed in assessment of this patient  Antimicrobials: Unasyn 05/15/2023 >> current Vancomycin 05/15/2023 >> current  DVT prophylaxis:  SCDs Start: 05/16/23 0020   Code Status:   Code Status: Full Code  Mobility Assessment (Last 72 Hours)     Mobility Assessment  Row Name 05/16/23 0800 05/16/23 0234         Does patient have an order for bedrest or is patient medically unstable No - Continue assessment No - Continue assessment      What is the highest level of mobility based on the progressive mobility assessment? Level 4 (Walks with assist in room) - Balance while marching in place and  cannot step forward and back - Complete Level 4 (Walks with assist in room) - Balance while marching in place and cannot step forward and back - Complete               Barriers to discharge: none Disposition Plan:  Jail Status is: Inpt  Objective: Blood pressure (!) 134/49, pulse 79, temperature 98.2 F (36.8 C), resp. rate 18, height 5\' 7"  (1.702 m), weight 99.8 kg, SpO2 97%.  Examination:  Physical Exam Constitutional:      Comments: Adult man very lethargic falling asleep but in NAD  HENT:     Head: Normocephalic and atraumatic.     Mouth/Throat:     Mouth: Mucous membranes are dry.  Eyes:     Extraocular Movements: Extraocular movements intact.  Cardiovascular:     Rate and Rhythm: Normal rate and regular rhythm.  Pulmonary:     Effort: Pulmonary effort is normal. No respiratory distress.     Breath sounds: Normal breath sounds. No wheezing.  Abdominal:     General: Bowel sounds are normal. There is no distension.     Palpations: Abdomen is soft.     Tenderness: There is no abdominal tenderness.  Musculoskeletal:     Cervical back: Normal range of motion and neck supple.     Comments: LUE noted with clear bullae along distal forearm and generalized edema from upper arm to hand. Tenderness with passive ROM in elbow and generalized TTP throughout soft tissues  Skin:    General: Skin is warm and dry.  Neurological:     Comments: Too lethargic to fully assess but moves all 4 extremities and follows commands       Consultants:  Orthopedic surgery  Procedures:    Data Reviewed: Results for orders placed or performed during the hospital encounter of 05/15/23 (from the past 24 hour(s))  CBC with Differential     Status: Abnormal   Collection Time: 05/15/23  9:16 PM  Result Value Ref Range   WBC 20.3 (H) 4.0 - 10.5 K/uL   RBC 4.16 (L) 4.22 - 5.81 MIL/uL   Hemoglobin 12.2 (L) 13.0 - 17.0 g/dL   HCT 65.7 (L) 84.6 - 96.2 %   MCV 86.3 80.0 - 100.0 fL   MCH 29.3 26.0  - 34.0 pg   MCHC 34.0 30.0 - 36.0 g/dL   RDW 95.2 84.1 - 32.4 %   Platelets 227 150 - 400 K/uL   nRBC 0.0 0.0 - 0.2 %   Neutrophils Relative % 86 %   Neutro Abs 17.4 (H) 1.7 - 7.7 K/uL   Lymphocytes Relative 5 %   Lymphs Abs 1.0 0.7 - 4.0 K/uL   Monocytes Relative 8 %   Monocytes Absolute 1.6 (H) 0.1 - 1.0 K/uL   Eosinophils Relative 0 %   Eosinophils Absolute 0.0 0.0 - 0.5 K/uL   Basophils Relative 0 %   Basophils Absolute 0.1 0.0 - 0.1 K/uL   Immature Granulocytes 1 %   Abs Immature Granulocytes 0.22 (H) 0.00 - 0.07 K/uL  Comprehensive metabolic panel     Status: Abnormal  Collection Time: 05/15/23  9:16 PM  Result Value Ref Range   Sodium 131 (L) 135 - 145 mmol/L   Potassium 3.2 (L) 3.5 - 5.1 mmol/L   Chloride 95 (L) 98 - 111 mmol/L   CO2 25 22 - 32 mmol/L   Glucose, Bld 233 (H) 70 - 99 mg/dL   BUN 60 (H) 8 - 23 mg/dL   Creatinine, Ser 1.61 (H) 0.61 - 1.24 mg/dL   Calcium 8.2 (L) 8.9 - 10.3 mg/dL   Total Protein 6.4 (L) 6.5 - 8.1 g/dL   Albumin 2.7 (L) 3.5 - 5.0 g/dL   AST 28 15 - 41 U/L   ALT 21 0 - 44 U/L   Alkaline Phosphatase 123 38 - 126 U/L   Total Bilirubin 0.7 0.3 - 1.2 mg/dL   GFR, Estimated 33 (L) >60 mL/min   Anion gap 11 5 - 15  CBG monitoring, ED     Status: Abnormal   Collection Time: 05/15/23  9:38 PM  Result Value Ref Range   Glucose-Capillary 251 (H) 70 - 99 mg/dL   Comment 1 Notify RN   Culture, blood (routine x 2)     Status: None (Preliminary result)   Collection Time: 05/15/23  9:48 PM   Specimen: BLOOD  Result Value Ref Range   Specimen Description      BLOOD RIGHT ANTECUBITAL Performed at [facility], [address], [place], [place] [address]    Special Requests      BOTTLES DRAWN AEROBIC AND ANAEROBIC Blood Culture results may not be optimal due to an excessive volume of blood received in culture bottles Performed at [facility], [address], [place], [place] [address]    Culture PENDING    Report Status  PENDING   I-Stat Lactic Acid     Status: None   Collection Time: 05/15/23  9:55 PM  Result Value Ref Range   Lactic Acid, Venous 1.6 0.5 - 1.9 mmol/L  CK     Status: None   Collection Time: 05/15/23 10:00 PM  Result Value Ref Range   Total CK 267 49 - 397 U/L  CBG monitoring, ED     Status: Abnormal   Collection Time: 05/16/23  1:19 AM  Result Value Ref Range   Glucose-Capillary 207 (H) 70 - 99 mg/dL  Basic metabolic panel     Status: Abnormal   Collection Time: 05/16/23  3:32 AM  Result Value Ref Range   Sodium 132 (L) 135 - 145 mmol/L   Potassium 2.9 (L) 3.5 - 5.1 mmol/L   Chloride 97 (L) 98 - 111 mmol/L   CO2 22 22 - 32 mmol/L   Glucose, Bld 228 (H) 70 - 99 mg/dL   BUN 55 (H) 8 - 23 mg/dL   Creatinine, Ser 1.19 (H) 0.61 - 1.24 mg/dL   Calcium 7.9 (L) 8.9 - 10.3 mg/dL   GFR, Estimated 39 (L) >60 mL/min   Anion gap 13 5 - 15  CBC     Status: Abnormal   Collection Time: 05/16/23  3:32 AM  Result Value Ref Range   WBC 18.4 (H) 4.0 - 10.5 K/uL   RBC 3.86 (L) 4.22 - 5.81 MIL/uL   Hemoglobin 11.1 (L) 13.0 - 17.0 g/dL   HCT 14.7 (L) 82.9 - 56.2 %   MCV 84.7 80.0 - 100.0 fL   MCH 28.8 26.0 - 34.0 pg   MCHC 33.9 30.0 - 36.0 g/dL   RDW 13.0 86.5 -  15.5 %   Platelets 171 150 - 400 K/uL   nRBC 0.0 0.0 - 0.2 %  Glucose, capillary     Status: Abnormal   Collection Time: 05/16/23  3:55 AM  Result Value Ref Range   Glucose-Capillary 219 (H) 70 - 99 mg/dL  Urinalysis, Complete w Microscopic -Urine, Clean Catch     Status: Abnormal   Collection Time: 05/16/23  6:34 AM  Result Value Ref Range   Color, Urine YELLOW YELLOW   APPearance HAZY (A) CLEAR   Specific Gravity, Urine 1.017 1.005 - 1.030   pH 5.0 5.0 - 8.0   Glucose, UA 50 (A) NEGATIVE mg/dL   Hgb urine dipstick SMALL (A) NEGATIVE   Bilirubin Urine NEGATIVE NEGATIVE   Ketones, ur NEGATIVE NEGATIVE mg/dL   Protein, ur >=161 (A) NEGATIVE mg/dL   Nitrite NEGATIVE NEGATIVE   Leukocytes,Ua NEGATIVE NEGATIVE   RBC / HPF 0-5 0  - 5 RBC/hpf   WBC, UA 0-5 0 - 5 WBC/hpf   Bacteria, UA NONE SEEN NONE SEEN   Squamous Epithelial / HPF 0-5 0 - 5 /HPF   Mucus PRESENT    Hyaline Casts, UA PRESENT   Glucose, capillary     Status: Abnormal   Collection Time: 05/16/23 11:59 AM  Result Value Ref Range   Glucose-Capillary 173 (H) 70 - 99 mg/dL    I have reviewed pertinent nursing notes, vitals, labs, and images as necessary. I have ordered labwork to follow up on as indicated.  I have reviewed the last notes from staff over past 24 hours. I have discussed patient's care plan and test results with nursing staff, CM/SW, and other staff as appropriate.  Time spent: Greater than 50% of the 55 minute visit was spent in counseling/coordination of care for the patient as laid out in the A&P.   LOS: 0 days   Lewie Chamber, MD [facility] 05/16/2023, 2:59 PM

## 2023-05-16 NOTE — Assessment & Plan Note (Signed)
-   per MRI 04/24/23: "Small acute Left thalamic hemorrhage  (4 mm, no edema or mass effect) superimposed on evidence of extensive chronic microhemorrhages in the deep gray nuclei. Positive also for a small acute white matter lacunar infarct in the right centrum semiovale." - BP control encouraged at discharge

## 2023-05-16 NOTE — Hospital Course (Signed)
Justin Snow is a 67 yo male with PMH CVA, homelessness, HTN, HLD, DMII, tobacco and alcohol use. 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  He was treated for hypertensive emergency during that hospitalization and was stabilized with Cleviprex.  He was evaluated by neurology/stroke team and had improvement in his mobility and had wished for discharge home.  He was recommended to continue with blood pressure control at discharge and ongoing risk factor modification control.  He has been seen in the ER multiple times since discharge for weakness and hypotension/syncope. He then presented to the ER for this admission due to development of a fever.  He was brought in by [facility] department due to concern for infection. Appears patient arrested on parole violation charge on 8/13.   X-rays were notable for soft tissue swelling in the left arm.  Patient denied any IV drug use although law enforcement were present. WBC 20.3, lactic normal.   He was started on vancomycin and Unasyn.  Orthopedic surgery was consulted on admission as well.

## 2023-05-16 NOTE — Assessment & Plan Note (Signed)
-   see cellulitis workup

## 2023-05-16 NOTE — Assessment & Plan Note (Addendum)
-   Rapidly developed erythema, edema, skin changes to LUE.  LUE is about 2x the size of the RUE.  Presumably had little to no evidence of this when seen in ED on 8/14 - tachycardia, brief tachypnea, leukocytosis; cellulitis L forearm - s/p vanc and unasyn while inpatient - transition to augmentin and doxy to complete more aggressive course - CT LUE negative for abscess; consistent with severe cellulitis; small joint effusion but adequate ROM in joint; patient evaluated by orthopedic surgery as well and cleared - duplex negative for DVT

## 2023-05-17 ENCOUNTER — Other Ambulatory Visit (HOSPITAL_COMMUNITY): Payer: Self-pay

## 2023-05-17 DIAGNOSIS — F1910 Other psychoactive substance abuse, uncomplicated: Secondary | ICD-10-CM | POA: Diagnosis not present

## 2023-05-17 DIAGNOSIS — L03114 Cellulitis of left upper limb: Secondary | ICD-10-CM | POA: Diagnosis not present

## 2023-05-17 DIAGNOSIS — N179 Acute kidney failure, unspecified: Secondary | ICD-10-CM | POA: Diagnosis not present

## 2023-05-17 DIAGNOSIS — N1831 Chronic kidney disease, stage 3a: Secondary | ICD-10-CM | POA: Diagnosis not present

## 2023-05-17 LAB — BASIC METABOLIC PANEL
Anion gap: 10 (ref 5–15)
BUN: 42 mg/dL — ABNORMAL HIGH (ref 8–23)
CO2: 20 mmol/L — ABNORMAL LOW (ref 22–32)
Calcium: 8 mg/dL — ABNORMAL LOW (ref 8.9–10.3)
Chloride: 105 mmol/L (ref 98–111)
Creatinine, Ser: 1.5 mg/dL — ABNORMAL HIGH (ref 0.61–1.24)
GFR, Estimated: 50 mL/min — ABNORMAL LOW (ref >60)
Glucose, Bld: 123 mg/dL — ABNORMAL HIGH (ref 70–99)
Potassium: 3.6 mmol/L (ref 3.5–5.1)
Sodium: 135 mmol/L (ref 135–145)

## 2023-05-17 LAB — CBC WITH DIFFERENTIAL/PLATELET
Abs Immature Granulocytes: 0.17 10*3/uL — ABNORMAL HIGH (ref 0.00–0.07)
Basophils Absolute: 0 10*3/uL (ref 0.0–0.1)
Basophils Relative: 0 %
Eosinophils Absolute: 0.2 10*3/uL (ref 0.0–0.5)
Eosinophils Relative: 1 %
HCT: 33.2 % — ABNORMAL LOW (ref 39.0–52.0)
Hemoglobin: 11.1 g/dL — ABNORMAL LOW (ref 13.0–17.0)
Immature Granulocytes: 1 %
Lymphocytes Relative: 8 %
Lymphs Abs: 1.2 10*3/uL (ref 0.7–4.0)
MCH: 28.8 pg (ref 26.0–34.0)
MCHC: 33.4 g/dL (ref 30.0–36.0)
MCV: 86 fL (ref 80.0–100.0)
Monocytes Absolute: 1.1 10*3/uL — ABNORMAL HIGH (ref 0.1–1.0)
Monocytes Relative: 8 %
Neutro Abs: 12.1 10*3/uL — ABNORMAL HIGH (ref 1.7–7.7)
Neutrophils Relative %: 82 %
Platelets: 212 10*3/uL (ref 150–400)
RBC: 3.86 MIL/uL — ABNORMAL LOW (ref 4.22–5.81)
RDW: 14 % (ref 11.5–15.5)
WBC: 14.8 10*3/uL — ABNORMAL HIGH (ref 4.0–10.5)
nRBC: 0 % (ref 0.0–0.2)

## 2023-05-17 LAB — GLUCOSE, CAPILLARY
Glucose-Capillary: 144 mg/dL — ABNORMAL HIGH (ref 70–99)
Glucose-Capillary: 112 mg/dL — ABNORMAL HIGH (ref 70–99)
Glucose-Capillary: 161 mg/dL — ABNORMAL HIGH (ref 70–99)

## 2023-05-17 LAB — MAGNESIUM: Magnesium: 1.4 mg/dL — ABNORMAL LOW (ref 1.7–2.4)

## 2023-05-17 MED ORDER — POTASSIUM CHLORIDE CRYS ER 20 MEQ PO TBCR
40.0000 meq | EXTENDED_RELEASE_TABLET | Freq: Once | ORAL | Status: AC
  Administered 2023-05-17: 40 meq via ORAL
  Filled 2023-05-17: qty 2

## 2023-05-17 MED ORDER — MAGNESIUM SULFATE 4 GM/100ML IV SOLN
4.0000 g | Freq: Once | INTRAVENOUS | Status: AC
  Administered 2023-05-17: 4 g via INTRAVENOUS
  Filled 2023-05-17: qty 100

## 2023-05-17 MED ORDER — AMOXICILLIN-POT CLAVULANATE 875-125 MG PO TABS
1.0000 | ORAL_TABLET | Freq: Two times a day (BID) | ORAL | Status: DC
  Administered 2023-05-17: 1 via ORAL
  Filled 2023-05-17: qty 1

## 2023-05-17 MED ORDER — DOXYCYCLINE HYCLATE 100 MG PO TABS
100.0000 mg | ORAL_TABLET | Freq: Two times a day (BID) | ORAL | 0 refills | Status: DC
  Filled 2023-05-17: qty 14, 7d supply, fill #0

## 2023-05-17 MED ORDER — AMOXICILLIN-POT CLAVULANATE 875-125 MG PO TABS: 1.0000 | ORAL_TABLET | Freq: Two times a day (BID) | ORAL | 0 refills | Status: DC

## 2023-05-17 MED ORDER — VANCOMYCIN HCL IN DEXTROSE 1-5 GM/200ML-% IV SOLN
1000.0000 mg | INTRAVENOUS | Status: DC
  Administered 2023-05-17: 1000 mg via INTRAVENOUS
  Filled 2023-05-17: qty 200

## 2023-05-17 MED ORDER — DOXYCYCLINE HYCLATE 100 MG PO TABS
100.0000 mg | ORAL_TABLET | Freq: Two times a day (BID) | ORAL | Status: DC
  Administered 2023-05-17: 100 mg via ORAL
  Filled 2023-05-17: qty 1

## 2023-05-17 MED ORDER — DOXYCYCLINE HYCLATE 100 MG PO TABS: 100.0000 mg | ORAL_TABLET | Freq: Two times a day (BID) | ORAL | 0 refills | Status: DC

## 2023-05-17 MED ORDER — AMOXICILLIN-POT CLAVULANATE 875-125 MG PO TABS
1.0000 | ORAL_TABLET | Freq: Two times a day (BID) | ORAL | 0 refills | Status: DC
  Filled 2023-05-17: qty 14, 7d supply, fill #0

## 2023-05-17 NOTE — Progress Notes (Signed)
Patient ID: Justin Snow, male   DOB: 12-12-1955, 67 y.o.   MRN: [id number]  No e/o abscess on CT, thus no surgical indication for I&D. Continue medical management of cellulitis. Orthopedics will sign off.    Freeman Caldron, PA-C Orthopedic Surgery [phone]

## 2023-05-17 NOTE — Discharge Instructions (Signed)
Hold Semglee until appetite improves and/or glucose levels consistently above ~150

## 2023-05-17 NOTE — Discharge Summary (Signed)
Physician Discharge Summary   Justin Snow:[id number] DOB: 05-31-1956 DOA: 05/15/2023  PCP: Oneita Hurt, No  Admit date: 05/15/2023 Discharge date: 05/17/2023  Admitted From: [address] Disposition:  Jail Discharging physician: Lewie Chamber, MD Barriers to discharge: none  Recommendations at discharge: Continue light compression to left upper extremity; if worsening pain/redness in elbow joint may need repeat imaging to rule out septic arthritis  Repeat BMP within 1-2 weeks Encourage oral intake  Discharge Condition: stable CODE STATUS: Full Diet recommendation:  Diet Orders (From admission, onward)     Start     Ordered   05/17/23 0930  Diet regular Fluid consistency: Thin  Diet effective now       Question:  Fluid consistency:  Answer:  Thin   05/17/23 0929   05/17/23 0000  Diet general        05/17/23 1110            Hospital Course: Justin Snow is a 67 yo male with PMH CVA, homelessness, HTN, HLD, DMII, tobacco and alcohol use. 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  He was treated for hypertensive emergency during that hospitalization and was stabilized with Cleviprex.  He was evaluated by neurology/stroke team and had improvement in his mobility and had wished for discharge home.  He was recommended to continue with blood pressure control at discharge and ongoing risk factor modification control.  He has been seen in the ER multiple times since discharge for weakness and hypotension/syncope. He then presented to the ER for this admission due to development of a fever.  He was brought in by [facility] department due to concern for infection. Appears patient arrested on parole violation charge on 8/13.   X-rays were notable for soft tissue swelling in the left arm.  Patient denied any IV drug use although law enforcement were present. WBC 20.3, lactic normal.   He was started on vancomycin and  Unasyn.  Orthopedic surgery was consulted on admission as well.  Assessment and Plan: * Cellulitis of left forearm - Rapidly developed erythema, edema, skin changes to LUE.  LUE is about 2x the size of the RUE.  Presumably had little to no evidence of this when seen in ED on 8/14 - tachycardia, brief tachypnea, leukocytosis; cellulitis L forearm - s/p vanc and unasyn while inpatient - transition to augmentin and doxy to complete more aggressive course - CT LUE negative for abscess; consistent with severe cellulitis; small joint effusion but adequate ROM in joint; patient evaluated by orthopedic surgery as well and cleared - duplex negative for DVT  Acute renal failure superimposed on stage 3a chronic kidney disease (HCC) No prior/recent labs for comparison other than from 2021 - baseline creat appears around 1.3 - 1.5; GFR ~ 50  - creat 2.14 on admission and has been up/down over past week; for now suspicion is pre-renal given homelessness and poor intake - very lethargic however and likely hasn't eaten well recently - s/p NS - creat has improved to 1.5 at discharge - continue encouraging good intake  Substance abuse (HCC) - Strongly suspected ongoing substance abuse - still lethargic and drowsy today; not wanting to engage in conversation either and very apathetic to situation and infection -Last UDS on 05/11/2023 positive for cocaine   Essential hypertension - continue home regimen  Homelessness Pt currently in custody.  DM2 (diabetes mellitus, type 2) (HCC) - okay to resume home regimen but would hold semglee  if appetite still not improve and can resume once glucose levels consistently > 150  History of CVA (cerebrovascular accident) - per MRI 04/24/23: "Small acute Left thalamic hemorrhage  (4 mm, no edema or mass effect) superimposed on evidence of extensive chronic microhemorrhages in the deep gray nuclei. Positive also for a small acute white matter lacunar infarct in the  right centrum semiovale." - BP control encouraged at discharge  Sepsis due to cellulitis (HCC)-resolved as of 05/16/2023 - see cellulitis workup   Principal Diagnosis: Cellulitis of left forearm  Discharge Diagnoses: Active Hospital Problems   Diagnosis Date Noted   Cellulitis of left forearm 05/16/2023    Priority: 1.   Acute renal failure superimposed on stage 3a chronic kidney disease (HCC) 05/16/2023    Priority: 2.   Substance abuse (HCC) 05/16/2023    Priority: 3.   Essential hypertension 06/27/2020    Priority: 4.   Homelessness 05/16/2023    Priority: 5.   DM2 (diabetes mellitus, type 2) (HCC) 05/16/2023    Priority: 6.   History of CVA (cerebrovascular accident) 05/16/2023    Resolved Hospital Problems   Diagnosis Date Noted Date Resolved   Sepsis due to cellulitis [facility]) 05/16/2023 05/16/2023     Discharge Instructions     Diet general   Complete by: As directed    Increase activity slowly   Complete by: As directed       Allergies as of 05/17/2023   No Known Allergies      Medication List     TAKE these medications    albuterol 108 (90 Base) MCG/ACT inhaler Commonly known as: VENTOLIN HFA Inhale into the lungs every 6 (six) hours as needed for wheezing or shortness of breath.   amLODipine 10 MG tablet Commonly known as: NORVASC Take 1 tablet (10 mg total) by mouth daily.   amoxicillin-clavulanate 875-125 MG tablet Commonly known as: AUGMENTIN Take 1 tablet by mouth every 12 (twelve) hours for 7 days.   atorvastatin 10 MG tablet Commonly known as: LIPITOR Take 1 tablet (10 mg total) by mouth at bedtime.   Blood Glucose Monitor System w/Device Kit Use 3 (three) times daily.   carvedilol 6.25 MG tablet Commonly known as: COREG Take 1 tablet (6.25 mg total) by mouth 2 (two) times daily.   doxycycline 100 MG tablet Commonly known as: VIBRA-TABS Take 1 tablet (100 mg total) by mouth every 12 (twelve) hours for 7 days.   DULoxetine 30 MG  capsule Commonly known as: CYMBALTA Take 1 capsule (30 mg total) by mouth 2 (two) times daily.   fluticasone-salmeterol 115-21 MCG/ACT inhaler Commonly known as: ADVAIR HFA Inhale 2 puffs into the lungs 2 (two) times daily.   gabapentin 400 MG capsule Commonly known as: NEURONTIN Take 400 mg by mouth 3 (three) times daily.   Gvoke HypoPen 2-Pack 1 MG/0.2ML Soaj Generic drug: Glucagon Inject 1 mg into the skin as needed for up to 2 doses (Severe low blood sugar).   hydrochlorothiazide 12.5 MG tablet Commonly known as: HYDRODIURIL Take 1 tablet (12.5 mg total) by mouth daily.   insulin glargine-yfgn 100 UNIT/ML Pen Commonly known as: Semglee (yfgn) Inject 15 Units into the skin daily.   INSULIN SYRINGE .5CC/30GX1/2" 30G X 1/2" 0.5 ML Misc Use as directed with insulin.   lisinopril 40 MG tablet Commonly known as: ZESTRIL Take 1 tablet (40 mg total) by mouth daily.   metFORMIN 1000 MG tablet Commonly known as: GLUCOPHAGE Take 1 tablet (1,000 mg total) by mouth  2 (two) times daily.   NovoLOG FlexPen 100 UNIT/ML FlexPen Generic drug: insulin aspart Inject 0-6 Units into the skin 3 (three) times daily with meals. Check Blood Glucose (BG) and inject per scale: BG <150= 0 unit; BG 150-200= 1 unit; BG 201-250= 2 unit; BG 251-300= 3 unit; BG 301-350= 4 unit; BG 351-400= 5 unit; BG >400= 6 unit and Call Primary Care.   omeprazole 20 MG capsule Commonly known as: PRILOSEC Take 1 capsule (20 mg total) by mouth every morning.   potassium chloride 10 MEQ CR capsule Commonly known as: MICRO-K Take 10 mEq by mouth daily.   TechLite Pen Needles 31G X 8 MM Misc Generic drug: Insulin Pen Needle Use 3 (three) times daily.   True Metrix Blood Glucose Test test strip Generic drug: glucose blood Use as directed to check blood sugar three times daily   TRUEdraw Lancing Device Misc Use 3 (three) times daily. May dispense any manufacturer covered by patient's insurance   TRUEplus  Lancets 28G Misc Use 3 (three) times daily as directed to check blood sugar        No Known Allergies  Consultations: Orthopedic surgery  Procedures:   Discharge Exam: BP (!) 146/64 (BP Location: Right Arm)   Pulse 91   Temp 98 F (36.7 C)   Resp 18   Ht 5\' 7"  (1.702 m)   Wt 99.8 kg   SpO2 100%   BMI 34.46 kg/m  Physical Exam Constitutional:      General: He is not in acute distress.    Appearance: Normal appearance.  HENT:     Head: Normocephalic and atraumatic.     Mouth/Throat:     Mouth: Mucous membranes are dry.  Eyes:     Extraocular Movements: Extraocular movements intact.  Cardiovascular:     Rate and Rhythm: Normal rate and regular rhythm.  Pulmonary:     Effort: Pulmonary effort is normal. No respiratory distress.     Breath sounds: Normal breath sounds. No wheezing.  Abdominal:     General: Bowel sounds are normal. There is no distension.     Palpations: Abdomen is soft.     Tenderness: There is no abdominal tenderness.  Musculoskeletal:     Cervical back: Normal range of motion and neck supple.     Comments: LUE noted with clear bullae along distal forearm and generalized edema from upper arm to hand. Tenderness with passive ROM in elbow and generalized TTP throughout soft tissues; overall shows improved erythema from admission  Skin:    General: Skin is warm and dry.  Neurological:     General: No focal deficit present.     Mental Status: He is alert and oriented to person, place, and time.      The results of significant diagnostics from this hospitalization (including imaging, microbiology, ancillary and laboratory) are listed below for reference.   Microbiology: Recent Results (from the past 240 hour(s))  Culture, blood (routine x 2)     Status: None   Collection Time: 05/11/23  8:40 AM   Specimen: BLOOD  Result Value Ref Range Status   Specimen Description   Final    BLOOD LEFT ANTECUBITAL Performed at [facility],  [address] [place], [place] [address]    Special Requests   Final    BOTTLES DRAWN AEROBIC AND ANAEROBIC Blood Culture adequate volume Performed at [facility], [address], [place], [place] [address]    Culture   Final  NO GROWTH 5 DAYS Performed at [facility], [address] [place], [place] [address]    Report Status 05/16/2023 FINAL  Final  Culture, blood (routine x 2)     Status: None   Collection Time: 05/11/23  8:45 AM   Specimen: BLOOD  Result Value Ref Range Status   Specimen Description   Final    BLOOD RIGHT ANTECUBITAL Performed at [facility], [address], [place], [place] [address]    Special Requests   Final    BOTTLES DRAWN AEROBIC AND ANAEROBIC Blood Culture adequate volume Performed at [facility], [address], [place], [place] [address]    Culture   Final    NO GROWTH 5 DAYS Performed at [facility], [address], [place], [place] [address]    Report Status 05/16/2023 FINAL  Final  SARS Coronavirus 2 by RT PCR (hospital order, performed in [facility] hospital lab) *cepheid single result test* Anterior Nasal Swab     Status: None   Collection Time: 05/11/23  8:54 AM   Specimen: Anterior Nasal Swab  Result Value Ref Range Status   SARS Coronavirus 2 by RT PCR NEGATIVE NEGATIVE Final    Comment: (NOTE) SARS-CoV-2 target nucleic acids are NOT DETECTED.  The SARS-CoV-2 RNA is generally detectable in upper and lower respiratory specimens during the acute phase of infection. The lowest concentration of SARS-CoV-2 viral copies this assay can detect is 250 copies / mL. A negative result does not preclude SARS-CoV-2 infection and should not be used as the sole basis for treatment or other patient management decisions.  A negative result may occur with improper specimen collection / handling, submission of specimen other than nasopharyngeal swab, presence of viral  mutation(s) within the areas targeted by this assay, and inadequate number of viral copies (<250 copies / mL). A negative result must be combined with clinical observations, patient history, and epidemiological information.  Fact Sheet for Patients:   RoadLapTop.co.za  Fact Sheet for Healthcare Providers: http://kim-miller.com/  This test is not yet approved or  cleared by the [place] FDA and has been authorized for detection and/or diagnosis of SARS-CoV-2 by FDA under an Emergency Use Authorization (EUA).  This EUA will remain in effect (meaning this test can be used) for the duration of the COVID-19 declaration under Section 564(b)(1) of the Act, 21 U.S.C. section 360bbb-3(b)(1), unless the authorization is terminated or revoked sooner.  Performed at [facility], [address], [place], [place] [address]   Culture, blood (routine x 2)     Status: None (Preliminary result)   Collection Time: 05/15/23  9:48 PM   Specimen: BLOOD  Result Value Ref Range Status   Specimen Description   Final    BLOOD RIGHT ANTECUBITAL Performed at [facility], [address] [place], [place] [address]    Special Requests   Final    BOTTLES DRAWN AEROBIC AND ANAEROBIC Blood Culture results may not be optimal due to an excessive volume of blood received in culture bottles   Culture   Final    NO GROWTH 1 DAY Performed at [facility], [address], [place], [place] [address]    Report Status PENDING  Incomplete  Culture, blood (Routine X 2) w Reflex to ID Panel     Status: None (Preliminary result)   Collection Time: 05/16/23 10:06 AM   Specimen: BLOOD  Result Value Ref Range Status  Specimen Description BLOOD BLOOD RIGHT HAND  Final   Special Requests   Final    BOTTLES DRAWN AEROBIC ONLY Blood Culture adequate volume   Culture   Final    NO GROWTH < 24 HOURS Performed at [facility], [address] [place], [place] [address]    Report Status PENDING  Incomplete     Labs: BNP (last 3 results) No results for input(s): "BNP" in the last 8760 hours. Basic Metabolic Panel: Recent Labs  Lab 05/11/23 0840 05/15/23 2116 05/16/23 0332 05/17/23 0106  NA 131* 131* 132* 135  K 3.4* 3.2* 2.9* 3.6  CL 99 95* 97* 105  CO2 22 25 22  20*  GLUCOSE 296* 233* 228* 123*  BUN 30* 60* 55* 42*  CREATININE 1.83* 2.14* 1.87* 1.50*  CALCIUM 8.6* 8.2* 7.9* 8.0*  MG  --   --   --  1.4*   Liver Function Tests: Recent Labs  Lab 05/11/23 0840 05/15/23 2116  AST 14* 28  ALT 13 21  ALKPHOS 123 123  BILITOT 0.8 0.7  PROT 6.9 6.4*  ALBUMIN 3.5 2.7*   No results for input(s): "LIPASE", "AMYLASE" in the last 168 hours. No results for input(s): "AMMONIA" in the last 168 hours. CBC: Recent Labs  Lab 05/11/23 0840 05/15/23 2116 05/16/23 0332 05/17/23 0106  WBC 18.5* 20.3* 18.4* 14.8*  NEUTROABS 16.3* 17.4*  --  12.1*  HGB 13.0 12.2* 11.1* 11.1*  HCT 39.2 35.9* 32.7* 33.2*  MCV 88.7 86.3 84.7 86.0  PLT 161 227 171 212   Cardiac Enzymes: Recent Labs  Lab 05/15/23 2200  CKTOTAL 267   BNP: Invalid input(s): "POCBNP" CBG: Recent Labs  Lab 05/16/23 1955 05/16/23 2334 05/17/23 0502 05/17/23 0757 05/17/23 1120  GLUCAP 140* 119* 144* 112* 161*   D-Dimer No results for input(s): "DDIMER" in the last 72 hours. Hgb A1c No results for input(s): "HGBA1C" in the last 72 hours. Lipid Profile No results for input(s): "CHOL", "HDL", "LDLCALC", "TRIG", "CHOLHDL", "LDLDIRECT" in the last 72 hours. Thyroid function studies No results for input(s): "TSH", "T4TOTAL", "T3FREE", "THYROIDAB" in the last 72 hours.  Invalid input(s): "FREET3" Anemia work up No results for input(s): "VITAMINB12", "FOLATE", "FERRITIN", "TIBC", "IRON", "RETICCTPCT" in the last 72 hours. Urinalysis    Component Value Date/Time   COLORURINE YELLOW 05/16/2023 0634   APPEARANCEUR HAZY (A) 05/16/2023  0634   LABSPEC 1.017 05/16/2023 0634   PHURINE 5.0 05/16/2023 0634   GLUCOSEU 50 (A) 05/16/2023 0634   HGBUR SMALL (A) 05/16/2023 0634   BILIRUBINUR NEGATIVE 05/16/2023 0634   KETONESUR NEGATIVE 05/16/2023 0634   PROTEINUR >=300 (A) 05/16/2023 0634   NITRITE NEGATIVE 05/16/2023 0634   LEUKOCYTESUR NEGATIVE 05/16/2023 0634   Sepsis Labs Recent Labs  Lab 05/11/23 0840 05/15/23 2116 05/16/23 0332 05/17/23 0106  WBC 18.5* 20.3* 18.4* 14.8*   Microbiology Recent Results (from the past 240 hour(s))  Culture, blood (routine x 2)     Status: None   Collection Time: 05/11/23  8:40 AM   Specimen: BLOOD  Result Value Ref Range Status   Specimen Description   Final    BLOOD LEFT ANTECUBITAL Performed at [facility], [address], [place], [place] [address]    Special Requests   Final    BOTTLES DRAWN AEROBIC AND ANAEROBIC Blood Culture adequate volume Performed at [facility], [address] [place], [place] [address]    Culture   Final    NO GROWTH 5  DAYS Performed at [facility], [address], [place], [place] [address]    Report Status 05/16/2023 FINAL  Final  Culture, blood (routine x 2)     Status: None   Collection Time: 05/11/23  8:45 AM   Specimen: BLOOD  Result Value Ref Range Status   Specimen Description   Final    BLOOD RIGHT ANTECUBITAL Performed at [facility], [address] [place], [place] [address]    Special Requests   Final    BOTTLES DRAWN AEROBIC AND ANAEROBIC Blood Culture adequate volume Performed at [facility], [address], [place], [place] [address]    Culture   Final    NO GROWTH 5 DAYS Performed at [facility], [address] [place], [place] [address]    Report Status 05/16/2023 FINAL  Final  SARS Coronavirus 2 by RT PCR (hospital order, performed in [facility] hospital lab) *cepheid single result test* Anterior Nasal Swab     Status: None    Collection Time: 05/11/23  8:54 AM   Specimen: Anterior Nasal Swab  Result Value Ref Range Status   SARS Coronavirus 2 by RT PCR NEGATIVE NEGATIVE Final    Comment: (NOTE) SARS-CoV-2 target nucleic acids are NOT DETECTED.  The SARS-CoV-2 RNA is generally detectable in upper and lower respiratory specimens during the acute phase of infection. The lowest concentration of SARS-CoV-2 viral copies this assay can detect is 250 copies / mL. A negative result does not preclude SARS-CoV-2 infection and should not be used as the sole basis for treatment or other patient management decisions.  A negative result may occur with improper specimen collection / handling, submission of specimen other than nasopharyngeal swab, presence of viral mutation(s) within the areas targeted by this assay, and inadequate number of viral copies (<250 copies / mL). A negative result must be combined with clinical observations, patient history, and epidemiological information.  Fact Sheet for Patients:   RoadLapTop.co.za  Fact Sheet for Healthcare Providers: http://kim-miller.com/  This test is not yet approved or  cleared by the [place] FDA and has been authorized for detection and/or diagnosis of SARS-CoV-2 by FDA under an Emergency Use Authorization (EUA).  This EUA will remain in effect (meaning this test can be used) for the duration of the COVID-19 declaration under Section 564(b)(1) of the Act, 21 U.S.C. section 360bbb-3(b)(1), unless the authorization is terminated or revoked sooner.  Performed at [facility], [address] [place], [place] [address]   Culture, blood (routine x 2)     Status: None (Preliminary result)   Collection Time: 05/15/23  9:48 PM   Specimen: BLOOD  Result Value Ref Range Status   Specimen Description   Final    BLOOD RIGHT ANTECUBITAL Performed at [facility], [address], [place], [place] [address]    Special Requests   Final    BOTTLES DRAWN AEROBIC AND ANAEROBIC Blood Culture results may not be optimal due to an excessive volume of blood received in culture bottles   Culture   Final    NO GROWTH 1 DAY Performed at [facility], [address] [place], [place] [address]    Report Status PENDING  Incomplete  Culture, blood (Routine X 2) w Reflex to ID Panel     Status: None (Preliminary result)   Collection Time: 05/16/23 10:06 AM   Specimen: BLOOD  Result Value Ref Range Status   Specimen  Description BLOOD BLOOD RIGHT HAND  Final   Special Requests   Final    BOTTLES DRAWN AEROBIC ONLY Blood Culture adequate volume   Culture   Final    NO GROWTH < 24 HOURS Performed at [facility], [address], [place], [place] [address]    Report Status PENDING  Incomplete    Procedures/Studies: CT HUMERUS LEFT W CONTRAST  Result Date: 05/17/2023 CLINICAL DATA:  Soft tissue infection suspected, upper arm, no prior imaging EXAM: CT OF THE UPPER LEFT EXTREMITY WITH CONTRAST TECHNIQUE: Multidetector CT imaging of the left humerus was performed according to the standard protocol following intravenous contrast administration. RADIATION DOSE REDUCTION: This exam was performed according to the departmental dose-optimization program which includes automated exposure control, adjustment of the mA and/or kV according to patient size and/or use of iterative reconstruction technique. CONTRAST:  75mL OMNIPAQUE IOHEXOL 350 MG/ML SOLN COMPARISON:  X-ray 05/15/2023 FINDINGS: Bones/Joint/Cartilage Left humerus intact without fracture or dislocation. Sequela of remote [facility] joint injury with widening and fragmentation at the [facility] joint and coracoclavicular interval. Moderate glenohumeral joint osteoarthritis. No appreciable glenohumeral joint effusion. Small elbow joint effusion. No erosion or periosteal elevation. Ligaments Suboptimally assessed by CT. Muscles and Tendons  Incidental note of a 2.8 x 0.8 cm intramuscular lipoma within the mid left triceps muscle. Additional 4.1 x 1.1 cm lipoma within the brachialis muscle of the mid upper arm. Decreased attenuation within the superficial aspect of the mid to distal triceps muscle belly likely representing intramuscular edema/myositis. No organized intramuscular fluid collection. Soft tissues Skin thickening with extensive soft tissue edema and fluid within the subcutaneous soft tissues of the upper arm, most confluent at the posterior aspect of the elbow. No organized or rim enhancing fluid collection is present at this time. No soft tissue gas. Multiple enlarged left axillary lymph nodes, likely reactive. IMPRESSION: 1. Skin thickening with extensive soft tissue edema and fluid within the subcutaneous soft tissues of the upper arm, most confluent at the posterior aspect of the elbow. Findings are compatible with cellulitis. No organized or rim enhancing fluid collection is present at this time. 2. Decreased attenuation within the superficial aspect of the mid to distal triceps muscle belly likely representing intramuscular edema/myositis. 3. Small elbow joint effusion, nonspecific. Septic arthritis not excluded. 4. No CT evidence of osteomyelitis. 5. Multiple enlarged left axillary lymph nodes, likely reactive. Electronically Signed   By: Duanne Guess D.O.   On: 05/17/2023 08:52   CT FOREARM LEFT W CONTRAST  Result Date: 05/17/2023 CLINICAL DATA:  Soft tissue infection suspected, forearm, xray done EXAM: CT OF THE UPPER LEFT EXTREMITY WITH CONTRAST TECHNIQUE: Multidetector CT imaging of the left forearm was performed according to the standard protocol following intravenous contrast administration. RADIATION DOSE REDUCTION: This exam was performed according to the departmental dose-optimization program which includes automated exposure control, adjustment of the mA and/or kV according to patient size and/or use of iterative  reconstruction technique. CONTRAST:  75mL OMNIPAQUE IOHEXOL 350 MG/ML SOLN COMPARISON:  X-ray 05/15/2023 FINDINGS: Bones/Joint/Cartilage Left radius and ulna are intact. No fracture or dislocation. Small elbow joint effusion, nonspecific. 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 Mild degenerative changes within the wrist. No erosion or periosteal elevation. Ligaments Suboptimally assessed by CT. Muscles and Tendons No acute musculotendinous abnormality by CT. No intramuscular fluid collection is identified. Soft tissues Skin thickening with extensive subcutaneous edema and ill-defined fluid throughout the soft tissues of the left forearm. Fluid is most confluent at the dorsal  aspect of the elbow and proximal forearm. No well-defined or rim enhancing fluid collection is evident at this time. No soft tissue gas. IMPRESSION: 1. Skin thickening with extensive subcutaneous edema and ill-defined fluid throughout the soft tissues of the left forearm, most compatible with cellulitis. No well-defined or rim enhancing fluid collection is evident at this time. 2. Small elbow joint effusion, nonspecific but likely reactive. Septic arthritis is not entirely excluded. 3. No evidence of osteomyelitis. 4. 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 Electronically Signed   By: Duanne Guess D.O.   On: 05/17/2023 08:42   VAS [place] UPPER EXTREMITY VENOUS DUPLEX  Result Date: 05/16/2023 UPPER VENOUS STUDY  Patient Name:  Justin Snow  Date of Exam:   05/16/2023 Medical Rec #: [id number]           Accession #:    [id number] Date of Birth: Sep 04, 1956           Patient Gender: M Patient Age:   67 years Exam Location:  [facility] Procedure:      VAS [place] UPPER EXTREMITY VENOUS DUPLEX Referring Phys: MICHAEL JEFFERY --------------------------------------------------------------------------------  Indications: cellulitis, Swelling, and Erythema Limitations: Body habitus, poor ultrasound/tissue  interface and lack of cooperation. Comparison Study: No previous study. Performing Technologist: McKayla Maag RVT, [place]  Examination Guidelines: A complete evaluation includes B-mode imaging, spectral Doppler, color Doppler, and power Doppler as needed of all accessible portions of each vessel. Bilateral testing is considered an integral part of a complete examination. Limited examinations for reoccurring indications may be performed as noted.  Right Findings: +----------+------------+---------+-----------+----------+-------+ RIGHT     CompressiblePhasicitySpontaneousPropertiesSummary +----------+------------+---------+-----------+----------+-------+ Subclavian               Yes       Yes                      +----------+------------+---------+-----------+----------+-------+  Left Findings: +----------+------------+---------+-----------+----------+--------------+ LEFT      CompressiblePhasicitySpontaneousProperties   Summary     +----------+------------+---------+-----------+----------+--------------+ IJV           Full       Yes       Yes                             +----------+------------+---------+-----------+----------+--------------+ Subclavian    Full       Yes       Yes                             +----------+------------+---------+-----------+----------+--------------+ Axillary      Full       Yes       Yes                             +----------+------------+---------+-----------+----------+--------------+ Brachial      Full       Yes       Yes                             +----------+------------+---------+-----------+----------+--------------+ Radial        Full                                                 +----------+------------+---------+-----------+----------+--------------+  Ulnar                                               Not visualized +----------+------------+---------+-----------+----------+--------------+ Cephalic      Full                                                  +----------+------------+---------+-----------+----------+--------------+ Basilic                                             Not visualized +----------+------------+---------+-----------+----------+--------------+  Summary:  Right: No evidence of thrombosis in the subclavian.  Left: No evidence of deep vein thrombosis in the upper extremity. No evidence of superficial vein thrombosis in the upper extremity. Limited study due to patient's unwillingness to move arm away from his body.  *See table(s) above for measurements and observations.  Diagnosing physician: Sherald Hess MD Electronically signed by Sherald Hess MD on 05/16/2023 at 6:23:40 PM.    Final    DG Forearm Left  Result Date: 05/15/2023 CLINICAL DATA:  Infection on arm with new onset confusion. Arm swollen from shoulder to fingertips. EXAM: LEFT FOREARM - 2 VIEW; LEFT ELBOW - 2 VIEW COMPARISON:  None Available. FINDINGS: Diffuse soft tissue swelling about the elbow and forearm. No acute fracture or dislocation. No evidence of osteomyelitis. No elbow joint effusion. IMPRESSION: Diffuse soft tissue swelling of the left arm without acute osseous abnormality. Electronically Signed   By: Minerva Fester M.D.   On: 05/15/2023 21:41   DG Elbow 2 Views Left  Result Date: 05/15/2023 CLINICAL DATA:  Infection on arm with new onset confusion. Arm swollen from shoulder to fingertips. EXAM: LEFT FOREARM - 2 VIEW; LEFT ELBOW - 2 VIEW COMPARISON:  None Available. FINDINGS: Diffuse soft tissue swelling about the elbow and forearm. No acute fracture or dislocation. No evidence of osteomyelitis. No elbow joint effusion. IMPRESSION: Diffuse soft tissue swelling of the left arm without acute osseous abnormality. Electronically Signed   By: Minerva Fester M.D.   On: 05/15/2023 21:41   CT HEAD WO CONTRAST ( )  Result Date: 05/11/2023 CLINICAL DATA:  Mental status change, unknown cause EXAM: CT HEAD  WITHOUT CONTRAST TECHNIQUE: Contiguous axial images were obtained from the base of the skull through the vertex without intravenous contrast. RADIATION DOSE REDUCTION: This exam was performed according to the departmental dose-optimization program which includes automated exposure control, adjustment of the mA and/or kV according to patient size and/or use of iterative reconstruction technique. COMPARISON:  CT head 05/02/23 FINDINGS: Brain: No hemorrhage. No hydrocephalus. No extra-axial fluid collection. 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 No CT evidence of acute cortical infarct. No mass effect. No mass lesion. Vascular: No hyperdense vessel or unexpected calcification. Skull: Normal. Negative for fracture or focal lesion. Sinuses/Orbits: No middle ear or mastoid effusion. Paranasal sinuses are clear. Orbits are unremarkable. Other: None. IMPRESSION: No CT etiology for altered mental status identified. Electronically Signed   By: Lorenza Cambridge M.D.   On: 05/11/2023 10:35   DG Chest Portable 1 View  Result Date: 05/11/2023 CLINICAL DATA:  Altered  mental status Fever Hypertension Tachycardia EXAM: PORTABLE CHEST - 1 VIEW COMPARISON:  05/06/2023 FINDINGS: Cardiomediastinal silhouette and pulmonary vasculature are within normal limits. Minimal opacities in the right medial lung base favored to be atelectasis rather than pneumonia. Lungs are otherwise clear. Chronic left AC joint separation. IMPRESSION: No acute cardiopulmonary process. Electronically Signed   By: Acquanetta Belling M.D.   On: 05/11/2023 09:21   DG Chest Portable 1 View  Result Date: 05/06/2023 CLINICAL DATA:  Shortness of breath x1 year EXAM: PORTABLE CHEST 1 VIEW COMPARISON:  05/02/2023 FINDINGS: Lungs are clear.  No pleural effusion or pneumothorax. The heart is normal in size. IMPRESSION: No acute cardiopulmonary disease. Electronically Signed   By: Charline Bills M.D.   On: 05/06/2023 21:02    DG Chest Portable 1 View  Result Date: 05/02/2023 CLINICAL DATA:  Shortness of breath EXAM: PORTABLE CHEST 1 VIEW COMPARISON:  04/24/2023 FINDINGS: The heart size and mediastinal contours are within normal limits. Coarsened bibasilar interstitial markings. No pleural effusion or pneumothorax. The visualized skeletal structures are unremarkable. IMPRESSION: Coarsened bibasilar interstitial markings, which may reflect bronchitic lung changes. Electronically Signed   By: Duanne Guess D.O.   On: 05/02/2023 12:50   CT Head Wo Contrast  Result Date: 05/02/2023 CLINICAL DATA:  Mental status change, unknown cause EXAM: CT HEAD WITHOUT CONTRAST TECHNIQUE: Contiguous axial images were obtained from the base of the skull through the vertex without intravenous contrast. RADIATION DOSE REDUCTION: This exam was performed according to the departmental dose-optimization program which includes automated exposure control, adjustment of the mA and/or kV according to patient size and/or use of iterative reconstruction technique. COMPARISON:  Brain MRI 04/24/2023 FINDINGS: Brain: No evidence of acute infarction, hemorrhage, hydrocephalus, extra-axial collection or mass lesion/mass effect. Sequela of moderate chronic microvascular ischemic change. Chronic left thalamic infarct. Vascular: No hyperdense vessel or unexpected calcification. Skull: Normal. Negative for fracture or focal lesion. Sinuses/Orbits: No middle ear or mastoid effusion. Paranasal sinuses are clear. Orbits are unremarkable. Other: None. IMPRESSION: No acute intracranial abnormality. Electronically Signed   By: Lorenza Cambridge M.D.   On: 05/02/2023 12:27   ECHOCARDIOGRAM COMPLETE  Result Date: 04/24/2023    ECHOCARDIOGRAM REPORT   Patient Name:   Justin Snow Date of Exam: 04/24/2023 Medical Rec #:  [id number]          Height:       66.0 in Accession #:    [id number]         Weight:       206.8 lb Date of Birth:  [birth year]          BSA:          2.028  m Patient Age:    66 years           BP:           156/75 mmHg Patient Gender: M                  HR:           68 bpm. Exam Location:  Inpatient Procedure: 2D Echo, Cardiac Doppler and Color Doppler Indications:     Stroke I63.9  History:         Patient has no prior history of Echocardiogram examinations.                  Signs/Symptoms:Edema; Risk Factors:Hypertension, Current                  Smoker,  Diabetes and Dyslipidemia.  Sonographer:     Aron Baba Referring Phys:  [id number] Gordy Councilman Diagnosing Phys: Yates Decamp MD  Sonographer Comments: Technically difficult study due to poor echo windows. Image acquisition challenging due to respiratory motion, Image acquisition challenging due to uncooperative patient and Image acquisition challenging due to patient body habitus. IMPRESSIONS  1. Poor echo window. Left ventricular ejection fraction, by estimation, is 60 to 65%. The left ventricle has normal function. The left ventricle has no regional wall motion abnormalities. Left ventricular diastolic parameters are consistent with Grade II diastolic dysfunction (pseudonormalization). Elevated left ventricular end-diastolic pressure.  2. Right ventricular systolic function is normal. The right ventricular size is normal. There is normal pulmonary artery systolic pressure.  3. Left atrial size was mildly dilated.  4. The mitral valve is normal in structure. Trivial mitral valve regurgitation. No evidence of mitral stenosis.  5. The aortic valve was not well visualized. There is mild calcification of the aortic valve. Aortic valve regurgitation is not visualized. Mild aortic valve stenosis. Aortic valve area, by VTI measures 1.58 cm. Aortic valve mean gradient measures 12.0  mmHg. Aortic valve Vmax measures 2.36 m/s.  6. The inferior vena cava is normal in size with greater than 50% respiratory variability, suggesting right atrial pressure of 3 mmHg. Conclusion(s)/Recommendation(s): Consider TEE if clinically  indicated. No obvious source of cerebral embolism, poor echo window. FINDINGS  Left Ventricle: Poor echo window. Left ventricular ejection fraction, by estimation, is 60 to 65%. The left ventricle has normal function. The left ventricle has no regional wall motion abnormalities. The left ventricular internal cavity size was normal  in size. There is no left ventricular hypertrophy. Left ventricular diastolic parameters are consistent with Grade II diastolic dysfunction (pseudonormalization). Elevated left ventricular end-diastolic pressure. Right Ventricle: The right ventricular size is normal. No increase in right ventricular wall thickness. Right ventricular systolic function is normal. There is normal pulmonary artery systolic pressure. The tricuspid regurgitant velocity is 0.68 m/s, and  with an assumed right atrial pressure of 3 mmHg, the estimated right ventricular systolic pressure is 4.8 mmHg. Left Atrium: Left atrial size was mildly dilated. Right Atrium: Right atrial size was normal in size. Pericardium: There is no evidence of pericardial effusion. Mitral Valve: The mitral valve is normal in structure. Trivial mitral valve regurgitation. No evidence of mitral valve stenosis. Tricuspid Valve: The tricuspid valve is normal in structure. Tricuspid valve regurgitation is trivial. No evidence of tricuspid stenosis. Aortic Valve: The aortic valve was not well visualized. There is mild calcification of the aortic valve. Aortic valve regurgitation is not visualized. Mild aortic stenosis is present. Aortic valve mean gradient measures 12.0 mmHg. Aortic valve peak gradient measures 22.3 mmHg. Aortic valve area, by VTI measures 1.58 cm. Pulmonic Valve: The pulmonic valve was not well visualized. Pulmonic valve regurgitation is not visualized. Aorta: The aortic root and ascending aorta are structurally normal, with no evidence of dilitation. Venous: The inferior vena cava is normal in size with greater than 50%  respiratory variability, suggesting right atrial pressure of 3 mmHg. IAS/Shunts: The interatrial septum was not assessed.  LEFT VENTRICLE PLAX 2D LVIDd:         3.70 cm   Diastology LVIDs:         2.60 cm   LV e' medial:    5.44 cm/s LV PW:         0.90 cm   LV E/e' medial:  14.4 LV IVS:  0.90 cm   LV e' lateral:   7.51 cm/s LVOT diam:     2.10 cm   LV E/e' lateral: 10.4 LV SV:         77 LV SV Index:   38 LVOT Area:     3.46 cm  RIGHT VENTRICLE RV S prime:     18.60 cm/s TAPSE (M-mode): 1.7 cm LEFT ATRIUM             Index        RIGHT ATRIUM           Index LA diam:        3.50 cm 1.73 cm/m   RA Area:     15.20 cm LA Vol (A2C):   51.5 ml 25.39 ml/m  RA Volume:   36.40 ml  17.95 ml/m LA Vol (A4C):   33.5 ml 16.52 ml/m LA Biplane Vol: 45.6 ml 22.48 ml/m  AORTIC VALVE AV Area (Vmax):    1.69 cm AV Area (Vmean):   1.65 cm AV Area (VTI):     1.58 cm AV Vmax:           236.00 cm/s AV Vmean:          156.000 cm/s AV VTI:            0.486 m AV Peak Grad:      22.3 mmHg AV Mean Grad:      12.0 mmHg LVOT Vmax:         115.00 cm/s LVOT Vmean:        74.400 cm/s LVOT VTI:          0.221 m LVOT/AV VTI ratio: 0.45  AORTA Ao Root diam: 3.70 cm Ao Asc diam:  3.50 cm MITRAL VALVE                TRICUSPID VALVE MV Area (PHT): 2.05 cm     TR Peak grad:   1.8 mmHg MV Decel Time: 370 msec     TR Vmax:        67.80 cm/s MR Peak grad: 4.1 mmHg MR Vmax:      101.00 cm/s   SHUNTS MV E velocity: 78.10 cm/s   Systemic VTI:  0.22 m MV A velocity: 101.00 cm/s  Systemic Diam: 2.10 cm MV E/A ratio:  0.77 Yates Decamp MD Electronically signed by Yates Decamp MD Signature Date/Time: 04/24/2023/11:00:11 PM    Final    MR BRAIN WO CONTRAST  Result Date: 04/24/2023 CLINICAL DATA:  67 year old male code stroke presentation. Slurred speech. Subcentimeter hypodensity in the left thalamus, hemorrhage versus cavernous malformation. EXAM: MRI HEAD WITHOUT CONTRAST TECHNIQUE: Multiplanar, multiecho pulse sequences of the brain and surrounding  structures were obtained without intravenous contrast. COMPARISON:  CTA head and neck and head CT 0312 hours today. FINDINGS: The examination had to be discontinued prior to completion, the patient was trying to climb out of the scanner. Mildly degraded axial and coronal DWI, axial T2 and sagittal T1 weighted imaging was obtained. Major intracranial vascular flow voids are preserved. No midline shift. No ventriculomegaly. Cervicomedullary junction and pituitary are within normal limits. Diffusion-weighted imaging is highly heterogeneous at the left thalamus which appears in part due to extensive susceptibility from blood products. And there is a small 4 mm focus of T2 hypointensity corresponding to the hyperdense CT lesion on series 5, image 11. No significant mass effect or edema. 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 Occasional brainstem chronic lacunar infarcts. DWI is positive for a 4-5 mm right corona radiata or pre motor subcortical white matter lacunar infarct on series 2, image 36. No evidence of hemorrhage or mass effect there. No other restricted diffusion. IMPRESSION: 1. Truncated and mildly motion degraded noncontrast MRI. 2. Small acute Left thalamic hemorrhage (4 mm, no edema or mass effect) superimposed on evidence of extensive chronic microhemorrhages in the deep gray nuclei. 3. And positive also for a small acute white matter lacunar infarct in the right centrum semiovale. No associated hemorrhage or mass effect. 4. Underlying advanced chronic small-vessel disease. Salient findings were communicated to Dr. Iver Nestle At 6:46 am on 04/24/2023 by text page via the [facility] messaging system. Electronically Signed   By: Odessa Fleming M.D.   On: 04/24/2023 06:46   DG Chest Port 1 View  Result Date: 04/24/2023 CLINICAL DATA:  Altered mental status with slurred speech and left-sided facial droop. EXAM: PORTABLE  CHEST 1 VIEW COMPARISON:  October 01, 2022 FINDINGS: The heart size and mediastinal contours are within normal limits. Mild atelectasis is seen within the bilateral lung bases. No pleural effusions no pneumothorax is identified. Multilevel degenerative changes are seen within the lower thoracic spine. IMPRESSION: Mild bibasilar atelectasis. Electronically Signed   By: Aram Candela M.D.   On: 04/24/2023 04:29   CT ANGIO HEAD NECK W WO CM (CODE STROKE)  Result Date: 04/24/2023 CLINICAL DATA:  Slurred speech EXAM: CT ANGIOGRAPHY HEAD AND NECK WITH AND WITHOUT CONTRAST TECHNIQUE: Multidetector CT imaging of the head and neck was performed using the standard protocol during bolus administration of intravenous contrast. Multiplanar CT image reconstructions and MIPs were obtained to evaluate the vascular anatomy. Carotid stenosis measurements (when applicable) are obtained utilizing NASCET criteria, using the distal internal carotid diameter as the denominator. RADIATION DOSE REDUCTION: This exam was performed according to the departmental dose-optimization program which includes automated exposure control, adjustment of the mA and/or kV according to patient size and/or use of iterative reconstruction technique. CONTRAST:  75mL OMNIPAQUE IOHEXOL 350 MG/ML SOLN COMPARISON:  None Available. FINDINGS: CTA NECK FINDINGS SKELETON: There is no bony spinal canal stenosis. No lytic or blastic lesion. OTHER NECK: Normal pharynx, larynx and major salivary glands. No cervical lymphadenopathy. Unremarkable thyroid gland. UPPER CHEST: Biapical emphysema AORTIC ARCH: There is calcific atherosclerosis of the aortic arch. Conventional 3 vessel aortic branching pattern. RIGHT CAROTID SYSTEM: No dissection, occlusion or aneurysm. Mild atherosclerotic calcification at the carotid bifurcation without hemodynamically significant stenosis. LEFT CAROTID SYSTEM: No dissection, occlusion or aneurysm. Mild atherosclerotic calcification at  the carotid bifurcation without hemodynamically significant stenosis. VERTEBRAL ARTERIES: Left dominant configuration.There is no dissection, occlusion or flow-limiting stenosis to the skull base (V1-V3 segments). CTA HEAD FINDINGS POSTERIOR CIRCULATION: --Vertebral arteries: Normal V4 segments. --Inferior cerebellar arteries: Normal. --Basilar artery: Normal. --Superior cerebellar arteries: Normal. --Posterior cerebral arteries (PCA): Severe stenosis at the right P1 P2 junction. Fetal origin of the left PCA. ANTERIOR CIRCULATION: --Intracranial internal carotid arteries: Atherosclerotic calcification of the internal carotid arteries at the skull base without hemodynamically significant stenosis. --Anterior cerebral arteries (ACA): Normal. Both A1 segments are present. Patent anterior communicating artery (a-comm). 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 VENOUS SINUSES: As permitted by contrast timing, patent. ANATOMIC VARIANTS: Fetal origin of the left posterior cerebral artery. Review of the MIP images confirms the above findings. IMPRESSION: 1. No emergent large vessel occlusion. 2. 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 3. Severe stenosis at the right PCA P1-P2 junction. 4. Mild bilateral carotid bifurcation atherosclerosis without hemodynamically significant stenosis. Aortic Atherosclerosis (ICD10-I70.0). Electronically Signed   By: Deatra Robinson M.D.   On: 04/24/2023 03:46   CT HEAD CODE STROKE WO CONTRAST  Result Date: 04/24/2023 CLINICAL DATA:  Code stroke.  Altered mental status EXAM: CT HEAD WITHOUT CONTRAST TECHNIQUE: Contiguous axial images were obtained from the base of the skull through the vertex without intravenous contrast. RADIATION DOSE REDUCTION: This exam was performed according to the departmental dose-optimization program which includes automated exposure control, adjustment of the mA and/or  kV according to patient size and/or use of iterative reconstruction technique. COMPARISON:  None Available. FINDINGS: Brain: 5 mm hyperdense focus of the left thalamus without surrounding edema. The size and configuration of the ventricles and extra-axial CSF spaces are normal. There is hypoattenuation of the periventricular white matter, most commonly indicating chronic ischemic microangiopathy. 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 Vascular: No abnormal hyperdensity of the major intracranial arteries or dural venous sinuses. No intracranial atherosclerosis. Skull: The visualized skull base, calvarium and extracranial soft tissues are normal. Sinuses/Orbits: No fluid levels or advanced mucosal thickening of the visualized paranasal sinuses. No mastoid or middle ear effusion. The orbits are normal. ASPECTS ([place]ber[place] Stroke Program Early CT Score) - Ganglionic level infarction (caudate, lentiform nuclei, internal capsule, insula, M1-M3 cortex): 7 - Supraganglionic infarction (M4-M6 cortex): 3 Total score (0-10 with 10 being normal): 10 IMPRESSION: 1. A 5 mm hyperdense focus of the left thalamus without surrounding edema. 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 2. ASPECTS is 10. 3. 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 These results were communicated to Dr. Brooke Dare at 3:17 am on 04/24/2023 by text page via the [facility] messaging system. Electronically Signed   By: Deatra Robinson M.D.   On: 04/24/2023 03:18     Time coordinating discharge: Over 30 minutes    Lewie Chamber, MD  [facility] 05/17/2023, 12:01 PM

## 2023-05-17 NOTE — Progress Notes (Addendum)
Pharmacy Antibiotic Note  Justin Snow is a 67 y.o. male admitted on 05/15/2023 with LUE cellulitis. CT negative for abscess or osteomyelitis. Pharmacy has been consulted for vancomycin and Unasyn dosing.   Scr has improved to 1.5 which is near baseline (~1.4-1.5), received vancomycin loading dose on 8/18. Will adjust vancomycin dosing. Tm 100.5, WBC elevated but improving.   Plan: Adjust vancomycin to 1000mg  IV q24 hours (eAUC 489, Scr 1.5, Vd 0.5) Continue Unasyn 3g IV q6 hours Monitor renal function, clinical improvement, ability to narrow antibiotics Vancomycin levels as needed  Height: 5\' 7"  (170.2 cm) Weight: 99.8 kg (220 lb) IBW/kg (Calculated) : 66.1  Temp (24hrs), Avg:98.7 F (37.1 C), Min:97.6 F (36.4 C), Max:100.5 F (38.1 C)  Recent Labs  Lab 05/11/23 0840 05/11/23 0910 05/15/23 2116 05/15/23 2155 05/16/23 0332 05/17/23 0106  WBC 18.5*  --  20.3*  --  18.4* 14.8*  CREATININE 1.83*  --  2.14*  --  1.87* 1.50*  LATICACIDVEN  --  1.8  --  1.6  --   --     Estimated Creatinine Clearance: 53.1 mL/min (A) (by C-G formula based on SCr of 1.5 mg/dL (H)).    No Known Allergies  Antimicrobials this admission: Vancomycin 8/18 >>  Unasyn 8/18 >>   Dose adjustments this admission: 8/20 Vancomycin 1500mg  IV q48h >> vancomycin 1000mg  IV q24 hours for improved renal function  Microbiology results: 8/18 BCx: ngtd  Thank you for allowing pharmacy to be a part of this patient's care.  Rexford Maus, PharmD, BCPS 05/17/2023 9:40 AM

## 2023-05-21 LAB — CULTURE, BLOOD (ROUTINE X 2)
Culture: NO GROWTH
Culture: NO GROWTH
Special Requests: ADEQUATE

## 2023-05-23 LAB — GLUCOSE, CAPILLARY: Glucose-Capillary: 173 mg/dL — ABNORMAL HIGH (ref 70–99)

## 2023-05-24 ENCOUNTER — Other Ambulatory Visit: Payer: Self-pay

## 2023-05-24 ENCOUNTER — Inpatient Hospital Stay (HOSPITAL_COMMUNITY)
Admission: EM | Admit: 2023-05-24 | Discharge: 2023-06-02 | DRG: 501 | Payer: Medicaid Other | Attending: Obstetrics and Gynecology | Admitting: Obstetrics and Gynecology

## 2023-05-24 ENCOUNTER — Encounter (HOSPITAL_COMMUNITY): Payer: Self-pay

## 2023-05-24 ENCOUNTER — Emergency Department (HOSPITAL_COMMUNITY): Payer: Medicaid Other

## 2023-05-24 DIAGNOSIS — F32A Depression, unspecified: Secondary | ICD-10-CM | POA: Diagnosis present

## 2023-05-24 DIAGNOSIS — F17210 Nicotine dependence, cigarettes, uncomplicated: Secondary | ICD-10-CM | POA: Diagnosis present

## 2023-05-24 DIAGNOSIS — Z781 Physical restraint status: Secondary | ICD-10-CM

## 2023-05-24 DIAGNOSIS — E1165 Type 2 diabetes mellitus with hyperglycemia: Secondary | ICD-10-CM | POA: Diagnosis present

## 2023-05-24 DIAGNOSIS — I129 Hypertensive chronic kidney disease with stage 1 through stage 4 chronic kidney disease, or unspecified chronic kidney disease: Secondary | ICD-10-CM | POA: Diagnosis present

## 2023-05-24 DIAGNOSIS — Z7951 Long term (current) use of inhaled steroids: Secondary | ICD-10-CM

## 2023-05-24 DIAGNOSIS — M71122 Other infective bursitis, left elbow: Principal | ICD-10-CM | POA: Diagnosis present

## 2023-05-24 DIAGNOSIS — N1831 Chronic kidney disease, stage 3a: Secondary | ICD-10-CM | POA: Diagnosis present

## 2023-05-24 DIAGNOSIS — L03114 Cellulitis of left upper limb: Secondary | ICD-10-CM | POA: Diagnosis present

## 2023-05-24 DIAGNOSIS — R451 Restlessness and agitation: Secondary | ICD-10-CM | POA: Diagnosis not present

## 2023-05-24 DIAGNOSIS — Z8673 Personal history of transient ischemic attack (TIA), and cerebral infarction without residual deficits: Secondary | ICD-10-CM

## 2023-05-24 DIAGNOSIS — L0390 Cellulitis, unspecified: Principal | ICD-10-CM

## 2023-05-24 DIAGNOSIS — Z5982 Transportation insecurity: Secondary | ICD-10-CM

## 2023-05-24 DIAGNOSIS — Z5941 Food insecurity: Secondary | ICD-10-CM

## 2023-05-24 DIAGNOSIS — L02414 Cutaneous abscess of left upper limb: Secondary | ICD-10-CM | POA: Diagnosis present

## 2023-05-24 DIAGNOSIS — Z794 Long term (current) use of insulin: Secondary | ICD-10-CM

## 2023-05-24 DIAGNOSIS — Z8249 Family history of ischemic heart disease and other diseases of the circulatory system: Secondary | ICD-10-CM

## 2023-05-24 DIAGNOSIS — Z79899 Other long term (current) drug therapy: Secondary | ICD-10-CM

## 2023-05-24 DIAGNOSIS — J45909 Unspecified asthma, uncomplicated: Secondary | ICD-10-CM | POA: Diagnosis present

## 2023-05-24 DIAGNOSIS — E1122 Type 2 diabetes mellitus with diabetic chronic kidney disease: Secondary | ICD-10-CM | POA: Diagnosis present

## 2023-05-24 DIAGNOSIS — M71129 Other infective bursitis, unspecified elbow: Secondary | ICD-10-CM

## 2023-05-24 DIAGNOSIS — Z7984 Long term (current) use of oral hypoglycemic drugs: Secondary | ICD-10-CM

## 2023-05-24 LAB — CBC WITH DIFFERENTIAL/PLATELET
Abs Immature Granulocytes: 0.3 10*3/uL — ABNORMAL HIGH (ref 0.00–0.07)
Basophils Absolute: 0.1 10*3/uL (ref 0.0–0.1)
Basophils Relative: 0 %
Eosinophils Absolute: 0.1 10*3/uL (ref 0.0–0.5)
Eosinophils Relative: 0 %
HCT: 35.5 % — ABNORMAL LOW (ref 39.0–52.0)
Hemoglobin: 11.8 g/dL — ABNORMAL LOW (ref 13.0–17.0)
Immature Granulocytes: 2 %
Lymphocytes Relative: 10 %
Lymphs Abs: 2 10*3/uL (ref 0.7–4.0)
MCH: 28.4 pg (ref 26.0–34.0)
MCHC: 33.2 g/dL (ref 30.0–36.0)
MCV: 85.5 fL (ref 80.0–100.0)
Monocytes Absolute: 1.1 10*3/uL — ABNORMAL HIGH (ref 0.1–1.0)
Monocytes Relative: 6 %
Neutro Abs: 16.9 10*3/uL — ABNORMAL HIGH (ref 1.7–7.7)
Neutrophils Relative %: 82 %
Platelets: 388 10*3/uL (ref 150–400)
RBC: 4.15 MIL/uL — ABNORMAL LOW (ref 4.22–5.81)
RDW: 13.5 % (ref 11.5–15.5)
WBC: 20.5 10*3/uL — ABNORMAL HIGH (ref 4.0–10.5)
nRBC: 0 % (ref 0.0–0.2)

## 2023-05-24 LAB — COMPREHENSIVE METABOLIC PANEL
ALT: 14 U/L (ref 0–44)
AST: 17 U/L (ref 15–41)
Albumin: 2.1 g/dL — ABNORMAL LOW (ref 3.5–5.0)
Alkaline Phosphatase: 114 U/L (ref 38–126)
Anion gap: 10 (ref 5–15)
BUN: 26 mg/dL — ABNORMAL HIGH (ref 8–23)
CO2: 23 mmol/L (ref 22–32)
Calcium: 8.2 mg/dL — ABNORMAL LOW (ref 8.9–10.3)
Chloride: 101 mmol/L (ref 98–111)
Creatinine, Ser: 1.51 mg/dL — ABNORMAL HIGH (ref 0.61–1.24)
GFR, Estimated: 50 mL/min — ABNORMAL LOW (ref >60)
Glucose, Bld: 155 mg/dL — ABNORMAL HIGH (ref 70–99)
Potassium: 3.9 mmol/L (ref 3.5–5.1)
Sodium: 134 mmol/L — ABNORMAL LOW (ref 135–145)
Total Bilirubin: 0.8 mg/dL (ref 0.3–1.2)
Total Protein: 5.9 g/dL — ABNORMAL LOW (ref 6.5–8.1)

## 2023-05-24 LAB — GLUCOSE, CAPILLARY: Glucose-Capillary: 138 mg/dL — ABNORMAL HIGH (ref 70–99)

## 2023-05-24 LAB — I-STAT CG4 LACTIC ACID, ED
Lactic Acid, Venous: 1 mmol/L (ref 0.5–1.9)
Lactic Acid, Venous: 1.7 mmol/L (ref 0.5–1.9)

## 2023-05-24 MED ORDER — INSULIN ASPART 100 UNIT/ML FLEXPEN: 0.0000 [IU] | PEN_INJECTOR | Freq: Three times a day (TID) | SUBCUTANEOUS | Status: DC

## 2023-05-24 MED ORDER — INSULIN ASPART 100 UNIT/ML IJ SOLN: 0.0000 [IU] | Freq: Every day | INTRAMUSCULAR | Status: DC

## 2023-05-24 MED ORDER — ACETAMINOPHEN 325 MG PO TABS
650.0000 mg | ORAL_TABLET | Freq: Four times a day (QID) | ORAL | Status: DC | PRN
  Administered 2023-05-24 – 2023-05-26 (×2): 650 mg via ORAL
  Filled 2023-05-24 (×2): qty 2

## 2023-05-24 MED ORDER — INSULIN ASPART 100 UNIT/ML IJ SOLN
0.0000 [IU] | Freq: Three times a day (TID) | INTRAMUSCULAR | Status: DC
  Administered 2023-05-25 (×2): 2 [IU] via SUBCUTANEOUS
  Administered 2023-05-25 – 2023-05-26 (×3): 3 [IU] via SUBCUTANEOUS
  Administered 2023-05-27 (×2): 2 [IU] via SUBCUTANEOUS

## 2023-05-24 MED ORDER — SODIUM CHLORIDE 0.9% FLUSH
3.0000 mL | Freq: Two times a day (BID) | INTRAVENOUS | Status: DC
  Administered 2023-05-24 – 2023-06-02 (×15): 3 mL via INTRAVENOUS

## 2023-05-24 MED ORDER — MORPHINE SULFATE (PF) 2 MG/ML IV SOLN
1.0000 mg | Freq: Four times a day (QID) | INTRAVENOUS | Status: DC | PRN
  Administered 2023-05-29 – 2023-05-31 (×2): 1 mg via INTRAVENOUS
  Filled 2023-05-24 (×2): qty 1

## 2023-05-24 MED ORDER — POTASSIUM CHLORIDE CRYS ER 10 MEQ PO TBCR
10.0000 meq | EXTENDED_RELEASE_TABLET | Freq: Every day | ORAL | Status: DC
  Administered 2023-05-25 – 2023-05-26 (×2): 10 meq via ORAL
  Filled 2023-05-24 (×2): qty 1

## 2023-05-24 MED ORDER — BISACODYL 5 MG PO TBEC: 5.0000 mg | DELAYED_RELEASE_TABLET | Freq: Every day | ORAL | Status: DC | PRN

## 2023-05-24 MED ORDER — HYDROCHLOROTHIAZIDE 12.5 MG PO TABS
12.5000 mg | ORAL_TABLET | Freq: Every day | ORAL | Status: DC
  Administered 2023-05-25 – 2023-05-29 (×5): 12.5 mg via ORAL
  Filled 2023-05-24 (×5): qty 1

## 2023-05-24 MED ORDER — PANTOPRAZOLE SODIUM 40 MG PO TBEC
40.0000 mg | DELAYED_RELEASE_TABLET | Freq: Every day | ORAL | Status: DC
  Administered 2023-05-25 – 2023-06-02 (×9): 40 mg via ORAL
  Filled 2023-05-24 (×9): qty 1

## 2023-05-24 MED ORDER — ALBUTEROL SULFATE (2.5 MG/3ML) 0.083% IN NEBU: 2.5000 mg | INHALATION_SOLUTION | Freq: Four times a day (QID) | RESPIRATORY_TRACT | Status: DC | PRN

## 2023-05-24 MED ORDER — LISINOPRIL 20 MG PO TABS
40.0000 mg | ORAL_TABLET | Freq: Every day | ORAL | Status: DC
  Administered 2023-05-25 – 2023-06-02 (×9): 40 mg via ORAL
  Filled 2023-05-24 (×9): qty 2

## 2023-05-24 MED ORDER — ONDANSETRON HCL 4 MG PO TABS: 4.0000 mg | ORAL_TABLET | Freq: Four times a day (QID) | ORAL | Status: DC | PRN

## 2023-05-24 MED ORDER — INSULIN GLARGINE-YFGN 100 UNIT/ML ~~LOC~~ SOLN
15.0000 [IU] | Freq: Every day | SUBCUTANEOUS | Status: DC
  Administered 2023-05-25 – 2023-05-27 (×3): 15 [IU] via SUBCUTANEOUS
  Filled 2023-05-24 (×4): qty 0.15

## 2023-05-24 MED ORDER — ACETAMINOPHEN 500 MG PO TABS
1000.0000 mg | ORAL_TABLET | Freq: Once | ORAL | Status: DC
  Filled 2023-05-24: qty 2

## 2023-05-24 MED ORDER — AMLODIPINE BESYLATE 10 MG PO TABS
10.0000 mg | ORAL_TABLET | Freq: Every day | ORAL | Status: DC
  Administered 2023-05-25 – 2023-06-02 (×9): 10 mg via ORAL
  Filled 2023-05-24 (×9): qty 1

## 2023-05-24 MED ORDER — MOMETASONE FURO-FORMOTEROL FUM 200-5 MCG/ACT IN AERO: 2.0000 | INHALATION_SPRAY | Freq: Two times a day (BID) | RESPIRATORY_TRACT | Status: DC

## 2023-05-24 MED ORDER — ENOXAPARIN SODIUM 40 MG/0.4ML IJ SOSY
40.0000 mg | PREFILLED_SYRINGE | Freq: Every day | INTRAMUSCULAR | Status: DC
  Administered 2023-05-25 – 2023-06-02 (×9): 40 mg via SUBCUTANEOUS
  Filled 2023-05-24 (×9): qty 0.4

## 2023-05-24 MED ORDER — VANCOMYCIN HCL 1750 MG/350ML IV SOLN
1750.0000 mg | Freq: Once | INTRAVENOUS | Status: AC
  Administered 2023-05-24: 1750 mg via INTRAVENOUS
  Filled 2023-05-24: qty 350

## 2023-05-24 MED ORDER — SODIUM CHLORIDE 0.9 % IV SOLN
2.0000 g | Freq: Once | INTRAVENOUS | Status: AC
  Administered 2023-05-24: 2 g via INTRAVENOUS
  Filled 2023-05-24: qty 12.5

## 2023-05-24 MED ORDER — ATORVASTATIN CALCIUM 10 MG PO TABS
10.0000 mg | ORAL_TABLET | Freq: Every day | ORAL | Status: DC
  Administered 2023-05-24 – 2023-06-01 (×9): 10 mg via ORAL
  Filled 2023-05-24 (×9): qty 1

## 2023-05-24 MED ORDER — SODIUM CHLORIDE 0.9 % IV SOLN: INTRAVENOUS | Status: DC

## 2023-05-24 MED ORDER — CARVEDILOL 6.25 MG PO TABS
6.2500 mg | ORAL_TABLET | Freq: Two times a day (BID) | ORAL | Status: DC
  Administered 2023-05-25 – 2023-06-02 (×16): 6.25 mg via ORAL
  Filled 2023-05-24 (×16): qty 1

## 2023-05-24 MED ORDER — HYDROCODONE-ACETAMINOPHEN 5-325 MG PO TABS
1.0000 | ORAL_TABLET | ORAL | Status: DC | PRN
  Administered 2023-05-25: 1 via ORAL
  Administered 2023-05-29 – 2023-05-30 (×3): 2 via ORAL
  Filled 2023-05-24 (×4): qty 2
  Filled 2023-05-24: qty 1

## 2023-05-24 MED ORDER — ACETAMINOPHEN 650 MG RE SUPP: 650.0000 mg | Freq: Four times a day (QID) | RECTAL | Status: DC | PRN

## 2023-05-24 MED ORDER — ONDANSETRON HCL 4 MG/2ML IJ SOLN: 4.0000 mg | Freq: Four times a day (QID) | INTRAMUSCULAR | Status: DC | PRN

## 2023-05-24 MED ORDER — SENNOSIDES-DOCUSATE SODIUM 8.6-50 MG PO TABS
1.0000 | ORAL_TABLET | Freq: Every evening | ORAL | Status: DC | PRN
  Administered 2023-05-31: 1 via ORAL
  Filled 2023-05-24: qty 1

## 2023-05-24 MED ORDER — GABAPENTIN 400 MG PO CAPS: 400.0000 mg | ORAL_CAPSULE | Freq: Three times a day (TID) | ORAL | Status: DC

## 2023-05-24 MED ORDER — MOMETASONE FURO-FORMOTEROL FUM 200-5 MCG/ACT IN AERO
2.0000 | INHALATION_SPRAY | Freq: Two times a day (BID) | RESPIRATORY_TRACT | Status: DC
  Administered 2023-05-25 – 2023-06-02 (×14): 2 via RESPIRATORY_TRACT
  Filled 2023-05-24 (×2): qty 8.8

## 2023-05-24 MED ORDER — HYDRALAZINE HCL 20 MG/ML IJ SOLN: 5.0000 mg | Freq: Three times a day (TID) | INTRAMUSCULAR | Status: DC | PRN

## 2023-05-24 MED ORDER — TRAZODONE HCL 50 MG PO TABS
25.0000 mg | ORAL_TABLET | Freq: Every evening | ORAL | Status: DC | PRN
  Administered 2023-05-24: 25 mg via ORAL
  Filled 2023-05-24: qty 1

## 2023-05-24 MED ORDER — DULOXETINE HCL 30 MG PO CPEP
30.0000 mg | ORAL_CAPSULE | Freq: Two times a day (BID) | ORAL | Status: DC
  Administered 2023-05-24 – 2023-06-02 (×18): 30 mg via ORAL
  Filled 2023-05-24 (×18): qty 1

## 2023-05-24 NOTE — ED Provider Notes (Signed)
[place] EMERGENCY DEPARTMENT AT [facility] Provider Note   CSN: [id number] Arrival date & time: 05/24/23  1548     History Chief Complaint  Patient presents with   Wound Infection   Arm Swelling    HPI Justin Snow is a 67 y.o. male presenting for acute on chronic arm swelling.  He is well-appearing in no acute distress.States that he was recently admitted for similar which is consistent with chart review.  States that it is swollen up even more and become more exquisitely painful.  Endorses subjective fevers Per chart review, he had a course of Vanco and Unasyn inpatient transition to Augmentin and Doxy.  Patient's recorded medical, surgical, social, medication list and allergies were reviewed in the Snapshot window as part of the initial history.   Review of Systems   Review of Systems  Constitutional:  Negative for chills and fever.  HENT:  Negative for ear pain and sore throat.   Eyes:  Negative for pain and visual disturbance.  Respiratory:  Negative for cough and shortness of breath.   Cardiovascular:  Negative for chest pain and palpitations.  Gastrointestinal:  Negative for abdominal pain and vomiting.  Genitourinary:  Negative for dysuria and hematuria.  Musculoskeletal:  Negative for arthralgias and back pain.  Skin:  Positive for rash. Negative for color change.  Neurological:  Negative for seizures and syncope.  All other systems reviewed and are negative.   Physical Exam Updated Vital Signs BP (!) 146/60 (BP Location: Right Arm)   Pulse 71   Temp 97.8 F (36.6 C) (Oral)   Resp 17   Ht 5\' 7"  (1.702 m)   Wt 99.8 kg   SpO2 100%   BMI 34.46 kg/m  Physical Exam Vitals and nursing note reviewed.  Constitutional:      General: He is not in acute distress.    Appearance: He is well-developed.  HENT:     Head: Normocephalic and atraumatic.  Eyes:     Conjunctiva/sclera: Conjunctivae normal.  Cardiovascular:     Rate and Rhythm: Normal  rate and regular rhythm.     Heart sounds: No murmur heard. Pulmonary:     Effort: Pulmonary effort is normal. No respiratory distress.     Breath sounds: Normal breath sounds.  Abdominal:     Palpations: Abdomen is soft.     Tenderness: There is no abdominal tenderness.  Musculoskeletal:        General: Deformity and signs of injury present. No swelling.     Cervical back: Neck supple.  Skin:    General: Skin is warm and dry.     Capillary Refill: Capillary refill takes less than 2 seconds.     Findings: Rash present.  Neurological:     Mental Status: He is alert.  Psychiatric:        Mood and Affect: Mood normal.      ED Course/ Medical Decision Making/ A&P    Procedures Procedures   Medications Ordered in ED Medications  acetaminophen (TYLENOL) tablet 1,000 mg (has no administration in time range)  amLODipine (NORVASC) tablet 10 mg (has no administration in time range)  atorvastatin (LIPITOR) tablet 10 mg (has no administration in time range)  carvedilol (COREG) tablet 6.25 mg (has no administration in time range)  hydrochlorothiazide (HYDRODIURIL) tablet 12.5 mg (has no administration in time range)  lisinopril (ZESTRIL) tablet 40 mg (has no administration in time range)  DULoxetine (CYMBALTA) DR capsule 30 mg (has no administration in  time range)  insulin glargine-yfgn (SEMGLEE) injection 15 Units (has no administration in time range)  pantoprazole (PROTONIX) EC tablet 40 mg (has no administration in time range)  gabapentin (NEURONTIN) capsule 400 mg (has no administration in time range)  potassium chloride (KLOR-CON M) CR tablet 10 mEq (has no administration in time range)  albuterol (PROVENTIL) (2.5 MG/3ML) 0.083% nebulizer solution 2.5 mg (has no administration in time range)  mometasone-formoterol (DULERA) 200-5 MCG/ACT inhaler 2 puff (has no administration in time range)  enoxaparin (LOVENOX) injection 40 mg (has no administration in time range)  sodium chloride  flush (NS) 0.9 % injection 3 mL (has no administration in time range)  acetaminophen (TYLENOL) tablet 650 mg (has no administration in time range)    Or  acetaminophen (TYLENOL) suppository 650 mg (has no administration in time range)  HYDROcodone-acetaminophen (NORCO/VICODIN) 5-325 MG per tablet 1-2 tablet (has no administration in time range)  morphine (PF) 2 MG/ML injection 1 mg (has no administration in time range)  traZODone (DESYREL) tablet 25 mg (has no administration in time range)  senna-docusate (Senokot-S) tablet 1 tablet (has no administration in time range)  bisacodyl (DULCOLAX) EC tablet 5 mg (has no administration in time range)  ondansetron (ZOFRAN) tablet 4 mg (has no administration in time range)    Or  ondansetron (ZOFRAN) injection 4 mg (has no administration in time range)  hydrALAZINE (APRESOLINE) injection 5 mg (has no administration in time range)  insulin aspart (novoLOG) injection 0-15 Units (has no administration in time range)  insulin aspart (novoLOG) injection 0-5 Units (has no administration in time range)  0.9 %  sodium chloride infusion (has no administration in time range)  ceFEPIme (MAXIPIME) 2 g in sodium chloride 0.9 % 100 mL IVPB (0 g Intravenous Stopped 05/24/23 2013)  vancomycin (VANCOREADY) IVPB 1750 mg/350 mL (1,750 mg Intravenous New Bag/Given 05/24/23 2014)    Medical Decision Making:    Justin Snow is a 67 y.o. male who presented to the ED today with worsening left arm swelling in the outpatient setting detailed above.      Complete initial physical exam performed, notably the patient  was hemodynamically stable no acute distress.  See picture above for left arm deformity.      Reviewed and confirmed nursing documentation for past medical history, family history, social history.    Initial Assessment:   With the patient's presentation of worsening left arm swelling, most likely diagnosis is cellulitis versus developing vascular insult.  Other diagnoses were considered including (but not limited to) arterial injury, necrotizing fasciitis, septic arthritis. These are considered less likely due to history of present illness and physical exam findings.   This is most consistent with an acute life/limb threatening illness complicated by underlying chronic conditions. In particular septic arthritis is considered less likely as patient has full range of motion of both the elbow and the shoulder. Initial Plan:   Screening labs including CBC and Metabolic panel to evaluate for infectious or metabolic etiology of disease.  CXR to evaluate for structural/infectious intrathoracic pathology.  Upper extremity DVT study Objective evaluation as below reviewed with plan for close reassessment  Initial Study Results:   Laboratory  All laboratory results reviewed without evidence of clinically relevant pathology.   Exceptions include: leukocytosis    Radiology  All images reviewed independently. Agree with radiology report at this time.   DG Elbow Complete Left  Result Date: 05/24/2023 CLINICAL DATA:  Arm swelling, infection/cellulitis, pain EXAM: LEFT ELBOW - COMPLETE 3+  VIEW COMPARISON:  05/15/2023 FINDINGS: Diffuse extremity soft tissue swelling/edema. Limited exam because of positioning. No acute osseous finding, fracture or definite large effusion. No acute joint abnormality. IMPRESSION: Diffuse soft tissue swelling/edema. No acute osseous finding by plain radiography. Electronically Signed   By: Judie Petit.  Shick M.D.   On: 05/24/2023 17:46     Consults:  Case discussed with hospitalist.   Reassessment and Plan:   Patient arranged for admission for treatment of his cellulitis with broad-spectrum antibiosis with anticipation the patient will require infectious disease consultation.  Discussed with hospitalist who is agreed with need for admission.   Disposition:   Based on the above findings, I believe this patient is stable for admission.     Patient/family educated about specific findings on our evaluation and explained exact reasons for admission.  Patient/family educated about clinical situation and time was allowed to answer questions.   Admission team communicated with and agreed with need for admission. Patient admitted. Patient  ready to move at this time.     Emergency Department Medication Summary:   Medications  acetaminophen (TYLENOL) tablet 1,000 mg (has no administration in time range)  amLODipine (NORVASC) tablet 10 mg (has no administration in time range)  atorvastatin (LIPITOR) tablet 10 mg (has no administration in time range)  carvedilol (COREG) tablet 6.25 mg (has no administration in time range)  hydrochlorothiazide (HYDRODIURIL) tablet 12.5 mg (has no administration in time range)  lisinopril (ZESTRIL) tablet 40 mg (has no administration in time range)  DULoxetine (CYMBALTA) DR capsule 30 mg (has no administration in time range)  insulin glargine-yfgn (SEMGLEE) injection 15 Units (has no administration in time range)  pantoprazole (PROTONIX) EC tablet 40 mg (has no administration in time range)  gabapentin (NEURONTIN) capsule 400 mg (has no administration in time range)  potassium chloride (KLOR-CON M) CR tablet 10 mEq (has no administration in time range)  albuterol (PROVENTIL) (2.5 MG/3ML) 0.083% nebulizer solution 2.5 mg (has no administration in time range)  mometasone-formoterol (DULERA) 200-5 MCG/ACT inhaler 2 puff (has no administration in time range)  enoxaparin (LOVENOX) injection 40 mg (has no administration in time range)  sodium chloride flush (NS) 0.9 % injection 3 mL (has no administration in time range)  acetaminophen (TYLENOL) tablet 650 mg (has no administration in time range)    Or  acetaminophen (TYLENOL) suppository 650 mg (has no administration in time range)  HYDROcodone-acetaminophen (NORCO/VICODIN) 5-325 MG per tablet 1-2 tablet (has no administration in time range)  morphine (PF) 2  MG/ML injection 1 mg (has no administration in time range)  traZODone (DESYREL) tablet 25 mg (has no administration in time range)  senna-docusate (Senokot-S) tablet 1 tablet (has no administration in time range)  bisacodyl (DULCOLAX) EC tablet 5 mg (has no administration in time range)  ondansetron (ZOFRAN) tablet 4 mg (has no administration in time range)    Or  ondansetron (ZOFRAN) injection 4 mg (has no administration in time range)  hydrALAZINE (APRESOLINE) injection 5 mg (has no administration in time range)  insulin aspart (novoLOG) injection 0-15 Units (has no administration in time range)  insulin aspart (novoLOG) injection 0-5 Units (has no administration in time range)  0.9 %  sodium chloride infusion (has no administration in time range)  ceFEPIme (MAXIPIME) 2 g in sodium chloride 0.9 % 100 mL IVPB (0 g Intravenous Stopped 05/24/23 2013)  vancomycin (VANCOREADY) IVPB 1750 mg/350 mL (1,750 mg Intravenous New Bag/Given 05/24/23 2014)         Clinical Impression:  1.  Cellulitis, unspecified cellulitis site      Admit   Final Clinical Impression(s) / ED Diagnoses Final diagnoses:  Cellulitis, unspecified cellulitis site    Rx / DC Orders ED Discharge Orders     None         Glyn Ade, MD 05/24/23 2252

## 2023-05-24 NOTE — ED Notes (Signed)
Vancomycin requested from pharmacy. Waiting for response

## 2023-05-24 NOTE — ED Triage Notes (Signed)
Pt coming in for left arm infection/swelling for the past week. Pt unsure the cause. Arm is red, and swollen down to his hand.

## 2023-05-24 NOTE — H&P (Signed)
History and Physical   [facility] - [place] [facility] Admission History and Physical [employer], D.O.    Patient Name: Justin Snow MR#: [id number] Date of Birth: 07/10/1956 Date of Admission: 05/24/2023  Referring MD/NP/PA: Dr. Doran Durand Primary Care Physician: Pcp, No  Chief Complaint:  Chief Complaint  Patient presents with   Wound Infection   Arm Swelling    HPI: Justin Snow is a 67 y.o. male with a known history of asthma, diabetes, hypertension, CKD stage IIIa, CVA presents to the emergency department for evaluation of arm swelling.    Of note [facility] after being treated for cellulitis of the left forearm-completed Vanco and Unasyn and then transition to Augmentin and Doxy.  He returns today with worsening left upper extremity swelling redness and pain  Patient denies fevers/chills, weakness, dizziness, chest pain, shortness of breath, N/V/C/D, abdominal pain, dysuria/frequency, changes in mental status.    Otherwise there has been no change in status. Patient has been taking medication as prescribed and there has been no recent change in medication or diet.  No recent antibiotics.  There has been no recent illness, hospitalizations, travel or sick contacts.    EMS/ED Course: Patient received cefepime, Vanco, acetaminophen. Medical admission has been requested for further management of left upper extremity swelling concerning for recurrent cellulitis  Review of Systems:  CONSTITUTIONAL: No fever/chills, fatigue, weakness, weight gain/loss, headache. EYES: No blurry or double vision. ENT: No tinnitus, postnasal drip, redness or soreness of the oropharynx. RESPIRATORY: No cough, dyspnea, wheeze.  No hemoptysis.  CARDIOVASCULAR: No chest pain, palpitations, syncope, orthopnea. No lower extremity edema.  GASTROINTESTINAL: No nausea, vomiting, abdominal pain, diarrhea, constipation.  No hematemesis, melena or  hematochezia. GENITOURINARY: No dysuria, frequency, hematuria. ENDOCRINE: No polyuria or nocturia. No heat or cold intolerance. HEMATOLOGY: No anemia, bruising, bleeding. INTEGUMENTARY: No rashes, ulcers, lesions. MUSCULOSKELETAL: Positive left upper arm swelling redness and pain no arthritis, gout. NEUROLOGIC: No numbness, tingling, ataxia, seizure-type activity, weakness. PSYCHIATRIC: No anxiety, depression, insomnia.   Past Medical History:  Diagnosis Date   Asthma    Diabetes mellitus without complication (HCC)    Hypertension     History reviewed. No pertinent surgical history.   reports that he has been smoking cigarettes. He has never used smokeless tobacco. He reports that he does not currently use alcohol. He reports that he does not currently use drugs.  No Known Allergies  Family History  Problem Relation Age of Onset   Heart disease Father     Prior to Admission medications   Medication Sig Start Date End Date Taking? Authorizing Provider  albuterol (VENTOLIN HFA) 108 (90 Base) MCG/ACT inhaler Inhale into the lungs every 6 (six) hours as needed for wheezing or shortness of breath.    [provider]  amLODipine (NORVASC) 10 MG tablet Take 1 tablet (10 mg total) by mouth daily. 04/27/23   Amin, Ankit C, MD  amoxicillin-clavulanate (AUGMENTIN) 875-125 MG tablet Take 1 tablet by mouth every 12 (twelve) hours for 7 days. 05/17/23 05/24/23  Lewie Chamber, MD  atorvastatin (LIPITOR) 10 MG tablet Take 1 tablet (10 mg total) by mouth at bedtime. 04/27/23   Amin, Ankit C, MD  Blood Glucose Monitoring Suppl (BLOOD GLUCOSE MONITOR SYSTEM) w/Device KIT Use 3 (three) times daily. 04/27/23   Amin, Ankit C, MD  carvedilol (COREG) 6.25 MG tablet Take 1 tablet (6.25 mg total) by mouth 2 (two) times daily. 04/27/23   Amin, Ankit  C, MD  doxycycline (VIBRA-TABS) 100 MG tablet Take 1 tablet (100 mg total) by mouth every 12 (twelve) hours for 7 days. 05/17/23 05/24/23  Lewie Chamber, MD   DULoxetine (CYMBALTA) 30 MG capsule Take 1 capsule (30 mg total) by mouth 2 (two) times daily. 04/27/23   Miguel Rota, MD  fluticasone-salmeterol (ADVAIR HFA) 782-95 MCG/ACT inhaler Inhale 2 puffs into the lungs 2 (two) times daily.    [provider]  gabapentin (NEURONTIN) 400 MG capsule Take 400 mg by mouth 3 (three) times daily. 05/25/20   [provider]  Glucagon (GVOKE HYPOPEN 2-PACK) 1 MG/0.2ML SOAJ Inject 1 mg into the skin as needed for up to 2 doses (Severe low blood sugar). 04/27/23   Amin, Ankit C, MD  Glucose Blood (BLOOD GLUCOSE TEST STRIPS) STRP Use as directed to check blood sugar three times daily 04/27/23   Amin, Ankit C, MD  hydrochlorothiazide (HYDRODIURIL) 12.5 MG tablet Take 1 tablet (12.5 mg total) by mouth daily. 04/28/23   Amin, Ankit C, MD  insulin aspart (NOVOLOG) 100 UNIT/ML FlexPen Inject 0-6 Units into the skin 3 (three) times daily with meals. Check Blood Glucose (BG) and inject per scale: BG <150= 0 unit; BG 150-200= 1 unit; BG 201-250= 2 unit; BG 251-300= 3 unit; BG 301-350= 4 unit; BG 351-400= 5 unit; BG >400= 6 unit and Call Primary Care. 04/27/23   Amin, Ankit C, MD  insulin glargine-yfgn (SEMGLEE, YFGN,) 100 UNIT/ML Pen Inject 15 Units into the skin daily. 04/27/23   Amin, Ankit C, MD  Insulin Pen Needle 31G X 8 MM MISC Use 3 (three) times daily. 04/27/23   Amin, Ankit C, MD  Insulin Syringe-Needle U-100 (INSULIN SYRINGE .5CC/30GX1/2") 30G X 1/2" 0.5 ML MISC Use as directed with insulin. 10/18/19   Mardella Layman, MD  Lancet Device MISC Use 3 (three) times daily. May dispense any manufacturer covered by patient's insurance 04/27/23   Amin, Ankit C, MD  lisinopril (ZESTRIL) 40 MG tablet Take 1 tablet (40 mg total) by mouth daily. 04/27/23   Amin, Ankit C, MD  metFORMIN (GLUCOPHAGE) 1000 MG tablet Take 1 tablet (1,000 mg total) by mouth 2 (two) times daily. 04/27/23 07/26/23  Miguel Rota, MD  omeprazole (PRILOSEC) 20 MG capsule Take 1 capsule (20 mg total)  by mouth every morning. 04/27/23   Amin, Ankit C, MD  potassium chloride (MICRO-K) 10 MEQ CR capsule Take 10 mEq by mouth daily. 05/12/20   [provider]  TRUEplus Lancets 28G MISC Use 3 (three) times daily as directed to check blood sugar 04/27/23   Miguel Rota, MD    Physical Exam: Vitals:   05/24/23 1550 05/24/23 1551 05/24/23 1928  BP: 124/65    Pulse: 83    Resp: 16    Temp:  97.8 F (36.6 C)   TempSrc:  Oral   SpO2: 100%    Weight:   99.8 kg  Height:   5\' 7"  (1.702 m)    GENERAL: 67 y.o.-year-old male patient, well-developed, well-nourished lying in the bed in no acute distress.  Pleasant and cooperative.   HEENT: Head atraumatic, normocephalic. Pupils equal. Mucus membranes moist. NECK: Supple. No JVD. CHEST: Normal breath sounds bilaterally. No wheezing, rales, rhonchi or crackles. No use of accessory muscles of respiration.  No reproducible chest wall tenderness.  CARDIOVASCULAR: S1, S2 normal. No murmurs, rubs, or gallops. Cap refill <2 seconds. Pulses intact distally.  ABDOMEN: Soft, nondistended, nontender. No rebound, guarding, rigidity. Normoactive bowel sounds  present in all four quadrants.  EXTREMITIES: No pedal edema, cyanosis, or clubbing. No calf tenderness or Homan's sign.  NEUROLOGIC: The patient is alert and oriented x 3. Cranial nerves II through XII are grossly intact with no focal sensorimotor deficit. PSYCHIATRIC:  Normal affect, mood, thought content. SKIN: Warm, dry, and intact without obvious rash, lesion, or ulcer.    Labs on Admission:  CBC: Recent Labs  Lab 05/24/23 1705  WBC 20.5*  NEUTROABS 16.9*  HGB 11.8*  HCT 35.5*  MCV 85.5  PLT 388   Basic Metabolic Panel: Recent Labs  Lab 05/24/23 1705  NA 134*  K 3.9  CL 101  CO2 23  GLUCOSE 155*  BUN 26*  CREATININE 1.51*  CALCIUM 8.2*   GFR: Estimated Creatinine Clearance: 52 mL/min (A) (by C-G formula based on SCr of 1.51 mg/dL (H)). Liver Function Tests: Recent Labs   Lab 05/24/23 1705  AST 17  ALT 14  ALKPHOS 114  BILITOT 0.8  PROT 5.9*  ALBUMIN 2.1*   No results for input(s): "LIPASE", "AMYLASE" in the last 168 hours. No results for input(s): "AMMONIA" in the last 168 hours. Coagulation Profile: No results for input(s): "INR", "PROTIME" in the last 168 hours. Cardiac Enzymes: No results for input(s): "CKTOTAL", "CKMB", "CKMBINDEX", "TROPONINI" in the last 168 hours. BNP (last 3 results) No results for input(s): "PROBNP" in the last 8760 hours. HbA1C: No results for input(s): "HGBA1C" in the last 72 hours. CBG: No results for input(s): "GLUCAP" in the last 168 hours. Lipid Profile: No results for input(s): "CHOL", "HDL", "LDLCALC", "TRIG", "CHOLHDL", "LDLDIRECT" in the last 72 hours. Thyroid Function Tests: No results for input(s): "TSH", "T4TOTAL", "FREET4", "T3FREE", "THYROIDAB" in the last 72 hours. Anemia Panel: No results for input(s): "VITAMINB12", "FOLATE", "FERRITIN", "TIBC", "IRON", "RETICCTPCT" in the last 72 hours. Urine analysis:    Component Value Date/Time   COLORURINE YELLOW 05/16/2023 0634   APPEARANCEUR HAZY (A) 05/16/2023 0634   LABSPEC 1.017 05/16/2023 0634   PHURINE 5.0 05/16/2023 0634   GLUCOSEU 50 (A) 05/16/2023 0634   HGBUR SMALL (A) 05/16/2023 0634   BILIRUBINUR NEGATIVE 05/16/2023 0634   KETONESUR NEGATIVE 05/16/2023 0634   PROTEINUR >=300 (A) 05/16/2023 0634   NITRITE NEGATIVE 05/16/2023 0634   LEUKOCYTESUR NEGATIVE 05/16/2023 0634   Sepsis Labs: @LABRCNTIP (procalcitonin:4,lacticidven:4) ) Recent Results (from the past 240 hour(s))  Culture, blood (routine x 2)     Status: None   Collection Time: 05/15/23  9:48 PM   Specimen: BLOOD  Result Value Ref Range Status   Specimen Description   Final    BLOOD RIGHT ANTECUBITAL Performed at [facility], [address], [place], [place] [address]    Special Requests   Final    BOTTLES DRAWN AEROBIC AND ANAEROBIC Blood Culture results may  not be optimal due to an excessive volume of blood received in culture bottles   Culture   Final    NO GROWTH 5 DAYS Performed at [facility], [address], [place], [place] [address]    Report Status 05/21/2023 FINAL  Final  Culture, blood (Routine X 2) w Reflex to ID Panel     Status: None   Collection Time: 05/16/23 10:06 AM   Specimen: BLOOD RIGHT HAND  Result Value Ref Range Status   Specimen Description BLOOD RIGHT HAND  Final   Special Requests   Final    BOTTLES DRAWN AEROBIC ONLY Blood Culture adequate volume   Culture   Final  NO GROWTH 5 DAYS Performed at [facility], [address], [place], [place] [address]    Report Status 05/21/2023 FINAL  Final     Radiological Exams on Admission: DG Elbow Complete Left  Result Date: 05/24/2023 CLINICAL DATA:  Arm swelling, infection/cellulitis, pain EXAM: LEFT ELBOW - COMPLETE 3+ VIEW COMPARISON:  05/15/2023 FINDINGS: Diffuse extremity soft tissue swelling/edema. Limited exam because of positioning. No acute osseous finding, fracture or definite large effusion. No acute joint abnormality. IMPRESSION: Diffuse soft tissue swelling/edema. No acute osseous finding by plain radiography. Electronically Signed   By: Judie Petit.  Shick M.D.   On: 05/24/2023 17:46     Assessment/Plan  This is a 67 y.o. male with a history of asthma, diabetes, hypertension, CKD stage IIIa, CVA  now being admitted with:  #.  Left upper extremity swelling consistent with cellulitis -Admit observation - Check left upper extremity Doppler to evaluate for DVT - Continue cefepime and Vanco - Would appreciate ID consult on this case considering failure of broad-spectrum antibiotics  #.  History of asthma -Continue Advair - Continue O2 and nebs as needed  #. H/o Diabetes - Accuchecks achs with RISS coverage - Heart healthy, carb controlled diet -Continue insulin and Lantus   #. History of CKD -Stable, monitor BMP  #. History of  hypertension - Continue Norvasc, Coreg, hydrochlorothiazide, lisinopril  #. History of CVA - Continue atorvastatin  #.  Continue Cymbalta and gabapentin  Admission status: OBSERVATION IV Fluids: Hep-Lock Diet/Nutrition: Heart healthy, carb controlled Consults called: ID DVT Px: Lovenox, SCDs and early ambulation. Code Status: Full Code  Disposition Plan: To home in less than 24 hours  All the records are reviewed and case discussed with ED provider. Management plans discussed with the patient and/or family who express understanding and agree with plan of care.  Marlena Barbato D.O. on 05/24/2023 at 8:56 PM CC: Primary care physician; Pcp, No   05/24/2023, 8:56 PM

## 2023-05-24 NOTE — ED Provider Triage Note (Signed)
Emergency Medicine Provider Triage Evaluation Note  Justin Snow , a 67 y.o. male  was evaluated in triage.  Pt complains of wound infection.  Patient is unsure exactly how long the left elbow has been swollen but reports difficulty with mobility in the left elbow.  Reports subjective fevers and chills as well.  Patient is currently an inmate.  Not on any antibiotics.  Endorses some purulent drainage coming from multiple areas of macerated skin on the left elbow.  Review of Systems  Positive: As above Negative: As above  Physical Exam  BP 124/65 (BP Location: Right Arm)   Pulse 83   Temp 97.8 F (36.6 C) (Oral)   Resp 16   SpO2 100%  Gen:   Awake, no distress   Resp:  Normal effort MSK:   Moves extremities without difficulty  Other:  Multiple areas of skin breakdown with notable cellulitis of the left elbow extending to the left wrist. Warm to the touch.  Medical Decision Making  Medically screening exam initiated at 4:34 PM.  Appropriate orders placed.  Justin Snow was informed that the remainder of the evaluation will be completed by another provider, this initial triage assessment does not replace that evaluation, and the importance of remaining in the ED until their evaluation is complete.     Smitty Knudsen, PA-C 05/24/23 [phone]

## 2023-05-24 NOTE — Progress Notes (Signed)
ED Pharmacy Antibiotic Sign Off An antibiotic consult was received from an ED provider for vancomycin per pharmacy dosing for wound infection. A chart review was completed to assess appropriateness.   The following one time order(s) were placed:  Vancomycin 1750 mg IV x 1  Further antibiotic and/or antibiotic pharmacy consults should be ordered by the admitting provider if indicated.   Thank you for allowing pharmacy to be a part of this patient's care.   Daylene Posey, [facility]  Clinical Pharmacist 05/24/23 6:59 PM

## 2023-05-24 NOTE — ED Notes (Signed)
ED TO INPATIENT HANDOFF REPORT  ED Nurse Name and Phone #: Rodney Booze [phone]  S Name/Age/Gender Justin Snow 67 y.o. male Room/Bed: 005C/005C  Code Status   Code Status: Full Code  Home/SNF/Other Home Patient oriented to: self, place, time, and situation Is this baseline? Yes   Triage Complete: Triage complete  Chief Complaint Left arm cellulitis [L03.114]  Triage Note Pt coming in for left arm infection/swelling for the past week. Pt unsure the cause. Arm is red, and swollen down to his hand.   Allergies No Known Allergies  Level of Care/Admitting Diagnosis ED Disposition     ED Disposition  Admit   Condition  --   Comment  Hospital Area: [facility] [[id number]]  Level of Care: Med-Surg [16]  May place patient in observation at [facility] or [facility] if equivalent level of care is available:: No  Covid Evaluation: Asymptomatic - no recent exposure (last 10 days) testing not required  Diagnosis: Left arm cellulitis [829562]  Admitting Physician: Tonye Royalty [[id number]]  Attending Physician: Tonye Royalty [[id number]]          B Medical/Surgery History Past Medical History:  Diagnosis Date   Asthma    Diabetes mellitus without complication (HCC)    Hypertension    History reviewed. No pertinent surgical history.   A IV Location/Drains/Wounds Patient Lines/Drains/Airways Status     Active Line/Drains/Airways     Name Placement date Placement time Site Days   Peripheral IV 05/24/23 20 G Anterior;Distal;Right;Upper Arm 05/24/23  1704  Arm  less than 1            Intake/Output Last 24 hours  Intake/Output Summary (Last 24 hours) at 05/24/2023 2136 Last data filed at 05/24/2023 2013 Gross per 24 hour  Intake 100 ml  Output --  Net 100 ml    Labs/Imaging Results for orders placed or performed during the hospital encounter of 05/24/23 (from the past 48 hour(s))  CBC with Differential     Status: Abnormal    Collection Time: 05/24/23  5:05 PM  Result Value Ref Range   WBC 20.5 (H) 4.0 - 10.5 K/uL   RBC 4.15 (L) 4.22 - 5.81 MIL/uL   Hemoglobin 11.8 (L) 13.0 - 17.0 g/dL   HCT 52.8 (L) 41.3 - 24.4 %   MCV 85.5 80.0 - 100.0 fL   MCH 28.4 26.0 - 34.0 pg   MCHC 33.2 30.0 - 36.0 g/dL   RDW 01.0 27.2 - 53.6 %   Platelets 388 150 - 400 K/uL   nRBC 0.0 0.0 - 0.2 %   Neutrophils Relative % 82 %   Neutro Abs 16.9 (H) 1.7 - 7.7 K/uL   Lymphocytes Relative 10 %   Lymphs Abs 2.0 0.7 - 4.0 K/uL   Monocytes Relative 6 %   Monocytes Absolute 1.1 (H) 0.1 - 1.0 K/uL   Eosinophils Relative 0 %   Eosinophils Absolute 0.1 0.0 - 0.5 K/uL   Basophils Relative 0 %   Basophils Absolute 0.1 0.0 - 0.1 K/uL   Immature Granulocytes 2 %   Abs Immature Granulocytes 0.30 (H) 0.00 - 0.07 K/uL    Comment: Performed at [facility], [address], [place], [place] [address]  Comprehensive metabolic panel     Status: Abnormal   Collection Time: 05/24/23  5:05 PM  Result Value Ref Range   Sodium 134 (L) 135 - 145 mmol/L   Potassium 3.9 3.5 - 5.1 mmol/L   Chloride 101 98 -  111 mmol/L   CO2 23 22 - 32 mmol/L   Glucose, Bld 155 (H) 70 - 99 mg/dL    Comment: Glucose reference range applies only to samples taken after fasting for at least 8 hours.   BUN 26 (H) 8 - 23 mg/dL   Creatinine, Ser 7.82 (H) 0.61 - 1.24 mg/dL   Calcium 8.2 (L) 8.9 - 10.3 mg/dL   Total Protein 5.9 (L) 6.5 - 8.1 g/dL   Albumin 2.1 (L) 3.5 - 5.0 g/dL   AST 17 15 - 41 U/L   ALT 14 0 - 44 U/L   Alkaline Phosphatase 114 38 - 126 U/L   Total Bilirubin 0.8 0.3 - 1.2 mg/dL   GFR, Estimated 50 (L) >60 mL/min    Comment: (NOTE) Calculated using the CKD-EPI Creatinine Equation (2021)    Anion gap 10 5 - 15    Comment: Performed at [facility], [address] [place], [place] [address]  I-Stat CG4 Lactic Acid     Status: None   Collection Time: 05/24/23  5:12 PM  Result Value Ref Range   Lactic Acid, Venous 1.0 0.5 - 1.9 mmol/L   I-Stat CG4 Lactic Acid     Status: None   Collection Time: 05/24/23  7:10 PM  Result Value Ref Range   Lactic Acid, Venous 1.7 0.5 - 1.9 mmol/L   DG Elbow Complete Left  Result Date: 05/24/2023 CLINICAL DATA:  Arm swelling, infection/cellulitis, pain EXAM: LEFT ELBOW - COMPLETE 3+ VIEW COMPARISON:  05/15/2023 FINDINGS: Diffuse extremity soft tissue swelling/edema. Limited exam because of positioning. No acute osseous finding, fracture or definite large effusion. No acute joint abnormality. IMPRESSION: Diffuse soft tissue swelling/edema. No acute osseous finding by plain radiography. Electronically Signed   By: Judie Petit.  Shick M.D.   On: 05/24/2023 17:46    Pending Labs Unresulted Labs (From admission, onward)     Start     Ordered   05/24/23 1636  Blood culture (routine x 2)  BLOOD CULTURE X 2,   R (with STAT occurrences)     Question Answer Comment  Patient immune status Normal   Release to patient Immediate      05/24/23 1635   Signed and Held  CBC  (enoxaparin (LOVENOX)    CrCl >/= 30 ml/min)  Once,   R       Comments: Baseline for enoxaparin therapy IF NOT ALREADY DRAWN.  Notify MD if PLT < 100 K.    Signed and Held   Signed and Held  Creatinine, serum  (enoxaparin (LOVENOX)    CrCl >/= 30 ml/min)  Once,   R       Comments: Baseline for enoxaparin therapy IF NOT ALREADY DRAWN.    Signed and Held   Signed and Held  Creatinine, serum  (enoxaparin (LOVENOX)    CrCl >/= 30 ml/min)  Weekly,   R     Comments: while on enoxaparin therapy    Signed and Held   Signed and Held  CBC  Tomorrow morning,   R        Signed and Held   Signed and Held  Basic metabolic panel  Tomorrow morning,   R        Signed and Held            Vitals/Pain Today's Vitals   05/24/23 1550 05/24/23 1551 05/24/23 1555 05/24/23 1928  BP: 124/65     Pulse: 83     Resp: 16  Temp:  97.8 F (36.6 C)    TempSrc:  Oral    SpO2: 100%     Weight:    99.8 kg  Height:    5\' 7"  (1.702 m)  PainSc:   5       Isolation Precautions No active isolations  Medications Medications  vancomycin (VANCOREADY) IVPB 1750 mg/350 mL (1,750 mg Intravenous New Bag/Given 05/24/23 2014)  acetaminophen (TYLENOL) tablet 1,000 mg (has no administration in time range)  ceFEPIme (MAXIPIME) 2 g in sodium chloride 0.9 % 100 mL IVPB (0 g Intravenous Stopped 05/24/23 2013)    Mobility walks     Focused Assessments Cardiac Assessment Handoff:    Lab Results  Component Value Date   CKTOTAL 267 05/15/2023   No results found for: "DDIMER" Does the Patient currently have chest pain? No    R Recommendations: See Admitting Provider Note  Report given to:   Additional Notes:

## 2023-05-25 ENCOUNTER — Inpatient Hospital Stay (HOSPITAL_COMMUNITY): Payer: Medicaid Other

## 2023-05-25 ENCOUNTER — Observation Stay (HOSPITAL_COMMUNITY): Payer: Medicaid Other

## 2023-05-25 DIAGNOSIS — I129 Hypertensive chronic kidney disease with stage 1 through stage 4 chronic kidney disease, or unspecified chronic kidney disease: Secondary | ICD-10-CM | POA: Diagnosis present

## 2023-05-25 DIAGNOSIS — N183 Chronic kidney disease, stage 3 unspecified: Secondary | ICD-10-CM | POA: Diagnosis not present

## 2023-05-25 DIAGNOSIS — L03114 Cellulitis of left upper limb: Secondary | ICD-10-CM | POA: Diagnosis present

## 2023-05-25 DIAGNOSIS — N1831 Chronic kidney disease, stage 3a: Secondary | ICD-10-CM | POA: Diagnosis present

## 2023-05-25 DIAGNOSIS — Z7951 Long term (current) use of inhaled steroids: Secondary | ICD-10-CM | POA: Diagnosis not present

## 2023-05-25 DIAGNOSIS — M71122 Other infective bursitis, left elbow: Secondary | ICD-10-CM | POA: Diagnosis present

## 2023-05-25 DIAGNOSIS — R451 Restlessness and agitation: Secondary | ICD-10-CM | POA: Diagnosis not present

## 2023-05-25 DIAGNOSIS — Z5982 Transportation insecurity: Secondary | ICD-10-CM | POA: Diagnosis not present

## 2023-05-25 DIAGNOSIS — L02412 Cutaneous abscess of left axilla: Secondary | ICD-10-CM | POA: Diagnosis not present

## 2023-05-25 DIAGNOSIS — Z7984 Long term (current) use of oral hypoglycemic drugs: Secondary | ICD-10-CM | POA: Diagnosis not present

## 2023-05-25 DIAGNOSIS — E1165 Type 2 diabetes mellitus with hyperglycemia: Secondary | ICD-10-CM | POA: Diagnosis present

## 2023-05-25 DIAGNOSIS — F17210 Nicotine dependence, cigarettes, uncomplicated: Secondary | ICD-10-CM | POA: Diagnosis present

## 2023-05-25 DIAGNOSIS — Z8249 Family history of ischemic heart disease and other diseases of the circulatory system: Secondary | ICD-10-CM | POA: Diagnosis not present

## 2023-05-25 DIAGNOSIS — Z79899 Other long term (current) drug therapy: Secondary | ICD-10-CM | POA: Diagnosis not present

## 2023-05-25 DIAGNOSIS — E1122 Type 2 diabetes mellitus with diabetic chronic kidney disease: Secondary | ICD-10-CM | POA: Diagnosis present

## 2023-05-25 DIAGNOSIS — Z5941 Food insecurity: Secondary | ICD-10-CM | POA: Diagnosis not present

## 2023-05-25 DIAGNOSIS — J45909 Unspecified asthma, uncomplicated: Secondary | ICD-10-CM | POA: Diagnosis present

## 2023-05-25 DIAGNOSIS — L02414 Cutaneous abscess of left upper limb: Secondary | ICD-10-CM | POA: Diagnosis present

## 2023-05-25 DIAGNOSIS — Z781 Physical restraint status: Secondary | ICD-10-CM | POA: Diagnosis not present

## 2023-05-25 DIAGNOSIS — Z8673 Personal history of transient ischemic attack (TIA), and cerebral infarction without residual deficits: Secondary | ICD-10-CM | POA: Diagnosis not present

## 2023-05-25 DIAGNOSIS — M7989 Other specified soft tissue disorders: Secondary | ICD-10-CM | POA: Diagnosis not present

## 2023-05-25 DIAGNOSIS — F32A Depression, unspecified: Secondary | ICD-10-CM | POA: Diagnosis present

## 2023-05-25 DIAGNOSIS — Z794 Long term (current) use of insulin: Secondary | ICD-10-CM | POA: Diagnosis not present

## 2023-05-25 LAB — CBC
HCT: 33.1 % — ABNORMAL LOW (ref 39.0–52.0)
HCT: 37.1 % — ABNORMAL LOW (ref 39.0–52.0)
Hemoglobin: 11.2 g/dL — ABNORMAL LOW (ref 13.0–17.0)
Hemoglobin: 12.5 g/dL — ABNORMAL LOW (ref 13.0–17.0)
MCH: 28.7 pg (ref 26.0–34.0)
MCH: 28.8 pg (ref 26.0–34.0)
MCHC: 33.7 g/dL (ref 30.0–36.0)
MCHC: 33.8 g/dL (ref 30.0–36.0)
MCV: 84.9 fL (ref 80.0–100.0)
MCV: 85.5 fL (ref 80.0–100.0)
Platelets: 321 10*3/uL (ref 150–400)
Platelets: 354 10*3/uL (ref 150–400)
RBC: 3.9 MIL/uL — ABNORMAL LOW (ref 4.22–5.81)
RBC: 4.34 MIL/uL (ref 4.22–5.81)
RDW: 13.4 % (ref 11.5–15.5)
RDW: 13.5 % (ref 11.5–15.5)
WBC: 14.6 10*3/uL — ABNORMAL HIGH (ref 4.0–10.5)
WBC: 19.7 10*3/uL — ABNORMAL HIGH (ref 4.0–10.5)
nRBC: 0 % (ref 0.0–0.2)
nRBC: 0 % (ref 0.0–0.2)

## 2023-05-25 LAB — BASIC METABOLIC PANEL
Anion gap: 13 (ref 5–15)
BUN: 27 mg/dL — ABNORMAL HIGH (ref 8–23)
CO2: 22 mmol/L (ref 22–32)
Calcium: 8.3 mg/dL — ABNORMAL LOW (ref 8.9–10.3)
Chloride: 101 mmol/L (ref 98–111)
Creatinine, Ser: 1.2 mg/dL (ref 0.61–1.24)
GFR, Estimated: >60 mL/min (ref >60)
Glucose, Bld: 154 mg/dL — ABNORMAL HIGH (ref 70–99)
Potassium: 3.6 mmol/L (ref 3.5–5.1)
Sodium: 136 mmol/L (ref 135–145)

## 2023-05-25 LAB — GLUCOSE, CAPILLARY
Glucose-Capillary: 129 mg/dL — ABNORMAL HIGH (ref 70–99)
Glucose-Capillary: 150 mg/dL — ABNORMAL HIGH (ref 70–99)
Glucose-Capillary: 171 mg/dL — ABNORMAL HIGH (ref 70–99)
Glucose-Capillary: 111 mg/dL — ABNORMAL HIGH (ref 70–99)

## 2023-05-25 LAB — CREATININE, SERUM
Creatinine, Ser: 1.52 mg/dL — ABNORMAL HIGH (ref 0.61–1.24)
GFR, Estimated: 49 mL/min — ABNORMAL LOW (ref >60)

## 2023-05-25 MED ORDER — IPRATROPIUM-ALBUTEROL 0.5-2.5 (3) MG/3ML IN SOLN: 3.0000 mL | RESPIRATORY_TRACT | Status: DC | PRN

## 2023-05-25 MED ORDER — ORAL CARE MOUTH RINSE: 15.0000 mL | OROMUCOSAL | Status: DC | PRN

## 2023-05-25 MED ORDER — VANCOMYCIN HCL IN DEXTROSE 1-5 GM/200ML-% IV SOLN
1000.0000 mg | INTRAVENOUS | Status: DC
  Administered 2023-05-25 – 2023-05-26 (×2): 1000 mg via INTRAVENOUS
  Filled 2023-05-25 (×2): qty 200

## 2023-05-25 MED ORDER — VANCOMYCIN HCL IN DEXTROSE 1-5 GM/200ML-% IV SOLN
1000.0000 mg | Freq: Two times a day (BID) | INTRAVENOUS | Status: DC
  Filled 2023-05-25: qty 200

## 2023-05-25 MED ORDER — TRAZODONE HCL 50 MG PO TABS
50.0000 mg | ORAL_TABLET | Freq: Every evening | ORAL | Status: DC | PRN
  Administered 2023-05-25 – 2023-06-01 (×7): 50 mg via ORAL
  Filled 2023-05-25 (×7): qty 1

## 2023-05-25 MED ORDER — IOHEXOL 350 MG/ML SOLN
75.0000 mL | Freq: Once | INTRAVENOUS | Status: AC | PRN
  Administered 2023-05-25: 75 mL via INTRAVENOUS

## 2023-05-25 MED ORDER — METOPROLOL TARTRATE 5 MG/5ML IV SOLN: 5.0000 mg | INTRAVENOUS | Status: DC | PRN

## 2023-05-25 MED ORDER — HYDRALAZINE HCL 20 MG/ML IJ SOLN: 10.0000 mg | INTRAMUSCULAR | Status: DC | PRN

## 2023-05-25 MED ORDER — SODIUM CHLORIDE 0.9 % IV SOLN
2.0000 g | Freq: Two times a day (BID) | INTRAVENOUS | Status: DC
  Administered 2023-05-25 – 2023-06-02 (×17): 2 g via INTRAVENOUS
  Filled 2023-05-25 (×17): qty 12.5

## 2023-05-25 MED ORDER — ALUM & MAG HYDROXIDE-SIMETH 200-200-20 MG/5ML PO SUSP
15.0000 mL | ORAL | Status: DC | PRN
  Administered 2023-05-25: 15 mL via ORAL
  Filled 2023-05-25: qty 30

## 2023-05-25 MED ORDER — GUAIFENESIN 100 MG/5ML PO LIQD: 5.0000 mL | ORAL | Status: DC | PRN

## 2023-05-25 MED ORDER — SODIUM CHLORIDE 0.9% FLUSH
10.0000 mL | INTRAVENOUS | Status: DC | PRN
  Administered 2023-06-02: 10 mL

## 2023-05-25 NOTE — Plan of Care (Signed)

## 2023-05-25 NOTE — Plan of Care (Signed)
  Problem: Pain Managment: Goal: General experience of comfort will improve Outcome: Progressing   Problem: Safety: Goal: Ability to remain free from injury will improve Outcome: Progressing   Problem: Skin Integrity: Goal: Risk for impaired skin integrity will decrease Outcome: Progressing   

## 2023-05-25 NOTE — Progress Notes (Signed)
Pharmacy Antibiotic Note  Justin Snow is a 68 y.o. male admitted on 05/24/2023 with LUE cellulitis.  Pharmacy has been consulted for Vancomycin  dosing.  Vancomycin 1750 mg IV given in ED at  8 pm  Plan: Vancomycin 1000 mg IV q12h  Height: 5\' 7"  (170.2 cm) Weight: 99.8 kg (220 lb 0.3 oz) IBW/kg (Calculated) : 66.1  Temp (24hrs), Avg:97.7 F (36.5 C), Min:97.5 F (36.4 C), Max:97.8 F (36.6 C)  Recent Labs  Lab 05/24/23 1705 05/24/23 1712 05/24/23 1910 05/24/23 2349  WBC 20.5*  --   --  19.7*  CREATININE 1.51*  --   --  1.52*  LATICACIDVEN  --  1.0 1.7  --     Estimated Creatinine Clearance: 51.6 mL/min (A) (by C-G formula based on SCr of 1.52 mg/dL (H)).    No Known Allergies   [name] 05/25/2023 4:08 AM

## 2023-05-25 NOTE — Progress Notes (Signed)
Pharmacy Antibiotic Note  Justin Snow is a 67 y.o. male admitted on 05/24/2023 with worsening LUE cellulitis. Recent hospital discharge on 8/20 with doxycycline and augmentin x 7 days for LUE cellulitis. Pharmacy has been consulted for vancomycin dosing.  WBC 19, afebrile, Scr 1.52 which appears near baseline  Plan: Adjust vancomycin to 1g IV q24 hours (eAUC 501, Scr 1.52, Vd 0.5) Cefepime 2g IV q12 hours per MD Monitor clinical improvement, renal function, ability to narrow antibiotics  Height: 5\' 7"  (170.2 cm) Weight: 99.8 kg (220 lb 0.3 oz) IBW/kg (Calculated) : 66.1  Temp (24hrs), Avg:97.7 F (36.5 C), Min:97.5 F (36.4 C), Max:97.8 F (36.6 C)  Recent Labs  Lab 05/24/23 1705 05/24/23 1712 05/24/23 1910 05/24/23 2349  WBC 20.5*  --   --  19.7*  CREATININE 1.51*  --   --  1.52*  LATICACIDVEN  --  1.0 1.7  --     Estimated Creatinine Clearance: 51.6 mL/min (A) (by C-G formula based on SCr of 1.52 mg/dL (H)).    No Known Allergies  Antimicrobials this admission: Vancomycin 8/27 >>  Cefepime 8/27 >>   Dose adjustments this admission:  Microbiology results: 8/27 BCx: ngtd  Thank you for allowing pharmacy to be a part of this patient's care.  Rexford Maus, PharmD, BCPS 05/25/2023 10:08 AM

## 2023-05-25 NOTE — Progress Notes (Signed)
LUE venous duplex has been completed.   Results can be found under chart review under CV PROC. 05/25/2023 1:11 PM Cashlynn Yearwood RVT, RDMS

## 2023-05-25 NOTE — Progress Notes (Signed)
PROGRESS NOTE    Justin Snow  ZOX:[id number] DOB: 1956-07-10 DOA: 05/24/2023 PCP: Pcp, No   Brief Narrative:  68 year old incarcerated male with history of asthma, DM 2, HTN, CKD stage III A, CVA presents to the ED with complaints of arm swelling.  [facility] after being treated for cellulitis of the left forearm.  At that time patient completed course of vancomycin and Unasyn and eventually transition to Augmentin and doxycycline.  Returns back to the hospital due to worsening pain and swelling.  X-ray of the elbow shows diffuse edema.   Assessment & Plan:  Principal Problem:   Left arm cellulitis   Left upper extremity cellulitis - Currently on IV fluids, vancomycin and cefepime.  Follow culture data.Failed outpatient Abx, still quite severe looking, will get CT LUE to rule out any abscess or deeper inf.   History of asthma - Bronchodilators as needed  Diabetes mellitus type 2 - Diabetic diet.  Sliding scale and Accu-Cheks. -Semglee, sliding scale.  Adjust as necessary  CKD stage IIIa -Creatinine around baseline of 1.5  Essential hypertension -Norvasc, Coreg, HCTZ, lisinopril.  IV as needed  History of CVA -MRI was positive for CVA in July 2024.  On statin  Depression -Cymbalta  DVT prophylaxis: enoxaparin (LOVENOX) injection 40 mg Start: 05/25/23 1000 SCDs Start: 05/24/23 2247 Code Status: Full code Family Communication:   Cont Hosp for IV antibiotics and further evaluation of left lower extremity infection       Diet Orders (From admission, onward)     Start     Ordered   05/24/23 2247  Diet heart healthy/carb modified Room service appropriate? Yes; Fluid consistency: Thin  Diet effective now       Question Answer Comment  Diet-HS Snack? Nothing   Room service appropriate? Yes   Fluid consistency: Thin      05/24/23 2246            Subjective: Still in left upper extremity pain and  swelling Officers present at bedside  Examination:  General exam: Appears calm and comfortable  Respiratory system: Clear to auscultation. Respiratory effort normal. Cardiovascular system: S1 & S2 heard, RRR. No JVD, murmurs, rubs, gallops or clicks. No pedal edema. Gastrointestinal system: Abdomen is nondistended, soft and nontender. No organomegaly or masses felt. Normal bowel sounds heard. Central nervous system: Alert and oriented. No focal neurological deficits. Extremities: Symmetric 5 x 5 power. Skin: Left upper extremity erythema noted with swelling extending from his wrist to his mid arm towards his shoulder Psychiatry: Judgement and insight appear normal. Mood & affect appropriate.  Objective: Vitals:   05/24/23 1551 05/24/23 1928 05/24/23 2249 05/25/23 0333  BP:   (!) 146/60 (!) 133/47  Pulse:   71 67  Resp:   17 17  Temp: 97.8 F (36.6 C)  97.8 F (36.6 C) (!) 97.5 F (36.4 C)  TempSrc: Oral  Oral Oral  SpO2:   100% 100%  Weight:  99.8 kg    Height:  5\' 7"  (1.702 m)      Intake/Output Summary (Last 24 hours) at 05/25/2023 0758 Last data filed at 05/25/2023 8119 Gross per 24 hour  Intake 742.81 ml  Output 100 ml  Net 642.81 ml   Filed Weights   05/24/23 1928  Weight: 99.8 kg    Scheduled Meds:  acetaminophen  1,000 mg Oral Once   amLODipine  10 mg Oral Daily   atorvastatin  10 mg Oral  QHS   carvedilol  6.25 mg Oral BID WC   DULoxetine  30 mg Oral BID   enoxaparin (LOVENOX) injection  40 mg Subcutaneous Daily   gabapentin  400 mg Oral TID   hydrochlorothiazide  12.5 mg Oral Daily   insulin aspart  0-15 Units Subcutaneous TID WC   insulin aspart  0-5 Units Subcutaneous QHS   insulin glargine-yfgn  15 Units Subcutaneous Daily   lisinopril  40 mg Oral Daily   mometasone-formoterol  2 puff Inhalation BID   pantoprazole  40 mg Oral Daily   potassium chloride  10 mEq Oral Daily   sodium chloride flush  3 mL Intravenous Q12H   Continuous Infusions:   sodium chloride 75 mL/hr at 05/25/23 0546   ceFEPime (MAXIPIME) IV     vancomycin      Nutritional status     Body mass index is 34.46 kg/m.  Data Reviewed:   CBC: Recent Labs  Lab 05/24/23 1705 05/24/23 2349  WBC 20.5* 19.7*  NEUTROABS 16.9*  --   HGB 11.8* 11.2*  HCT 35.5* 33.1*  MCV 85.5 84.9  PLT 388 354   Basic Metabolic Panel: Recent Labs  Lab 05/24/23 1705 05/24/23 2349  NA 134*  --   K 3.9  --   CL 101  --   CO2 23  --   GLUCOSE 155*  --   BUN 26*  --   CREATININE 1.51* 1.52*  CALCIUM 8.2*  --    GFR: Estimated Creatinine Clearance: 51.6 mL/min (A) (by C-G formula based on SCr of 1.52 mg/dL (H)). Liver Function Tests: Recent Labs  Lab 05/24/23 1705  AST 17  ALT 14  ALKPHOS 114  BILITOT 0.8  PROT 5.9*  ALBUMIN 2.1*   No results for input(s): "LIPASE", "AMYLASE" in the last 168 hours. No results for input(s): "AMMONIA" in the last 168 hours. Coagulation Profile: No results for input(s): "INR", "PROTIME" in the last 168 hours. Cardiac Enzymes: No results for input(s): "CKTOTAL", "CKMB", "CKMBINDEX", "TROPONINI" in the last 168 hours. BNP (last 3 results) No results for input(s): "PROBNP" in the last 8760 hours. HbA1C: No results for input(s): "HGBA1C" in the last 72 hours. CBG: Recent Labs  Lab 05/24/23 2250  GLUCAP 138*   Lipid Profile: No results for input(s): "CHOL", "HDL", "LDLCALC", "TRIG", "CHOLHDL", "LDLDIRECT" in the last 72 hours. Thyroid Function Tests: No results for input(s): "TSH", "T4TOTAL", "FREET4", "T3FREE", "THYROIDAB" in the last 72 hours. Anemia Panel: No results for input(s): "VITAMINB12", "FOLATE", "FERRITIN", "TIBC", "IRON", "RETICCTPCT" in the last 72 hours. Sepsis Labs: Recent Labs  Lab 05/24/23 1712 05/24/23 1910  LATICACIDVEN 1.0 1.7    Recent Results (from the past 240 hour(s))  Culture, blood (routine x 2)     Status: None   Collection Time: 05/15/23  9:48 PM   Specimen: BLOOD  Result Value Ref  Range Status   Specimen Description   Final    BLOOD RIGHT ANTECUBITAL Performed at [facility], [address] [place], [place] [address]    Special Requests   Final    BOTTLES DRAWN AEROBIC AND ANAEROBIC Blood Culture results may not be optimal due to an excessive volume of blood received in culture bottles   Culture   Final    NO GROWTH 5 DAYS Performed at [facility], [address] [place], [place] [address]    Report Status 05/21/2023 FINAL  Final  Culture, blood (Routine X 2) w Reflex to ID Panel  Status: None   Collection Time: 05/16/23 10:06 AM   Specimen: BLOOD RIGHT HAND  Result Value Ref Range Status   Specimen Description BLOOD RIGHT HAND  Final   Special Requests   Final    BOTTLES DRAWN AEROBIC ONLY Blood Culture adequate volume   Culture   Final    NO GROWTH 5 DAYS Performed at [facility], [address], [place], [place] [address]    Report Status 05/21/2023 FINAL  Final  Blood culture (routine x 2)     Status: None (Preliminary result)   Collection Time: 05/24/23  5:15 PM   Specimen: BLOOD  Result Value Ref Range Status   Specimen Description BLOOD LEFT ANTECUBITAL  Final   Special Requests   Final    BOTTLES DRAWN AEROBIC ONLY Blood Culture adequate volume   Culture   Final    NO GROWTH < 24 HOURS Performed at [facility], [address] [place], [place] [address]    Report Status PENDING  Incomplete  Blood culture (routine x 2)     Status: None (Preliminary result)   Collection Time: 05/24/23  6:57 PM   Specimen: BLOOD RIGHT HAND  Result Value Ref Range Status   Specimen Description BLOOD RIGHT HAND  Final   Special Requests   Final    BOTTLES DRAWN AEROBIC ONLY Blood Culture results may not be optimal due to an inadequate volume of blood received in culture bottles   Culture   Final    NO GROWTH < 12 HOURS Performed at [facility], [address] [place], [place] [address]    Report Status  PENDING  Incomplete         Radiology Studies: DG Elbow Complete Left  Result Date: 05/24/2023 CLINICAL DATA:  Arm swelling, infection/cellulitis, pain EXAM: LEFT ELBOW - COMPLETE 3+ VIEW COMPARISON:  05/15/2023 FINDINGS: Diffuse extremity soft tissue swelling/edema. Limited exam because of positioning. No acute osseous finding, fracture or definite large effusion. No acute joint abnormality. IMPRESSION: Diffuse soft tissue swelling/edema. No acute osseous finding by plain radiography. Electronically Signed   By: Judie Petit.  Shick M.D.   On: 05/24/2023 17:46           LOS: 0 days   Time spent= 35 mins    Miguel Rota, MD [facility]  If 7PM-7AM, please contact night-coverage  05/25/2023, 7:58 AM

## 2023-05-26 ENCOUNTER — Other Ambulatory Visit: Payer: Self-pay

## 2023-05-26 ENCOUNTER — Inpatient Hospital Stay (HOSPITAL_COMMUNITY): Payer: Medicaid Other | Admitting: Certified Registered"

## 2023-05-26 ENCOUNTER — Inpatient Hospital Stay (HOSPITAL_COMMUNITY): Payer: Medicaid Other

## 2023-05-26 ENCOUNTER — Encounter (HOSPITAL_COMMUNITY)

## 2023-05-26 ENCOUNTER — Encounter (HOSPITAL_COMMUNITY): Admission: EM | Payer: Self-pay | Source: Home / Self Care | Attending: Internal Medicine

## 2023-05-26 ENCOUNTER — Encounter (HOSPITAL_COMMUNITY): Payer: Self-pay | Admitting: Family Medicine

## 2023-05-26 DIAGNOSIS — F17210 Nicotine dependence, cigarettes, uncomplicated: Secondary | ICD-10-CM

## 2023-05-26 DIAGNOSIS — N183 Chronic kidney disease, stage 3 unspecified: Secondary | ICD-10-CM

## 2023-05-26 DIAGNOSIS — I129 Hypertensive chronic kidney disease with stage 1 through stage 4 chronic kidney disease, or unspecified chronic kidney disease: Secondary | ICD-10-CM

## 2023-05-26 DIAGNOSIS — L02412 Cutaneous abscess of left axilla: Secondary | ICD-10-CM

## 2023-05-26 DIAGNOSIS — L03114 Cellulitis of left upper limb: Secondary | ICD-10-CM | POA: Diagnosis not present

## 2023-05-26 DIAGNOSIS — E1122 Type 2 diabetes mellitus with diabetic chronic kidney disease: Secondary | ICD-10-CM | POA: Diagnosis not present

## 2023-05-26 HISTORY — PX: I & D EXTREMITY: SHX5045

## 2023-05-26 LAB — CBC
HCT: 33.1 % — ABNORMAL LOW (ref 39.0–52.0)
Hemoglobin: 11 g/dL — ABNORMAL LOW (ref 13.0–17.0)
MCH: 28.4 pg (ref 26.0–34.0)
MCHC: 33.2 g/dL (ref 30.0–36.0)
MCV: 85.3 fL (ref 80.0–100.0)
Platelets: 335 10*3/uL (ref 150–400)
RBC: 3.88 MIL/uL — ABNORMAL LOW (ref 4.22–5.81)
RDW: 13.4 % (ref 11.5–15.5)
WBC: 15.4 10*3/uL — ABNORMAL HIGH (ref 4.0–10.5)
nRBC: 0 % (ref 0.0–0.2)

## 2023-05-26 LAB — BASIC METABOLIC PANEL
Anion gap: 8 (ref 5–15)
BUN: 25 mg/dL — ABNORMAL HIGH (ref 8–23)
CO2: 21 mmol/L — ABNORMAL LOW (ref 22–32)
Calcium: 8 mg/dL — ABNORMAL LOW (ref 8.9–10.3)
Chloride: 104 mmol/L (ref 98–111)
Creatinine, Ser: 1.45 mg/dL — ABNORMAL HIGH (ref 0.61–1.24)
GFR, Estimated: 52 mL/min — ABNORMAL LOW (ref >60)
Glucose, Bld: 140 mg/dL — ABNORMAL HIGH (ref 70–99)
Potassium: 3.4 mmol/L — ABNORMAL LOW (ref 3.5–5.1)
Sodium: 133 mmol/L — ABNORMAL LOW (ref 135–145)

## 2023-05-26 LAB — GLUCOSE, CAPILLARY
Glucose-Capillary: 187 mg/dL — ABNORMAL HIGH (ref 70–99)
Glucose-Capillary: 167 mg/dL — ABNORMAL HIGH (ref 70–99)
Glucose-Capillary: 110 mg/dL — ABNORMAL HIGH (ref 70–99)
Glucose-Capillary: 85 mg/dL (ref 70–99)
Glucose-Capillary: 116 mg/dL — ABNORMAL HIGH (ref 70–99)

## 2023-05-26 LAB — PHOSPHORUS: Phosphorus: 3.8 mg/dL (ref 2.5–4.6)

## 2023-05-26 LAB — HEPATITIS PANEL, ACUTE
HCV Ab: REACTIVE — AB
Hep A IgM: NONREACTIVE
Hep B C IgM: NONREACTIVE
Hepatitis B Surface Ag: NONREACTIVE

## 2023-05-26 LAB — C-REACTIVE PROTEIN: CRP: 2.7 mg/dL — ABNORMAL HIGH (ref <1.0)

## 2023-05-26 LAB — MAGNESIUM: Magnesium: 1.6 mg/dL — ABNORMAL LOW (ref 1.7–2.4)

## 2023-05-26 LAB — SEDIMENTATION RATE: Sed Rate: 12 mm/hr (ref 0–16)

## 2023-05-26 SURGERY — IRRIGATION AND DEBRIDEMENT EXTREMITY
Anesthesia: General | Laterality: Left

## 2023-05-26 MED ORDER — PROMETHAZINE HCL 25 MG/ML IJ SOLN: 6.2500 mg | INTRAMUSCULAR | Status: DC | PRN

## 2023-05-26 MED ORDER — PHENYLEPHRINE 80 MCG/ML (10ML) SYRINGE FOR IV PUSH (FOR BLOOD PRESSURE SUPPORT)
PREFILLED_SYRINGE | INTRAVENOUS | Status: AC
  Filled 2023-05-26: qty 10

## 2023-05-26 MED ORDER — CHLORHEXIDINE GLUCONATE 0.12 % MT SOLN
15.0000 mL | Freq: Once | OROMUCOSAL | Status: AC
  Administered 2023-05-26: 15 mL via OROMUCOSAL
  Filled 2023-05-26: qty 15

## 2023-05-26 MED ORDER — ONDANSETRON HCL 4 MG/2ML IJ SOLN
INTRAMUSCULAR | Status: AC
  Filled 2023-05-26: qty 2

## 2023-05-26 MED ORDER — POVIDONE-IODINE 10 % EX SWAB: 2.0000 | Freq: Once | CUTANEOUS | Status: DC

## 2023-05-26 MED ORDER — VASOPRESSIN 20 UNIT/ML IV SOLN
INTRAVENOUS | Status: DC | PRN
  Administered 2023-05-26 (×3): 1 [IU] via INTRAVENOUS

## 2023-05-26 MED ORDER — ONDANSETRON HCL 4 MG/2ML IJ SOLN
INTRAMUSCULAR | Status: DC | PRN
  Administered 2023-05-26: 4 mg via INTRAVENOUS

## 2023-05-26 MED ORDER — OXYCODONE HCL 5 MG/5ML PO SOLN: 5.0000 mg | Freq: Once | ORAL | Status: DC | PRN

## 2023-05-26 MED ORDER — LIDOCAINE 2% (20 MG/ML) 5 ML SYRINGE
INTRAMUSCULAR | Status: AC
  Filled 2023-05-26: qty 5

## 2023-05-26 MED ORDER — VASOPRESSIN 20 UNIT/ML IV SOLN
INTRAVENOUS | Status: AC
  Filled 2023-05-26: qty 1

## 2023-05-26 MED ORDER — SUCCINYLCHOLINE CHLORIDE 200 MG/10ML IV SOSY
PREFILLED_SYRINGE | INTRAVENOUS | Status: AC
  Filled 2023-05-26: qty 10

## 2023-05-26 MED ORDER — CEFAZOLIN SODIUM-DEXTROSE 2-4 GM/100ML-% IV SOLN
2.0000 g | INTRAVENOUS | Status: AC
  Administered 2023-05-26: 2 g via INTRAVENOUS
  Filled 2023-05-26: qty 100

## 2023-05-26 MED ORDER — HYDROMORPHONE HCL 1 MG/ML IJ SOLN: 0.2500 mg | INTRAMUSCULAR | Status: DC | PRN

## 2023-05-26 MED ORDER — FENTANYL CITRATE (PF) 250 MCG/5ML IJ SOLN
INTRAMUSCULAR | Status: AC
  Filled 2023-05-26: qty 5

## 2023-05-26 MED ORDER — CHLORHEXIDINE GLUCONATE 4 % EX SOLN: 60.0000 mL | Freq: Once | CUTANEOUS | Status: DC

## 2023-05-26 MED ORDER — DEXMEDETOMIDINE HCL IN NACL 80 MCG/20ML IV SOLN
INTRAVENOUS | Status: DC | PRN
  Administered 2023-05-26: 8 ug via INTRAVENOUS

## 2023-05-26 MED ORDER — MIDAZOLAM HCL 2 MG/2ML IJ SOLN
INTRAMUSCULAR | Status: DC | PRN
  Administered 2023-05-26: 1 mg via INTRAVENOUS

## 2023-05-26 MED ORDER — EPHEDRINE SULFATE-NACL 50-0.9 MG/10ML-% IV SOSY
PREFILLED_SYRINGE | INTRAVENOUS | Status: DC | PRN
  Administered 2023-05-26: 15 mg via INTRAVENOUS
  Administered 2023-05-26: 50 mg via INTRAVENOUS
  Administered 2023-05-26: 10 mg via INTRAVENOUS
  Administered 2023-05-26: 15 mg via INTRAVENOUS

## 2023-05-26 MED ORDER — ROCURONIUM BROMIDE 10 MG/ML (PF) SYRINGE
PREFILLED_SYRINGE | INTRAVENOUS | Status: AC
  Filled 2023-05-26: qty 10

## 2023-05-26 MED ORDER — SODIUM CHLORIDE 0.9 % IR SOLN
Status: DC | PRN
  Administered 2023-05-26: 3000 mL

## 2023-05-26 MED ORDER — PHENYLEPHRINE HCL-NACL 20-0.9 MG/250ML-% IV SOLN
INTRAVENOUS | Status: DC | PRN
  Administered 2023-05-26: 30 ug/min via INTRAVENOUS

## 2023-05-26 MED ORDER — AMISULPRIDE (ANTIEMETIC) 5 MG/2ML IV SOLN: 10.0000 mg | Freq: Once | INTRAVENOUS | Status: DC | PRN

## 2023-05-26 MED ORDER — OXYCODONE HCL 5 MG PO TABS: 5.0000 mg | ORAL_TABLET | Freq: Once | ORAL | Status: DC | PRN

## 2023-05-26 MED ORDER — ROCURONIUM BROMIDE 10 MG/ML (PF) SYRINGE
PREFILLED_SYRINGE | INTRAVENOUS | Status: DC | PRN
  Administered 2023-05-26: 40 mg via INTRAVENOUS

## 2023-05-26 MED ORDER — POTASSIUM CHLORIDE CRYS ER 20 MEQ PO TBCR
40.0000 meq | EXTENDED_RELEASE_TABLET | Freq: Once | ORAL | Status: AC
  Administered 2023-05-26: 40 meq via ORAL
  Filled 2023-05-26: qty 2

## 2023-05-26 MED ORDER — 0.9 % SODIUM CHLORIDE (POUR BTL) OPTIME
TOPICAL | Status: DC | PRN
  Administered 2023-05-26: 1000 mL

## 2023-05-26 MED ORDER — MIDAZOLAM HCL 2 MG/2ML IJ SOLN
INTRAMUSCULAR | Status: AC
  Filled 2023-05-26: qty 2

## 2023-05-26 MED ORDER — FENTANYL CITRATE (PF) 250 MCG/5ML IJ SOLN
INTRAMUSCULAR | Status: DC | PRN
  Administered 2023-05-26: 25 ug via INTRAVENOUS
  Administered 2023-05-26: 100 ug via INTRAVENOUS
  Administered 2023-05-26 (×3): 25 ug via INTRAVENOUS
  Administered 2023-05-26: 50 ug via INTRAVENOUS

## 2023-05-26 MED ORDER — LACTATED RINGERS IV SOLN: INTRAVENOUS | Status: DC

## 2023-05-26 MED ORDER — INSULIN ASPART 100 UNIT/ML IJ SOLN: 0.0000 [IU] | INTRAMUSCULAR | Status: DC | PRN

## 2023-05-26 MED ORDER — EPHEDRINE 5 MG/ML INJ
INTRAVENOUS | Status: AC
  Filled 2023-05-26: qty 10

## 2023-05-26 MED ORDER — SUCCINYLCHOLINE CHLORIDE 200 MG/10ML IV SOSY
PREFILLED_SYRINGE | INTRAVENOUS | Status: DC | PRN
  Administered 2023-05-26: 100 mg via INTRAVENOUS

## 2023-05-26 MED ORDER — PROPOFOL 10 MG/ML IV BOLUS
INTRAVENOUS | Status: DC | PRN
  Administered 2023-05-26: 150 mg via INTRAVENOUS

## 2023-05-26 MED ORDER — SUGAMMADEX SODIUM 200 MG/2ML IV SOLN
INTRAVENOUS | Status: DC | PRN
  Administered 2023-05-26: 200 mg via INTRAVENOUS

## 2023-05-26 MED ORDER — ORAL CARE MOUTH RINSE: 15.0000 mL | Freq: Once | OROMUCOSAL | Status: AC

## 2023-05-26 MED ORDER — GLYCOPYRROLATE PF 0.2 MG/ML IJ SOSY
PREFILLED_SYRINGE | INTRAMUSCULAR | Status: DC | PRN
  Administered 2023-05-26: .2 mg via INTRAVENOUS

## 2023-05-26 SURGICAL SUPPLY — 56 items
BAG COUNTER SPONGE SURGICOUNT (BAG) IMPLANT
BAG SPNG CNTER NS LX DISP (BAG)
BNDG CMPR 5X4 KNIT ELC UNQ LF (GAUZE/BANDAGES/DRESSINGS) ×1
BNDG CMPR 6 X 5 YARDS HK CLSR (GAUZE/BANDAGES/DRESSINGS) ×1
BNDG COHESIVE 4X5 TAN STRL (GAUZE/BANDAGES/DRESSINGS) ×1 IMPLANT
BNDG ELASTIC 4INX 5YD STR LF (GAUZE/BANDAGES/DRESSINGS) IMPLANT
BNDG ELASTIC 6INX 5YD STR LF (GAUZE/BANDAGES/DRESSINGS) IMPLANT
BNDG GAUZE DERMACEA FLUFF 4 (GAUZE/BANDAGES/DRESSINGS) ×2 IMPLANT
BNDG GZE DERMACEA 4 6PLY (GAUZE/BANDAGES/DRESSINGS)
BRUSH SCRUB EZ PLAIN DRY (MISCELLANEOUS) ×2 IMPLANT
CANISTER WOUND CARE 500ML ATS (WOUND CARE) IMPLANT
CASSETTE VERAFLO VERALINK (MISCELLANEOUS) IMPLANT
COVER SURGICAL LIGHT HANDLE (MISCELLANEOUS) ×2 IMPLANT
DRAPE U-SHAPE 47X51 STRL (DRAPES) ×1 IMPLANT
DRESSING VERAFLO CLEANS CC MED (GAUZE/BANDAGES/DRESSINGS) IMPLANT
DRSG ADAPTIC 3X8 NADH LF (GAUZE/BANDAGES/DRESSINGS) ×1 IMPLANT
DRSG VERAFLO CLEANSE CC MED (GAUZE/BANDAGES/DRESSINGS) ×2
ELECT REM PT RETURN 9FT ADLT (ELECTROSURGICAL) ×1
ELECTRODE REM PT RTRN 9FT ADLT (ELECTROSURGICAL) IMPLANT
GAUZE SPONGE 4X4 12PLY STRL (GAUZE/BANDAGES/DRESSINGS) ×1 IMPLANT
GLOVE BIO SURGEON STRL SZ7.5 (GLOVE) ×1 IMPLANT
GLOVE BIO SURGEON STRL SZ8 (GLOVE) ×1 IMPLANT
GLOVE BIOGEL PI IND STRL 7.5 (GLOVE) ×1 IMPLANT
GLOVE BIOGEL PI IND STRL 8 (GLOVE) ×1 IMPLANT
GLOVE BIOGEL PI IND STRL 9 (GLOVE) ×1 IMPLANT
GLOVE SURG ORTHO LTX SZ7.5 (GLOVE) ×2 IMPLANT
GOWN STRL REUS W/ TWL LRG LVL3 (GOWN DISPOSABLE) ×2 IMPLANT
GOWN STRL REUS W/ TWL XL LVL3 (GOWN DISPOSABLE) ×1 IMPLANT
GOWN STRL REUS W/TWL LRG LVL3 (GOWN DISPOSABLE) ×2
GOWN STRL REUS W/TWL XL LVL3 (GOWN DISPOSABLE) ×1
HANDPIECE INTERPULSE COAX TIP (DISPOSABLE)
KIT BASIN OR (CUSTOM PROCEDURE TRAY) ×1 IMPLANT
KIT TURNOVER KIT B (KITS) ×1 IMPLANT
MANIFOLD NEPTUNE II (INSTRUMENTS) ×1 IMPLANT
NS IRRIG 1000ML POUR BTL (IV SOLUTION) ×1 IMPLANT
PACK ORTHO EXTREMITY (CUSTOM PROCEDURE TRAY) ×1 IMPLANT
PAD ABD 8X10 STRL (GAUZE/BANDAGES/DRESSINGS) IMPLANT
PAD ARMBOARD 7.5X6 YLW CONV (MISCELLANEOUS) ×2 IMPLANT
PAD CAST 4YDX4 CTTN HI CHSV (CAST SUPPLIES) IMPLANT
PADDING CAST COTTON 4X4 STRL (CAST SUPPLIES) ×1
PADDING CAST COTTON 6X4 STRL (CAST SUPPLIES) ×1 IMPLANT
SET HNDPC FAN SPRY TIP SCT (DISPOSABLE) IMPLANT
SLING ARM IMMOBILIZER XL (CAST SUPPLIES) IMPLANT
SOL PREP POV-IOD 4OZ 10% (MISCELLANEOUS) ×1 IMPLANT
SOL SCRUB PVP POV-IOD 4OZ 7.5% (MISCELLANEOUS) ×1
SOLUTION SCRB POV-IOD 4OZ 7.5% (MISCELLANEOUS) ×1 IMPLANT
SPLINT PLASTER CAST FAST 5X30 (CAST SUPPLIES) IMPLANT
SPONGE T-LAP 18X18 ~~LOC~~+RFID (SPONGE) ×1 IMPLANT
STOCKINETTE IMPERVIOUS 9X36 MD (GAUZE/BANDAGES/DRESSINGS) IMPLANT
SUT PDS AB 2-0 CT1 27 (SUTURE) IMPLANT
TOWEL GREEN STERILE (TOWEL DISPOSABLE) ×2 IMPLANT
TOWEL GREEN STERILE FF (TOWEL DISPOSABLE) ×1 IMPLANT
TUBE CONNECTING 12X1/4 (SUCTIONS) ×1 IMPLANT
UNDERPAD 30X36 HEAVY ABSORB (UNDERPADS AND DIAPERS) ×1 IMPLANT
WATER STERILE IRR 1000ML POUR (IV SOLUTION) ×1 IMPLANT
YANKAUER SUCT BULB TIP NO VENT (SUCTIONS) ×1 IMPLANT

## 2023-05-26 NOTE — Plan of Care (Signed)
  Problem: Nutrition: Goal: Adequate nutrition will be maintained Outcome: Progressing   Problem: Clinical Measurements: Goal: Will remain free from infection Outcome: Not Progressing

## 2023-05-26 NOTE — Hospital Course (Addendum)
  Brief Narrative:  67 year old incarcerated male with history of asthma, DM 2, HTN, CKD stage III A, CVA presents to the ED with complaints of arm swelling.  Patient was [facility] jail on August [facility] after being treated for cellulitis of the left forearm.  At that time patient completed course of vancomycin and Unasyn and eventually transition to Augmentin and doxycycline.  Returns back to the hospital due to worsening pain and swelling.  X-ray of the elbow shows diffuse edema.  CT performed eventually showed worsening cellulitis with larger abscess therefore orthopedic consulted who plans on the I&D. Underwent extensive I&D by orthopedic on 8/29 with application of wound VAC.  ID Following.  Status post repeat I&D by orthopedic on 9/3.  Recommending nonweightbearing of the left upper extremity, splint, okay to move fingers and dressing to remain in place until 3-week follow-up.  No further surgical intervention planned.     Assessment & Plan:  Principal Problem:   Left arm cellulitis   Left upper extremity cellulitis with progressing abscess - Failed outpatient antibiotic, CT upper extremity showed worsening of infection.  Underwent extensive I&D by orthopedic on 8/29 with application of wound VAC.  Status post repeat I&D on 9/3.  Ortho recommending nonweightbearing of left upper extremity, splint, okay to move fingers and dressing to remain in place for 3 weeks. - Will discuss with ID antibiotic plan and make discharge disposition plans  Again today discussed case with Dr Tasia Catchings, from [facility] [place], on [place]. They will accept the patient once cleared by ID pending culture sensitivity/susceptibility at this time.    History of asthma - Bronchodilators as needed   Diabetes mellitus type 2 - Diabetic diet.  Sliding scale and Accu-Cheks. - Adjust insulin as necessary.   CKD stage IIIa -Creatinine around baseline of 1.5   Essential  hypertension -Norvasc, Coreg, lisinopril.  IV as needed   History of CVA -MRI was positive for CVA in July 2024.  On statin   Depression -Cymbalta   DVT prophylaxis: enoxaparin (LOVENOX) injection 40 mg Start: 05/25/23 1000 SCDs Start: 05/24/23 2247 Code Status: Full code Family Communication:   DC plan once cleraed by ID

## 2023-05-26 NOTE — Op Note (Signed)
NAMEJAXZEN, ENTIN MEDICAL RECORD NO: [id number] ACCOUNT NO: [id number] DATE OF BIRTH: May 16, 1956 FACILITY: [facility] LOCATION: [facility] PHYSICIAN: Doralee Albino. Carola Frost, MD  Operative Report   DATE OF PROCEDURE: 05/26/2023  PREOPERATIVE DIAGNOSES:  Extensive abscess, left arm, elbow, and forearm.  POSTOPERATIVE DIAGNOSES:  Extensive abscess, left arm, elbow, and forearm.  PROCEDURE: 1.  Incision and drainage of abscess, left arm. 2.  Incision and drainage of abscess, left forearm. 3.  Bursectomy (excision of bursa), left elbow. 4.  Application of large wound VAC, left upper extremity.  SURGEON:  Myrene Galas, MD.  ASSISTANT:  Montez Morita, PA-C.  ANESTHESIA:  General.  COMPLICATIONS:  None.  SPECIMENS:  Multiple including swabs for anaerobic, aerobic, bacteria sent to micro.  Also, a specimen of caseous material sent to micro as well for culture as well as fungal and AFB cultures.  ESTIMATED BLOOD LOSS:  200 mL.  DISPOSITION:  To PACU.  CONDITION:  Stable.  BRIEF SUMMARY OF INDICATIONS FOR PROCEDURE:  The patient is a 66 year old prisoner who has previously presented with left elbow pain.  He has been treated with empiric antibiotics to which his infection failed to resolve.  Over the course of the day, he  was noted to have a decreased arousal and increasing disorientation. Given that this was consistent with sepsis, we recommended emergent incision and drainage of his complex abscess.  CT scan obtained earlier today showed involvement extending well up  the arm toward the shoulder, acute on chronic changes at the elbow and also abscess extending down to the wrist.  I did attempt to explain to the patient the need for surgical intervention.  The patient nodded but again was not mentating with sufficient  clarity to provide meaningful consent and in consultation with Dr. Anitra Lauth of the anesthesia team, we were in agreement that proceeding emergently was in the best interest of  the patient and proceeded to do so.  BRIEF SUMMARY OF PROCEDURE:  The patient was taken to the operating room where general anesthesia was induced.  He remains on cefepime and vancomycin.  He did receive Ancef preoperatively as well.  The left upper extremity was prepped and draped in the  usual sterile fashion.  No tourniquet was used during the procedure.  After a chlorhexidine wash, Betadine scrub and paint and draping, timeout was held.  I began with a posterior incision centered about the elbow.  A deep incision to the area above the  fascia resulted in a copious elution of purulent material.  I extended the incision 8 cm proximally into the arm and found the abscess in the arm to track another 20 cm.  I evacuated this with a suction.  There was a caseous semi-organized material  around the elbow and bursa.  Next, I extended the incision distally into the forearm and encountered an abscess that ran for a 16 cm after the incision, which itself was again another 8 cm from the elbow distally into the forearm.  After evacuating this  material, I then turned my attention to the olecranon bursa itself, which was again of caseous texture and appeared to have been a chronic abscess.  I excised the bursa in its entirety.  I used a large curette to scrape the cavity.  Then, a chlorhexidine  soaked sponge to help augment the debridement with subsequent irrigation.  I used 3000 mL of saline.  I did use a rongeur to remove additional subcutaneous nodules and necrotic debris in the arm as  well as in the forearm areas of abscess.  Following  this, a large wound VAC was placed that covered a longitudinal area in excess of 40 cm and was anywhere from a 9-4 cm in breath and was 3 cm deep.  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  Montez Morita, PA-C was  present, assisted me throughout.  PROGNOSIS:  The patient will need to return to the OR in the next 3 days or so for repeat debridement and VAC change.  We were hopeful to do installation VAC treatment with 15-25 mL of saline depending upon how well this seal can be maintained.  He  remains at high risk for persistent infection and soft tissue management will be challenging.  Tight glucose control will be very important.  We will contact infectious disease service given the acute on chronic nature of this infection.   NIK D: 05/26/2023 3:57:50 pm T: 05/26/2023 7:37:00 pm  JOB: [id number]/ [id number]

## 2023-05-26 NOTE — Progress Notes (Signed)
PROGRESS NOTE    Justin Snow  WUJ:[id number] DOB: [birth year] DOA: 05/24/2023 PCP: Pcp, No     Brief Narrative:  67 year old incarcerated male with history of asthma, DM 2, HTN, CKD stage III A, CVA presents to the ED with complaints of arm swelling.  Patient was [facility] the [facility] to jail on August 28 after being treated for cellulitis of the left forearm.  At that time patient completed course of vancomycin and Unasyn and eventually transition to Augmentin and doxycycline.  Returns back to the hospital due to worsening pain and swelling.  X-ray of the elbow shows diffuse edema.     Assessment & Plan:  Principal Problem:   Left arm cellulitis   Left upper extremity cellulitis with progressing abscess - Currently on IV fluids, vancomycin and cefepime.  Follow culture data.Failed outpatient Abx, still quite severe therefore CT obtained of the left upper extremity which shows progressive/large abscess therefore orthopedic consulted who plans on surgery later today.  Asked case management request, I discussed case with patient's Prison system provier Dr Camelia Phenes on [phone]. He advised me to complete his surgery here in the hospital and once patient is medically stable he can be transferred back to prison system on IV or p.o. antibiotic which ever is necessary   History of asthma - Bronchodilators as needed   Diabetes mellitus type 2 - Diabetic diet.  Sliding scale and Accu-Cheks. -Semglee, sliding scale.  Adjust as necessary   CKD stage IIIa -Creatinine around baseline of 1.5   Essential hypertension -Norvasc, Coreg, HCTZ, lisinopril.  IV as needed   History of CVA -MRI was positive for CVA in July 2024.  On statin   Depression -Cymbalta   DVT prophylaxis: enoxaparin (LOVENOX) injection 40 mg Start: 05/25/23 1000 SCDs Start: 05/24/23 2247 Code Status: Full code Family Communication:   Cont Hosp for IV antibiotics and further evaluation of left lower  extremity infection                   Diet Orders (From admission, onward)     Start     Ordered   05/26/23 1135  Diet NPO time specified Except for: Sips with Meds  Diet effective now       Question:  Except for  Answer:  Clearance Coots with Meds   05/26/23 1135            Subjective: Seen at bedside, left upper extremity swelling is improved but still having pain and discomfort   Examination:  General exam: Appears calm and comfortable  Respiratory system: Clear to auscultation. Respiratory effort normal. Cardiovascular system: S1 & S2 heard, RRR. No JVD, murmurs, rubs, gallops or clicks. No pedal edema. Gastrointestinal system: Abdomen is nondistended, soft and nontender. No organomegaly or masses felt. Normal bowel sounds heard. Central nervous system: Alert and oriented. No focal neurological deficits. Extremities: Symmetric 5 x 5 power. Skin: Left upper extremity swelling and erythema.  Pain with palpation Psychiatry: Judgement and insight appear normal. Mood & affect appropriate.  Objective: Vitals:   05/25/23 1936 05/25/23 2026 05/26/23 0523 05/26/23 0823  BP: (!) 128/48  (!) 159/81 125/83  Pulse: (!) 55  66 72  Resp: 17  18 18   Temp: 98.6 F (37 C)  97.6 F (36.4 C) 98.3 F (36.8 C)  TempSrc:      SpO2: 100% 95% 100% 100%  Weight:      Height:        Intake/Output Summary (Last 24  hours) at 05/26/2023 1246 Last data filed at 05/26/2023 1100 Gross per 24 hour  Intake 480 ml  Output 3175 ml  Net -2695 ml   Filed Weights   05/24/23 1928  Weight: 99.8 kg    Scheduled Meds:  acetaminophen  1,000 mg Oral Once   amLODipine  10 mg Oral Daily   atorvastatin  10 mg Oral QHS   carvedilol  6.25 mg Oral BID WC   chlorhexidine  60 mL Topical Once   DULoxetine  30 mg Oral BID   enoxaparin (LOVENOX) injection  40 mg Subcutaneous Daily   hydrochlorothiazide  12.5 mg Oral Daily   insulin aspart  0-15 Units Subcutaneous TID WC   insulin aspart  0-5 Units  Subcutaneous QHS   insulin glargine-yfgn  15 Units Subcutaneous Daily   lisinopril  40 mg Oral Daily   mometasone-formoterol  2 puff Inhalation BID   pantoprazole  40 mg Oral Daily   povidone-iodine  2 Application Topical Once   sodium chloride flush  3 mL Intravenous Q12H   Continuous Infusions:  sodium chloride 75 mL/hr at 05/25/23 0546   [START ON 05/27/2023]  ceFAZolin (ANCEF) IV     ceFEPime (MAXIPIME) IV 2 g (05/26/23 0810)   vancomycin 1,000 mg (05/25/23 2103)    Nutritional status     Body mass index is 34.46 kg/m.  Data Reviewed:   CBC: Recent Labs  Lab 05/24/23 1705 05/24/23 2349 05/25/23 1402 05/26/23 0403  WBC 20.5* 19.7* 14.6* 15.4*  NEUTROABS 16.9*  --   --   --   HGB 11.8* 11.2* 12.5* 11.0*  HCT 35.5* 33.1* 37.1* 33.1*  MCV 85.5 84.9 85.5 85.3  PLT 388 354 321 335   Basic Metabolic Panel: Recent Labs  Lab 05/24/23 1705 05/24/23 2349 05/25/23 1402 05/26/23 0403  NA 134*  --  136 133*  K 3.9  --  3.6 3.4*  CL 101  --  101 104  CO2 23  --  22 21*  GLUCOSE 155*  --  154* 140*  BUN 26*  --  27* 25*  CREATININE 1.51* 1.52* 1.20 1.45*  CALCIUM 8.2*  --  8.3* 8.0*  MG  --   --   --  1.6*  PHOS  --   --   --  3.8   GFR: Estimated Creatinine Clearance: 54.1 mL/min (A) (by C-G formula based on SCr of 1.45 mg/dL (H)). Liver Function Tests: Recent Labs  Lab 05/24/23 1705  AST 17  ALT 14  ALKPHOS 114  BILITOT 0.8  PROT 5.9*  ALBUMIN 2.1*   No results for input(s): "LIPASE", "AMYLASE" in the last 168 hours. No results for input(s): "AMMONIA" in the last 168 hours. Coagulation Profile: No results for input(s): "INR", "PROTIME" in the last 168 hours. Cardiac Enzymes: No results for input(s): "CKTOTAL", "CKMB", "CKMBINDEX", "TROPONINI" in the last 168 hours. BNP (last 3 results) No results for input(s): "PROBNP" in the last 8760 hours. HbA1C: No results for input(s): "HGBA1C" in the last 72 hours. CBG: Recent Labs  Lab 05/25/23 1135  05/25/23 1556 05/25/23 1937 05/26/23 0810 05/26/23 1144  GLUCAP 150* 171* 111* 187* 167*   Lipid Profile: No results for input(s): "CHOL", "HDL", "LDLCALC", "TRIG", "CHOLHDL", "LDLDIRECT" in the last 72 hours. Thyroid Function Tests: No results for input(s): "TSH", "T4TOTAL", "FREET4", "T3FREE", "THYROIDAB" in the last 72 hours. Anemia Panel: No results for input(s): "VITAMINB12", "FOLATE", "FERRITIN", "TIBC", "IRON", "RETICCTPCT" in the last 72 hours. Sepsis Labs: Recent Labs  Lab 05/24/23 1712 05/24/23 1910  LATICACIDVEN 1.0 1.7    Recent Results (from the past 240 hour(s))  Blood culture (routine x 2)     Status: None (Preliminary result)   Collection Time: 05/24/23  5:15 PM   Specimen: BLOOD  Result Value Ref Range Status   Specimen Description BLOOD LEFT ANTECUBITAL  Final   Special Requests   Final    BOTTLES DRAWN AEROBIC ONLY Blood Culture adequate volume   Culture   Final    NO GROWTH 2 DAYS Performed at [facility], [address] [place], [place] [address]    Report Status PENDING  Incomplete  Blood culture (routine x 2)     Status: None (Preliminary result)   Collection Time: 05/24/23  6:57 PM   Specimen: BLOOD RIGHT HAND  Result Value Ref Range Status   Specimen Description BLOOD RIGHT HAND  Final   Special Requests   Final    BOTTLES DRAWN AEROBIC ONLY Blood Culture results may not be optimal due to an inadequate volume of blood received in culture bottles   Culture   Final    NO GROWTH 2 DAYS Performed at [facility], [address] [place], [place] [address]    Report Status PENDING  Incomplete         Radiology Studies: CT HUMERUS LEFT W CONTRAST  Result Date: 05/26/2023 CLINICAL DATA:  Left arm cellulitis follow-up. EXAM: CT OF THE UPPER LEFT EXTREMITY WITH CONTRAST TECHNIQUE: Multidetector CT imaging of the left upper arm, left forearm, and left hand was performed according to the standard protocol following intravenous  contrast administration. RADIATION DOSE REDUCTION: This exam was performed according to the departmental dose-optimization program which includes automated exposure control, adjustment of the mA and/or kV according to patient size and/or use of iterative reconstruction technique. CONTRAST:  75mL OMNIPAQUE IOHEXOL 350 MG/ML SOLN COMPARISON:  CT left upper arm and forearm dated May 16, 2023. FINDINGS: Bones/Joint/Cartilage No bony destruction or periosteal reaction. No acute fracture or dislocation. Sequelae of prior remote left acromioclavicular joint injury again noted. 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 Unchanged small elbow joint effusion. Ligaments Ligaments are suboptimally evaluated by CT. Muscles and Tendons Grossly intact. Worsening hypodensity and enlargement of the left triceps muscle with new fluid collection described below. Remaining muscles appear normal. Small lipomas within the left triceps and brachialis muscles again noted. Soft tissue Progressive severe circumferential skin thickening and soft tissue swelling the left arm and hand. New large irregular rim enhancing subcutaneous and intramuscular fluid collection involving the posterior arm and triceps muscle, extending from the mid upper arm to the mid forearm. This measures up to 3.3 x 6.7 x 29.8 cm (AP by transverse by CC) (series 9, image 63). Additional 1.2 x 1.2 x 5.0 cm rim enhancing fluid collection in the central triceps muscle (series 4, image 291; series 9, image 63). Smaller 4.4 x 1.1 x 5.2 cm rim enhancing fluid collection along the medial aspect of the proximal forearm (series 4, image 215). No subcutaneous emphysema. Possible new skin ulceration of the antecubital fossa (series 4, image 231). Multiple enlarged reactive left axillary lymph nodes again noted. Mild centrilobular emphysema in the visualized lungs. IMPRESSION: 1. Progressive severe cellulitis of the entire  left arm and hand with new large subcutaneous and intramuscular abscess involving the posterior arm and triceps muscle, extending from the mid upper arm to the mid forearm. Additional smaller abscesses in the central triceps  muscle and medial aspect of the proximal forearm. 2. Increased hypodensity and enlargement of the left triceps muscle, concerning for worsening infectious myositis. 3. Possible new skin ulceration of the antecubital fossa. Correlate with physical exam. 4. No evidence of osteomyelitis. 5.  Emphysema (ICD10-J43.9). Electronically Signed   By: Obie Dredge M.D.   On: 05/26/2023 09:51   CT FOREARM LEFT W CONTRAST  Result Date: 05/26/2023 CLINICAL DATA:  Left arm cellulitis follow-up. EXAM: CT OF THE UPPER LEFT EXTREMITY WITH CONTRAST TECHNIQUE: Multidetector CT imaging of the left upper arm, left forearm, and left hand was performed according to the standard protocol following intravenous contrast administration. RADIATION DOSE REDUCTION: This exam was performed according to the departmental dose-optimization program which includes automated exposure control, adjustment of the mA and/or kV according to patient size and/or use of iterative reconstruction technique. CONTRAST:  75mL OMNIPAQUE IOHEXOL 350 MG/ML SOLN COMPARISON:  CT left upper arm and forearm dated May 16, 2023. FINDINGS: Bones/Joint/Cartilage No bony destruction or periosteal reaction. No acute fracture or dislocation. Sequelae of prior remote left acromioclavicular joint injury again noted. 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 Unchanged small elbow joint effusion. Ligaments Ligaments are suboptimally evaluated by CT. Muscles and Tendons Grossly intact. Worsening hypodensity and enlargement of the left triceps muscle with new fluid collection described below. Remaining muscles appear normal. Small lipomas within the left triceps and brachialis muscles again noted.  Soft tissue Progressive severe circumferential skin thickening and soft tissue swelling the left arm and hand. New large irregular rim enhancing subcutaneous and intramuscular fluid collection involving the posterior arm and triceps muscle, extending from the mid upper arm to the mid forearm. This measures up to 3.3 x 6.7 x 29.8 cm (AP by transverse by CC) (series 9, image 63). Additional 1.2 x 1.2 x 5.0 cm rim enhancing fluid collection in the central triceps muscle (series 4, image 291; series 9, image 63). Smaller 4.4 x 1.1 x 5.2 cm rim enhancing fluid collection along the medial aspect of the proximal forearm (series 4, image 215). No subcutaneous emphysema. Possible new skin ulceration of the antecubital fossa (series 4, image 231). Multiple enlarged reactive left axillary lymph nodes again noted. Mild centrilobular emphysema in the visualized lungs. IMPRESSION: 1. Progressive severe cellulitis of the entire left arm and hand with new large subcutaneous and intramuscular abscess involving the posterior arm and triceps muscle, extending from the mid upper arm to the mid forearm. Additional smaller abscesses in the central triceps muscle and medial aspect of the proximal forearm. 2. Increased hypodensity and enlargement of the left triceps muscle, concerning for worsening infectious myositis. 3. Possible new skin ulceration of the antecubital fossa. Correlate with physical exam. 4. No evidence of osteomyelitis. 5.  Emphysema (ICD10-J43.9). Electronically Signed   By: Obie Dredge M.D.   On: 05/26/2023 09:51   CT HAND LEFT W CONTRAST  Result Date: 05/26/2023 CLINICAL DATA:  Left arm cellulitis follow-up. EXAM: CT OF THE UPPER LEFT EXTREMITY WITH CONTRAST TECHNIQUE: Multidetector CT imaging of the left upper arm, left forearm, and left hand was performed according to the standard protocol following intravenous contrast administration. RADIATION DOSE REDUCTION: This exam was performed according to the  departmental dose-optimization program which includes automated exposure control, adjustment of the mA and/or kV according to patient size and/or use of iterative reconstruction technique. CONTRAST:  75mL OMNIPAQUE IOHEXOL 350 MG/ML SOLN COMPARISON:  CT left upper arm and forearm dated May 16, 2023. FINDINGS: Bones/Joint/Cartilage No bony destruction  or periosteal reaction. No acute fracture or dislocation. Sequelae of prior remote left acromioclavicular joint injury again noted. 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 Unchanged small elbow joint effusion. Ligaments Ligaments are suboptimally evaluated by CT. Muscles and Tendons Grossly intact. Worsening hypodensity and enlargement of the left triceps muscle with new fluid collection described below. Remaining muscles appear normal. Small lipomas within the left triceps and brachialis muscles again noted. Soft tissue Progressive severe circumferential skin thickening and soft tissue swelling the left arm and hand. New large irregular rim enhancing subcutaneous and intramuscular fluid collection involving the posterior arm and triceps muscle, extending from the mid upper arm to the mid forearm. This measures up to 3.3 x 6.7 x 29.8 cm (AP by transverse by CC) (series 9, image 63). Additional 1.2 x 1.2 x 5.0 cm rim enhancing fluid collection in the central triceps muscle (series 4, image 291; series 9, image 63). Smaller 4.4 x 1.1 x 5.2 cm rim enhancing fluid collection along the medial aspect of the proximal forearm (series 4, image 215). No subcutaneous emphysema. Possible new skin ulceration of the antecubital fossa (series 4, image 231). Multiple enlarged reactive left axillary lymph nodes again noted. Mild centrilobular emphysema in the visualized lungs. IMPRESSION: 1. Progressive severe cellulitis of the entire left arm and hand with new large subcutaneous and intramuscular abscess involving the  posterior arm and triceps muscle, extending from the mid upper arm to the mid forearm. Additional smaller abscesses in the central triceps muscle and medial aspect of the proximal forearm. 2. Increased hypodensity and enlargement of the left triceps muscle, concerning for worsening infectious myositis. 3. Possible new skin ulceration of the antecubital fossa. Correlate with physical exam. 4. No evidence of osteomyelitis. 5.  Emphysema (ICD10-J43.9). Electronically Signed   By: Obie Dredge M.D.   On: 05/26/2023 09:51   VAS [place] UPPER EXTREMITY VENOUS DUPLEX  Result Date: 05/25/2023 UPPER VENOUS STUDY  Patient Name:  Justin Snow  Date of Exam:   05/25/2023 Medical Rec #: [id number]           Accession #:    [id number] Date of Birth: [birth year]           Patient Gender: M Patient Age:   42 years Exam Location:  [facility] Procedure:      VAS [place] UPPER EXTREMITY VENOUS DUPLEX Referring Phys: ALEXIS HUGELMEYER --------------------------------------------------------------------------------  Indications: Swelling, and Erythema Other Indications: LUE cellulitis. Limitations: Body habitus and poor ultrasound/tissue interface. Comparison Study: Previous exam on 05/16/2023 was negative for DVT Performing Technologist: Ernestene Mention RVT, RDMS  Examination Guidelines: A complete evaluation includes B-mode imaging, spectral Doppler, color Doppler, and power Doppler as needed of all accessible portions of each vessel. Bilateral testing is considered an integral part of a complete examination. Limited examinations for reoccurring indications may be performed as noted.  Right Findings: +----------+------------+---------+-----------+----------+-------+ RIGHT     CompressiblePhasicitySpontaneousPropertiesSummary +----------+------------+---------+-----------+----------+-------+ Subclavian    Full       Yes       Yes                      +----------+------------+---------+-----------+----------+-------+   Left Findings: +----------+------------+---------+-----------+----------+--------------------+ LEFT      CompressiblePhasicitySpontaneousProperties      Summary        +----------+------------+---------+-----------+----------+--------------------+ IJV           Full       Yes       Yes                                   +----------+------------+---------+-----------+----------+--------------------+  Subclavian    Full       Yes       Yes                                   +----------+------------+---------+-----------+----------+--------------------+ Axillary      Full       Yes       Yes                                   +----------+------------+---------+-----------+----------+--------------------+ Brachial      Full       Yes       Yes                                   +----------+------------+---------+-----------+----------+--------------------+ Radial                                              not well visualized  +----------+------------+---------+-----------+----------+--------------------+ Ulnar                    Yes       Yes                   patent by                                                              color/doppler     +----------+------------+---------+-----------+----------+--------------------+ Cephalic      Full                                                       +----------+------------+---------+-----------+----------+--------------------+ Basilic       Full       Yes       Yes                                   +----------+------------+---------+-----------+----------+--------------------+  Summary:  Right: No evidence of thrombosis in the subclavian. Subcutaneous edema throughout LUE.  Left: No evidence of deep vein thrombosis in the upper extremity. No evidence of superficial vein thrombosis in the upper extremity.  *See table(s) above for measurements and observations.  Diagnosing physician: Sherald Hess MD  Electronically signed by Sherald Hess MD on 05/25/2023 at 1:14:08 PM.    Final    DG Elbow Complete Left  Result Date: 05/24/2023 CLINICAL DATA:  Arm swelling, infection/cellulitis, pain EXAM: LEFT ELBOW - COMPLETE 3+ VIEW COMPARISON:  05/15/2023 FINDINGS: Diffuse extremity soft tissue swelling/edema. Limited exam because of positioning. No acute osseous finding, fracture or definite large effusion. No acute joint abnormality. IMPRESSION: Diffuse soft tissue swelling/edema. No acute osseous finding by plain radiography. Electronically Signed   By: Judie Petit.  Shick M.D.   On: 05/24/2023 17:46  LOS: 1 day   Time spent= 35 mins    Miguel Rota, MD [facility]  If 7PM-7AM, please contact night-coverage  05/26/2023, 12:46 PM

## 2023-05-26 NOTE — Anesthesia Procedure Notes (Addendum)
Procedure Name: Intubation Date/Time: 05/26/2023 2:01 PM  Performed by: Ayesha Rumpf, CRNAPre-anesthesia Checklist: Patient identified, Emergency Drugs available, Suction available and Patient being monitored Patient Re-evaluated:Patient Re-evaluated prior to induction Oxygen Delivery Method: Circle System Utilized Preoxygenation: Pre-oxygenation with 100% oxygen Induction Type: IV induction Ventilation: Mask ventilation without difficulty Laryngoscope Size: Glidescope and 4 Grade View: Grade I Tube type: Oral Number of attempts: 1 Airway Equipment and Method: Stylet and Oral airway Placement Confirmation: ETT inserted through vocal cords under direct vision, positive ETCO2 and breath sounds checked- equal and bilateral Secured at: 23 cm Tube secured with: Tape Dental Injury: Teeth and Oropharynx as per pre-operative assessment

## 2023-05-26 NOTE — Consult Note (Signed)
Reason for Consult:LUE abscess Referring Physician: Stephania Fragmin Time called: 1108 Time at bedside: 1122   Justin Snow is an 67 y.o. male.  HPI: Justin Snow developed a fever and left arm cellulitis about 2 weeks ago. He was admitted 10d ago without surgical indication and treated medically. [facility]. His arm seemed to improve but then got worse again and he returned to the ED and was readmitted. Repeat CT scan now shows a large abscess and orthopedic surgery was reconsulted.  Past Medical History:  Diagnosis Date   Asthma    Diabetes mellitus without complication (HCC)    Hypertension     History reviewed. No pertinent surgical history.  Family History  Problem Relation Age of Onset   Heart disease Father     Social History:  reports that he has been smoking cigarettes. He has never used smokeless tobacco. He reports that he does not currently use alcohol. He reports that he does not currently use drugs.  Allergies: No Known Allergies  Medications: I have reviewed the patient's current medications.  Results for orders placed or performed during the hospital encounter of 05/24/23 (from the past 48 hour(s))  CBC with Differential     Status: Abnormal   Collection Time: 05/24/23  5:05 PM  Result Value Ref Range   WBC 20.5 (H) 4.0 - 10.5 K/uL   RBC 4.15 (L) 4.22 - 5.81 MIL/uL   Hemoglobin 11.8 (L) 13.0 - 17.0 g/dL   HCT 16.1 (L) 09.6 - 04.5 %   MCV 85.5 80.0 - 100.0 fL   MCH 28.4 26.0 - 34.0 pg   MCHC 33.2 30.0 - 36.0 g/dL   RDW 40.9 81.1 - 91.4 %   Platelets 388 150 - 400 K/uL   nRBC 0.0 0.0 - 0.2 %   Neutrophils Relative % 82 %   Neutro Abs 16.9 (H) 1.7 - 7.7 K/uL   Lymphocytes Relative 10 %   Lymphs Abs 2.0 0.7 - 4.0 K/uL   Monocytes Relative 6 %   Monocytes Absolute 1.1 (H) 0.1 - 1.0 K/uL   Eosinophils Relative 0 %   Eosinophils Absolute 0.1 0.0 - 0.5 K/uL   Basophils Relative 0 %   Basophils Absolute 0.1 0.0 - 0.1 K/uL   Immature  Granulocytes 2 %   Abs Immature Granulocytes 0.30 (H) 0.00 - 0.07 K/uL    Comment: Performed at [facility], [address], [place], [place] [address]  Comprehensive metabolic panel     Status: Abnormal   Collection Time: 05/24/23  5:05 PM  Result Value Ref Range   Sodium 134 (L) 135 - 145 mmol/L   Potassium 3.9 3.5 - 5.1 mmol/L   Chloride 101 98 - 111 mmol/L   CO2 23 22 - 32 mmol/L   Glucose, Bld 155 (H) 70 - 99 mg/dL    Comment: Glucose reference range applies only to samples taken after fasting for at least 8 hours.   BUN 26 (H) 8 - 23 mg/dL   Creatinine, Ser 6.21 (H) 0.61 - 1.24 mg/dL   Calcium 8.2 (L) 8.9 - 10.3 mg/dL   Total Protein 5.9 (L) 6.5 - 8.1 g/dL   Albumin 2.1 (L) 3.5 - 5.0 g/dL   AST 17 15 - 41 U/L   ALT 14 0 - 44 U/L   Alkaline Phosphatase 114 38 - 126 U/L   Total Bilirubin 0.8 0.3 - 1.2 mg/dL   GFR, Estimated 50 (L) >60 mL/min  Comment: (NOTE) Calculated using the CKD-EPI Creatinine Equation (2021)    Anion gap 10 5 - 15    Comment: Performed at [facility], [address] [place], [place] [address]  I-Stat CG4 Lactic Acid     Status: None   Collection Time: 05/24/23  5:12 PM  Result Value Ref Range   Lactic Acid, Venous 1.0 0.5 - 1.9 mmol/L  Blood culture (routine x 2)     Status: None (Preliminary result)   Collection Time: 05/24/23  5:15 PM   Specimen: BLOOD  Result Value Ref Range   Specimen Description BLOOD LEFT ANTECUBITAL    Special Requests      BOTTLES DRAWN AEROBIC ONLY Blood Culture adequate volume   Culture      NO GROWTH 2 DAYS Performed at [facility], [address] [place], [place] [address]    Report Status PENDING   Blood culture (routine x 2)     Status: None (Preliminary result)   Collection Time: 05/24/23  6:57 PM   Specimen: BLOOD RIGHT HAND  Result Value Ref Range   Specimen Description BLOOD RIGHT HAND    Special Requests      BOTTLES DRAWN AEROBIC ONLY Blood Culture results may not be optimal due to  an inadequate volume of blood received in culture bottles   Culture      NO GROWTH 2 DAYS Performed at [facility], [address] [place], [place] [address]    Report Status PENDING   I-Stat CG4 Lactic Acid     Status: None   Collection Time: 05/24/23  7:10 PM  Result Value Ref Range   Lactic Acid, Venous 1.7 0.5 - 1.9 mmol/L  Glucose, capillary     Status: Abnormal   Collection Time: 05/24/23 10:50 PM  Result Value Ref Range   Glucose-Capillary 138 (H) 70 - 99 mg/dL    Comment: Glucose reference range applies only to samples taken after fasting for at least 8 hours.  CBC     Status: Abnormal   Collection Time: 05/24/23 11:49 PM  Result Value Ref Range   WBC 19.7 (H) 4.0 - 10.5 K/uL   RBC 3.90 (L) 4.22 - 5.81 MIL/uL   Hemoglobin 11.2 (L) 13.0 - 17.0 g/dL   HCT 91.4 (L) 78.2 - 95.6 %   MCV 84.9 80.0 - 100.0 fL   MCH 28.7 26.0 - 34.0 pg   MCHC 33.8 30.0 - 36.0 g/dL   RDW 21.3 08.6 - 57.8 %   Platelets 354 150 - 400 K/uL   nRBC 0.0 0.0 - 0.2 %    Comment: Performed at [facility], [address], [place], [place] [address]  Creatinine, serum     Status: Abnormal   Collection Time: 05/24/23 11:49 PM  Result Value Ref Range   Creatinine, Ser 1.52 (H) 0.61 - 1.24 mg/dL   GFR, Estimated 49 (L) >60 mL/min    Comment: (NOTE) Calculated using the CKD-EPI Creatinine Equation (2021) Performed at [facility], [address], [place], [place] [address]   Glucose, capillary     Status: Abnormal   Collection Time: 05/25/23  8:04 AM  Result Value Ref Range   Glucose-Capillary 129 (H) 70 - 99 mg/dL    Comment: Glucose reference range applies only to samples taken after fasting for at least 8 hours.  Glucose, capillary     Status: Abnormal   Collection Time: 05/25/23 11:35 AM  Result Value  Ref Range   Glucose-Capillary 150 (H) 70 - 99 mg/dL    Comment: Glucose reference range applies only to samples taken after fasting for at least 8 hours.  CBC     Status:  Abnormal   Collection Time: 05/25/23  2:02 PM  Result Value Ref Range   WBC 14.6 (H) 4.0 - 10.5 K/uL   RBC 4.34 4.22 - 5.81 MIL/uL   Hemoglobin 12.5 (L) 13.0 - 17.0 g/dL   HCT 10.2 (L) 72.5 - 36.6 %   MCV 85.5 80.0 - 100.0 fL   MCH 28.8 26.0 - 34.0 pg   MCHC 33.7 30.0 - 36.0 g/dL   RDW 44.0 34.7 - 42.5 %   Platelets 321 150 - 400 K/uL   nRBC 0.0 0.0 - 0.2 %    Comment: Performed at [facility], [address] [place], [place] [address]  Basic metabolic panel     Status: Abnormal   Collection Time: 05/25/23  2:02 PM  Result Value Ref Range   Sodium 136 135 - 145 mmol/L   Potassium 3.6 3.5 - 5.1 mmol/L   Chloride 101 98 - 111 mmol/L   CO2 22 22 - 32 mmol/L   Glucose, Bld 154 (H) 70 - 99 mg/dL    Comment: Glucose reference range applies only to samples taken after fasting for at least 8 hours.   BUN 27 (H) 8 - 23 mg/dL   Creatinine, Ser 7.56 0.61 - 1.24 mg/dL   Calcium 8.3 (L) 8.9 - 10.3 mg/dL   GFR, Estimated >43 >32 mL/min    Comment: (NOTE) Calculated using the CKD-EPI Creatinine Equation (2021)    Anion gap 13 5 - 15    Comment: Performed at [facility], [address] [place], [place] [address]  Glucose, capillary     Status: Abnormal   Collection Time: 05/25/23  3:56 PM  Result Value Ref Range   Glucose-Capillary 171 (H) 70 - 99 mg/dL    Comment: Glucose reference range applies only to samples taken after fasting for at least 8 hours.  Glucose, capillary     Status: Abnormal   Collection Time: 05/25/23  7:37 PM  Result Value Ref Range   Glucose-Capillary 111 (H) 70 - 99 mg/dL    Comment: Glucose reference range applies only to samples taken after fasting for at least 8 hours.  Basic metabolic panel     Status: Abnormal   Collection Time: 05/26/23  4:03 AM  Result Value Ref Range   Sodium 133 (L) 135 - 145 mmol/L   Potassium 3.4 (L) 3.5 - 5.1 mmol/L   Chloride 104 98 - 111 mmol/L   CO2 21 (L) 22 - 32 mmol/L   Glucose, Bld 140 (H) 70 - 99 mg/dL     Comment: Glucose reference range applies only to samples taken after fasting for at least 8 hours.   BUN 25 (H) 8 - 23 mg/dL   Creatinine, Ser 4.16 (H) 0.61 - 1.24 mg/dL   Calcium 8.0 (L) 8.9 - 10.3 mg/dL   GFR, Estimated 52 (L) >60 mL/min    Comment: (NOTE) Calculated using the CKD-EPI Creatinine Equation (2021)    Anion gap 8 5 - 15    Comment: Performed at [facility], [address] [place], [place] [address]  CBC     Status: Abnormal   Collection Time: 05/26/23  4:03 AM  Result Value Ref Range   WBC 15.4 (H) 4.0 - 10.5 K/uL  RBC 3.88 (L) 4.22 - 5.81 MIL/uL   Hemoglobin 11.0 (L) 13.0 - 17.0 g/dL   HCT 72.5 (L) 36.6 - 44.0 %   MCV 85.3 80.0 - 100.0 fL   MCH 28.4 26.0 - 34.0 pg   MCHC 33.2 30.0 - 36.0 g/dL   RDW 34.7 42.5 - 95.6 %   Platelets 335 150 - 400 K/uL   nRBC 0.0 0.0 - 0.2 %    Comment: Performed at [facility], [address], [place], [place] [address]  Magnesium     Status: Abnormal   Collection Time: 05/26/23  4:03 AM  Result Value Ref Range   Magnesium 1.6 (L) 1.7 - 2.4 mg/dL    Comment: Performed at [facility], [address], [place], [place] [address]  Phosphorus     Status: None   Collection Time: 05/26/23  4:03 AM  Result Value Ref Range   Phosphorus 3.8 2.5 - 4.6 mg/dL    Comment: Performed at [facility], [address] [place], [place] [address]  Glucose, capillary     Status: Abnormal   Collection Time: 05/26/23  8:10 AM  Result Value Ref Range   Glucose-Capillary 187 (H) 70 - 99 mg/dL    Comment: Glucose reference range applies only to samples taken after fasting for at least 8 hours.    CT HUMERUS LEFT W CONTRAST  Result Date: 05/26/2023 CLINICAL DATA:  Left arm cellulitis follow-up. EXAM: CT OF THE UPPER LEFT EXTREMITY WITH CONTRAST TECHNIQUE: Multidetector CT imaging of the left upper arm, left forearm, and left hand was performed according to the standard protocol following intravenous contrast administration.  RADIATION DOSE REDUCTION: This exam was performed according to the departmental dose-optimization program which includes automated exposure control, adjustment of the mA and/or kV according to patient size and/or use of iterative reconstruction technique. CONTRAST:  75mL OMNIPAQUE IOHEXOL 350 MG/ML SOLN COMPARISON:  CT left upper arm and forearm dated May 16, 2023. FINDINGS: Bones/Joint/Cartilage No bony destruction or periosteal reaction. No acute fracture or dislocation. Sequelae of prior remote left acromioclavicular joint injury again noted. 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 Unchanged small elbow joint effusion. Ligaments Ligaments are suboptimally evaluated by CT. Muscles and Tendons Grossly intact. Worsening hypodensity and enlargement of the left triceps muscle with new fluid collection described below. Remaining muscles appear normal. Small lipomas within the left triceps and brachialis muscles again noted. Soft tissue Progressive severe circumferential skin thickening and soft tissue swelling the left arm and hand. New large irregular rim enhancing subcutaneous and intramuscular fluid collection involving the posterior arm and triceps muscle, extending from the mid upper arm to the mid forearm. This measures up to 3.3 x 6.7 x 29.8 cm (AP by transverse by CC) (series 9, image 63). Additional 1.2 x 1.2 x 5.0 cm rim enhancing fluid collection in the central triceps muscle (series 4, image 291; series 9, image 63). Smaller 4.4 x 1.1 x 5.2 cm rim enhancing fluid collection along the medial aspect of the proximal forearm (series 4, image 215). No subcutaneous emphysema. Possible new skin ulceration of the antecubital fossa (series 4, image 231). Multiple enlarged reactive left axillary lymph nodes again noted. Mild centrilobular emphysema in the visualized lungs. IMPRESSION: 1. Progressive severe cellulitis of the entire left arm and hand with new  large subcutaneous and intramuscular abscess involving the posterior arm and triceps muscle, extending from the mid upper arm to the mid forearm. Additional smaller  abscesses in the central triceps muscle and medial aspect of the proximal forearm. 2. Increased hypodensity and enlargement of the left triceps muscle, concerning for worsening infectious myositis. 3. Possible new skin ulceration of the antecubital fossa. Correlate with physical exam. 4. No evidence of osteomyelitis. 5.  Emphysema (ICD10-J43.9). Electronically Signed   By: Obie Dredge M.D.   On: 05/26/2023 09:51   CT FOREARM LEFT W CONTRAST  Result Date: 05/26/2023 CLINICAL DATA:  Left arm cellulitis follow-up. EXAM: CT OF THE UPPER LEFT EXTREMITY WITH CONTRAST TECHNIQUE: Multidetector CT imaging of the left upper arm, left forearm, and left hand was performed according to the standard protocol following intravenous contrast administration. RADIATION DOSE REDUCTION: This exam was performed according to the departmental dose-optimization program which includes automated exposure control, adjustment of the mA and/or kV according to patient size and/or use of iterative reconstruction technique. CONTRAST:  75mL OMNIPAQUE IOHEXOL 350 MG/ML SOLN COMPARISON:  CT left upper arm and forearm dated May 16, 2023. FINDINGS: Bones/Joint/Cartilage No bony destruction or periosteal reaction. No acute fracture or dislocation. Sequelae of prior remote left acromioclavicular joint injury again noted. 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 Unchanged small elbow joint effusion. Ligaments Ligaments are suboptimally evaluated by CT. Muscles and Tendons Grossly intact. Worsening hypodensity and enlargement of the left triceps muscle with new fluid collection described below. Remaining muscles appear normal. Small lipomas within the left triceps and brachialis muscles again noted. Soft tissue Progressive severe  circumferential skin thickening and soft tissue swelling the left arm and hand. New large irregular rim enhancing subcutaneous and intramuscular fluid collection involving the posterior arm and triceps muscle, extending from the mid upper arm to the mid forearm. This measures up to 3.3 x 6.7 x 29.8 cm (AP by transverse by CC) (series 9, image 63). Additional 1.2 x 1.2 x 5.0 cm rim enhancing fluid collection in the central triceps muscle (series 4, image 291; series 9, image 63). Smaller 4.4 x 1.1 x 5.2 cm rim enhancing fluid collection along the medial aspect of the proximal forearm (series 4, image 215). No subcutaneous emphysema. Possible new skin ulceration of the antecubital fossa (series 4, image 231). Multiple enlarged reactive left axillary lymph nodes again noted. Mild centrilobular emphysema in the visualized lungs. IMPRESSION: 1. Progressive severe cellulitis of the entire left arm and hand with new large subcutaneous and intramuscular abscess involving the posterior arm and triceps muscle, extending from the mid upper arm to the mid forearm. Additional smaller abscesses in the central triceps muscle and medial aspect of the proximal forearm. 2. Increased hypodensity and enlargement of the left triceps muscle, concerning for worsening infectious myositis. 3. Possible new skin ulceration of the antecubital fossa. Correlate with physical exam. 4. No evidence of osteomyelitis. 5.  Emphysema (ICD10-J43.9). Electronically Signed   By: Obie Dredge M.D.   On: 05/26/2023 09:51   CT HAND LEFT W CONTRAST  Result Date: 05/26/2023 CLINICAL DATA:  Left arm cellulitis follow-up. EXAM: CT OF THE UPPER LEFT EXTREMITY WITH CONTRAST TECHNIQUE: Multidetector CT imaging of the left upper arm, left forearm, and left hand was performed according to the standard protocol following intravenous contrast administration. RADIATION DOSE REDUCTION: This exam was performed according to the departmental dose-optimization program  which includes automated exposure control, adjustment of the mA and/or kV according to patient size and/or use of iterative reconstruction technique. CONTRAST:  75mL OMNIPAQUE IOHEXOL 350 MG/ML SOLN COMPARISON:  CT left upper arm and forearm dated May 16, 2023.  FINDINGS: Bones/Joint/Cartilage No bony destruction or periosteal reaction. No acute fracture or dislocation. Sequelae of prior remote left acromioclavicular joint injury again noted. 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 Unchanged small elbow joint effusion. Ligaments Ligaments are suboptimally evaluated by CT. Muscles and Tendons Grossly intact. Worsening hypodensity and enlargement of the left triceps muscle with new fluid collection described below. Remaining muscles appear normal. Small lipomas within the left triceps and brachialis muscles again noted. Soft tissue Progressive severe circumferential skin thickening and soft tissue swelling the left arm and hand. New large irregular rim enhancing subcutaneous and intramuscular fluid collection involving the posterior arm and triceps muscle, extending from the mid upper arm to the mid forearm. This measures up to 3.3 x 6.7 x 29.8 cm (AP by transverse by CC) (series 9, image 63). Additional 1.2 x 1.2 x 5.0 cm rim enhancing fluid collection in the central triceps muscle (series 4, image 291; series 9, image 63). Smaller 4.4 x 1.1 x 5.2 cm rim enhancing fluid collection along the medial aspect of the proximal forearm (series 4, image 215). No subcutaneous emphysema. Possible new skin ulceration of the antecubital fossa (series 4, image 231). Multiple enlarged reactive left axillary lymph nodes again noted. Mild centrilobular emphysema in the visualized lungs. IMPRESSION: 1. Progressive severe cellulitis of the entire left arm and hand with new large subcutaneous and intramuscular abscess involving the posterior arm and triceps muscle, extending  from the mid upper arm to the mid forearm. Additional smaller abscesses in the central triceps muscle and medial aspect of the proximal forearm. 2. Increased hypodensity and enlargement of the left triceps muscle, concerning for worsening infectious myositis. 3. Possible new skin ulceration of the antecubital fossa. Correlate with physical exam. 4. No evidence of osteomyelitis. 5.  Emphysema (ICD10-J43.9). Electronically Signed   By: Obie Dredge M.D.   On: 05/26/2023 09:51   VAS [place] UPPER EXTREMITY VENOUS DUPLEX  Result Date: 05/25/2023 UPPER VENOUS STUDY  Patient Name:  Justin Snow  Date of Exam:   05/25/2023 Medical Rec #: [id number]           Accession #:    [id number] Date of Birth: [birth year]           Patient Gender: M Patient Age:   43 years Exam Location:  [facility] Procedure:      VAS [place] UPPER EXTREMITY VENOUS DUPLEX Referring Phys: ALEXIS HUGELMEYER --------------------------------------------------------------------------------  Indications: Swelling, and Erythema Other Indications: LUE cellulitis. Limitations: Body habitus and poor ultrasound/tissue interface. Comparison Study: Previous exam on 05/16/2023 was negative for DVT Performing Technologist: Ernestene Mention RVT, RDMS  Examination Guidelines: A complete evaluation includes B-mode imaging, spectral Doppler, color Doppler, and power Doppler as needed of all accessible portions of each vessel. Bilateral testing is considered an integral part of a complete examination. Limited examinations for reoccurring indications may be performed as noted.  Right Findings: +----------+------------+---------+-----------+----------+-------+ RIGHT     CompressiblePhasicitySpontaneousPropertiesSummary +----------+------------+---------+-----------+----------+-------+ Subclavian    Full       Yes       Yes                      +----------+------------+---------+-----------+----------+-------+  Left Findings:  +----------+------------+---------+-----------+----------+--------------------+ LEFT      CompressiblePhasicitySpontaneousProperties      Summary        +----------+------------+---------+-----------+----------+--------------------+ IJV           Full       Yes       Yes                                   +----------+------------+---------+-----------+----------+--------------------+  Subclavian    Full       Yes       Yes                                   +----------+------------+---------+-----------+----------+--------------------+ Axillary      Full       Yes       Yes                                   +----------+------------+---------+-----------+----------+--------------------+ Brachial      Full       Yes       Yes                                   +----------+------------+---------+-----------+----------+--------------------+ Radial                                              not well visualized  +----------+------------+---------+-----------+----------+--------------------+ Ulnar                    Yes       Yes                   patent by                                                              color/doppler     +----------+------------+---------+-----------+----------+--------------------+ Cephalic      Full                                                       +----------+------------+---------+-----------+----------+--------------------+ Basilic       Full       Yes       Yes                                   +----------+------------+---------+-----------+----------+--------------------+  Summary:  Right: No evidence of thrombosis in the subclavian. Subcutaneous edema throughout LUE.  Left: No evidence of deep vein thrombosis in the upper extremity. No evidence of superficial vein thrombosis in the upper extremity.  *See table(s) above for measurements and observations.  Diagnosing physician: Sherald Hess MD Electronically  signed by Sherald Hess MD on 05/25/2023 at 1:14:08 PM.    Final    DG Elbow Complete Left  Result Date: 05/24/2023 CLINICAL DATA:  Arm swelling, infection/cellulitis, pain EXAM: LEFT ELBOW - COMPLETE 3+ VIEW COMPARISON:  05/15/2023 FINDINGS: Diffuse extremity soft tissue swelling/edema. Limited exam because of positioning. No acute osseous finding, fracture or definite large effusion. No acute joint abnormality. IMPRESSION: Diffuse soft tissue swelling/edema. No acute osseous finding by plain radiography. Electronically Signed   By: Judie Petit.  Shick M.D.   On: 05/24/2023 17:46    Review  of Systems  Constitutional:  Negative for chills, diaphoresis and fever.  HENT:  Negative for ear discharge, ear pain, hearing loss and tinnitus.   Eyes:  Negative for photophobia and pain.  Respiratory:  Negative for cough and shortness of breath.   Cardiovascular:  Negative for chest pain.  Gastrointestinal:  Negative for abdominal pain, nausea and vomiting.  Genitourinary:  Negative for dysuria, flank pain, frequency and urgency.  Musculoskeletal:  Positive for arthralgias (LUE). Negative for back pain, myalgias and neck pain.  Neurological:  Negative for dizziness and headaches.  Hematological:  Does not bruise/bleed easily.  Psychiatric/Behavioral:  The patient is not nervous/anxious.    Blood pressure 125/83, pulse 72, temperature 98.3 F (36.8 C), resp. rate 18, height 5\' 7"  (1.702 m), weight 99.8 kg, SpO2 100%. Physical Exam Constitutional:      General: He is not in acute distress.    Appearance: He is well-developed. He is not diaphoretic.  HENT:     Head: Normocephalic and atraumatic.  Eyes:     General: No scleral icterus.       Right eye: No discharge.        Left eye: No discharge.     Conjunctiva/sclera: Conjunctivae normal.  Cardiovascular:     Rate and Rhythm: Normal rate and regular rhythm.  Pulmonary:     Effort: Pulmonary effort is normal. No respiratory distress.  Musculoskeletal:      Cervical back: Normal range of motion.     Comments: Left shoulder, elbow, wrist, digits- cellulitis post upper arm and post/lat FA, fluctuance noted post upper/FA, mod TTP, mod edema, no instability, no blocks to motion  Sens  Ax/R/M/U intact  Mot   Ax/ R/ PIN/ M/ AIN/ U intact  Rad 2+  Skin:    General: Skin is warm and dry.  Neurological:     Mental Status: He is alert.  Psychiatric:        Mood and Affect: Mood normal.        Behavior: Behavior normal.     Assessment/Plan: Left arm abscess -- Plan I&D today with Dr. Carola Frost. Please keep NPO.    Freeman Caldron, PA-C Orthopedic Surgery [phone] 05/26/2023, 11:29 AM

## 2023-05-26 NOTE — Transfer of Care (Signed)
Immediate Anesthesia Transfer of Care Note  Patient: Justin Snow  Procedure(s) Performed: IRRIGATION AND DEBRIDEMENT LEFT UPPER EXTREMITY (Left)  Patient Location: PACU  Anesthesia Type:General  Level of Consciousness: drowsy, patient cooperative, and responds to stimulation  Airway & Oxygen Therapy: Patient Spontanous Breathing and Patient connected to nasal cannula oxygen  Post-op Assessment: Report given to RN and Post -op Vital signs reviewed and stable  Post vital signs: Reviewed and stable  Last Vitals:  Vitals Value Taken Time  BP 115/75 05/26/23 1534  Temp    Pulse 67 05/26/23 1536  Resp 10 05/26/23 1536  SpO2 97 % 05/26/23 1536  Vitals shown include unfiled device data.  Last Pain:  Vitals:   05/26/23 1331  TempSrc:   PainSc: 0-No pain      Patients Stated Pain Goal: 0 (05/25/23 0341)  Complications: No notable events documented.

## 2023-05-26 NOTE — Progress Notes (Signed)
CSW received call from Jenna/[facility] case mgr. [phone])  Pt has bed available at [facility] prison, who is requesting a call from the treating MD at [facility] to Dr Camelia Phenes [phone]) at [facility] to discuss the transfer.   CSW spoke with RN, informed that pt scheduled for OR today.  Called Jenna back with update.  She will inform [facility] and central [facility].  Bed is available as soon as pt is stable for transfer.     Daleen Squibb, MSW, LCSW 8/29/202411:58 AM

## 2023-05-26 NOTE — Plan of Care (Signed)

## 2023-05-26 NOTE — Anesthesia Preprocedure Evaluation (Signed)
Anesthesia Evaluation  Patient identified by MRN, date of birth, ID band Patient awake    Reviewed: Allergy & Precautions, H&P , NPO status , Patient's Chart, lab work & pertinent test results  Airway Mallampati: II  TM Distance: >3 FB Neck ROM: Full    Dental no notable dental hx.    Pulmonary asthma , Current Smoker and Patient abstained from smoking.   Pulmonary exam normal breath sounds clear to auscultation       Cardiovascular hypertension, Pt. on medications negative cardio ROS Normal cardiovascular exam Rhythm:Regular Rate:Normal     Neuro/Psych CVA  negative psych ROS   GI/Hepatic negative GI ROS, Neg liver ROS,,,  Endo/Other  negative endocrine ROSdiabetes, Type 2    Renal/GU Renal InsufficiencyRenal disease  negative genitourinary   Musculoskeletal negative musculoskeletal ROS (+)    Abdominal   Peds negative pediatric ROS (+)  Hematology negative hematology ROS (+)   Anesthesia Other Findings   Reproductive/Obstetrics negative OB ROS                             Anesthesia Physical Anesthesia Plan  ASA: 3 and emergent  Anesthesia Plan: General   Post-op Pain Management:    Induction: Intravenous, Rapid sequence and Cricoid pressure planned  PONV Risk Score and Plan: 1 and Ondansetron and Treatment may vary due to age or medical condition  Airway Management Planned: Oral ETT  Additional Equipment:   Intra-op Plan:   Post-operative Plan: Extubation in OR  Informed Consent: I have reviewed the patients History and Physical, chart, labs and discussed the procedure including the risks, benefits and alternatives for the proposed anesthesia with the patient or authorized representative who has indicated his/her understanding and acceptance.     Dental advisory given  Plan Discussed with: CRNA  Anesthesia Plan Comments:        Anesthesia Quick Evaluation

## 2023-05-26 NOTE — Brief Op Note (Signed)
05/26/2023  3:41 PM  PATIENT:  Justin Snow  67 y.o. male  PRE-OPERATIVE DIAGNOSIS:  abscess  #[id number]

## 2023-05-26 NOTE — Anesthesia Postprocedure Evaluation (Signed)
Anesthesia Post Note  Patient: Justin Snow  Procedure(s) Performed: IRRIGATION AND DEBRIDEMENT LEFT UPPER EXTREMITY (Left)     Patient location during evaluation: PACU Anesthesia Type: General Level of consciousness: awake and alert Pain management: pain level controlled Vital Signs Assessment: post-procedure vital signs reviewed and stable Respiratory status: spontaneous breathing, nonlabored ventilation and respiratory function stable Cardiovascular status: blood pressure returned to baseline and stable Postop Assessment: no apparent nausea or vomiting Anesthetic complications: no  No notable events documented.  Last Vitals:  Vitals:   05/26/23 1545 05/26/23 1600  BP: 122/62   Pulse: 65 (!) 59  Resp: 14 13  Temp:  (!) 36.1 C  SpO2: 95% 96%    Last Pain:  Vitals:   05/26/23 1600  TempSrc:   PainSc: Asleep                 Justin Snow,W. EDMOND

## 2023-05-27 ENCOUNTER — Encounter (HOSPITAL_COMMUNITY): Payer: Self-pay | Admitting: Orthopedic Surgery

## 2023-05-27 DIAGNOSIS — L03114 Cellulitis of left upper limb: Secondary | ICD-10-CM | POA: Diagnosis not present

## 2023-05-27 LAB — BASIC METABOLIC PANEL
Anion gap: 7 (ref 5–15)
BUN: 26 mg/dL — ABNORMAL HIGH (ref 8–23)
CO2: 19 mmol/L — ABNORMAL LOW (ref 22–32)
Calcium: 8 mg/dL — ABNORMAL LOW (ref 8.9–10.3)
Chloride: 106 mmol/L (ref 98–111)
Creatinine, Ser: 1.32 mg/dL — ABNORMAL HIGH (ref 0.61–1.24)
GFR, Estimated: 58 mL/min — ABNORMAL LOW (ref >60)
Glucose, Bld: 142 mg/dL — ABNORMAL HIGH (ref 70–99)
Potassium: 3.9 mmol/L (ref 3.5–5.1)
Sodium: 132 mmol/L — ABNORMAL LOW (ref 135–145)

## 2023-05-27 LAB — CBC
HCT: 36.5 % — ABNORMAL LOW (ref 39.0–52.0)
Hemoglobin: 12.5 g/dL — ABNORMAL LOW (ref 13.0–17.0)
MCH: 28.8 pg (ref 26.0–34.0)
MCHC: 34.2 g/dL (ref 30.0–36.0)
MCV: 84.1 fL (ref 80.0–100.0)
Platelets: 207 10*3/uL (ref 150–400)
RBC: 4.34 MIL/uL (ref 4.22–5.81)
RDW: 13.3 % (ref 11.5–15.5)
WBC: 21.7 10*3/uL — ABNORMAL HIGH (ref 4.0–10.5)
nRBC: 0 % (ref 0.0–0.2)

## 2023-05-27 LAB — GLUCOSE, CAPILLARY
Glucose-Capillary: 138 mg/dL — ABNORMAL HIGH (ref 70–99)
Glucose-Capillary: 130 mg/dL — ABNORMAL HIGH (ref 70–99)
Glucose-Capillary: 82 mg/dL (ref 70–99)
Glucose-Capillary: 96 mg/dL (ref 70–99)
Glucose-Capillary: 86 mg/dL (ref 70–99)

## 2023-05-27 LAB — MAGNESIUM: Magnesium: 1.5 mg/dL — ABNORMAL LOW (ref 1.7–2.4)

## 2023-05-27 MED ORDER — CHLORHEXIDINE GLUCONATE CLOTH 2 % EX PADS
6.0000 | MEDICATED_PAD | Freq: Every day | CUTANEOUS | Status: DC
  Administered 2023-05-27 – 2023-06-02 (×7): 6 via TOPICAL

## 2023-05-27 MED ORDER — SODIUM CHLORIDE 0.9 % IV SOLN
8.0000 mg/kg | Freq: Every day | INTRAVENOUS | Status: DC
  Administered 2023-05-27 – 2023-06-02 (×7): 650 mg via INTRAVENOUS
  Filled 2023-05-27 (×8): qty 13

## 2023-05-27 MED ORDER — MAGNESIUM SULFATE 4 GM/100ML IV SOLN
4.0000 g | Freq: Once | INTRAVENOUS | Status: AC
  Administered 2023-05-27: 4 g via INTRAVENOUS
  Filled 2023-05-27: qty 100

## 2023-05-27 NOTE — Progress Notes (Signed)
PROGRESS NOTE    Justin Snow  NWG:[id number] DOB: 17-Feb-1956 DOA: 05/24/2023 PCP: Pcp, No     Brief Narrative:  67 year old incarcerated male with history of asthma, DM 2, HTN, CKD stage III A, CVA presents to the ED with complaints of arm swelling.  Patient was [facility] from the hospital to jail on August 28 after being treated for cellulitis of the left forearm.  At that time patient completed course of vancomycin and Unasyn and eventually transition to Augmentin and doxycycline.  Returns back to the hospital due to worsening pain and swelling.  X-ray of the elbow shows diffuse edema.  CT performed eventually showed worsening cellulitis with larger abscess therefore orthopedic consulted who plans on the I&D     Assessment & Plan:  Principal Problem:   Left arm cellulitis   Left upper extremity cellulitis with progressing abscess - Failed outpatient antibiotic treatment.  Currently on broad-spectrum.  CT scan of left upper extremity showed severe cellulitis with progressive abscess/inflammation.  Underwent extensive I&D by orthopedic on 8/29 with application of wound VAC.  Anticipate return to the OR on 9/3.  In the meantime ID consulted  On 8/29 at the request of management, I discussed case with patient's Prison system provider Dr Camelia Phenes on [phone]. He advised me to complete his surgery here in the hospital and once patient is medically stable he can be transferred back to prison system on IV or p.o. antibiotic which ever is necessary   History of asthma - Bronchodilators as needed   Diabetes mellitus type 2 - Diabetic diet.  Sliding scale and Accu-Cheks. -Semglee, sliding scale.  Adjust as necessary   CKD stage IIIa -Creatinine around baseline of 1.5   Essential hypertension -Norvasc, Coreg, HCTZ, lisinopril.  IV as needed   History of CVA -MRI was positive for CVA in July 2024.  On statin   Depression -Cymbalta   DVT prophylaxis: enoxaparin (LOVENOX)  injection 40 mg Start: 05/25/23 1000 SCDs Start: 05/24/23 2247 Code Status: Full code Family Communication:   On going surgical and Medical management for his LUE. Will need to go back to the OR on 9/3           Subjective: Doing ok no new complaints   Examination:  General exam: Appears calm and comfortable  Respiratory system: Clear to auscultation. Respiratory effort normal. Cardiovascular system: S1 & S2 heard, RRR. No JVD, murmurs, rubs, gallops or clicks. No pedal edema. Gastrointestinal system: Abdomen is nondistended, soft and nontender. No organomegaly or masses felt. Normal bowel sounds heard. Central nervous system: Alert and oriented. No focal neurological deficits. Extremities: Symmetric 5 x 5 power. Skin: RUE dressing in wound vac in place Psychiatry: Judgement and insight appear normal. Mood & affect appropriate.       Diet Orders (From admission, onward)     Start     Ordered   05/26/23 1603  Diet Carb Modified Fluid consistency: Thin; Room service appropriate? Yes  Diet effective now       Question Answer Comment  Diet-HS Snack? Nothing   Calorie Level Medium 1600-2000   Fluid consistency: Thin   Room service appropriate? Yes      05/26/23 1602            Objective: Vitals:   05/26/23 2000 05/26/23 2357 05/27/23 0533 05/27/23 0810  BP: 138/61 (!) 136/58 (!) 175/72 135/62  Pulse: (!) 55 60 81 82  Resp: 18 18 18 18   Temp:  97.6 F (36.4  C) (!) 97.5 F (36.4 C) 97.8 F (36.6 C)  TempSrc:  Oral Oral Oral  SpO2: 97% 97% 99% 99%  Weight:      Height:        Intake/Output Summary (Last 24 hours) at 05/27/2023 1236 Last data filed at 05/27/2023 0810 Gross per 24 hour  Intake 1350 ml  Output 2050 ml  Net -700 ml   Filed Weights   05/24/23 1928 05/26/23 1321  Weight: 99.8 kg 99.8 kg    Scheduled Meds:  acetaminophen  1,000 mg Oral Once   amLODipine  10 mg Oral Daily   atorvastatin  10 mg Oral QHS   carvedilol  6.25 mg Oral BID WC    Chlorhexidine Gluconate Cloth  6 each Topical Q0600   DULoxetine  30 mg Oral BID   enoxaparin (LOVENOX) injection  40 mg Subcutaneous Daily   hydrochlorothiazide  12.5 mg Oral Daily   insulin aspart  0-15 Units Subcutaneous TID WC   insulin aspart  0-5 Units Subcutaneous QHS   insulin glargine-yfgn  15 Units Subcutaneous Daily   lisinopril  40 mg Oral Daily   mometasone-formoterol  2 puff Inhalation BID   pantoprazole  40 mg Oral Daily   sodium chloride flush  3 mL Intravenous Q12H   Continuous Infusions:  sodium chloride 75 mL/hr at 05/27/23 0614   ceFEPime (MAXIPIME) IV 2 g (05/27/23 0953)   magnesium sulfate bolus IVPB 4 g (05/27/23 1117)   vancomycin 1,000 mg (05/26/23 2103)    Nutritional status     Body mass index is 34.46 kg/m.  Data Reviewed:   CBC: Recent Labs  Lab 05/24/23 1705 05/24/23 2349 05/25/23 1402 05/26/23 0403 05/27/23 0441  WBC 20.5* 19.7* 14.6* 15.4* 21.7*  NEUTROABS 16.9*  --   --   --   --   HGB 11.8* 11.2* 12.5* 11.0* 12.5*  HCT 35.5* 33.1* 37.1* 33.1* 36.5*  MCV 85.5 84.9 85.5 85.3 84.1  PLT 388 354 321 335 207   Basic Metabolic Panel: Recent Labs  Lab 05/24/23 1705 05/24/23 2349 05/25/23 1402 05/26/23 0403 05/27/23 0441  NA 134*  --  136 133* 132*  K 3.9  --  3.6 3.4* 3.9  CL 101  --  101 104 106  CO2 23  --  22 21* 19*  GLUCOSE 155*  --  154* 140* 142*  BUN 26*  --  27* 25* 26*  CREATININE 1.51* 1.52* 1.20 1.45* 1.32*  CALCIUM 8.2*  --  8.3* 8.0* 8.0*  MG  --   --   --  1.6* 1.5*  PHOS  --   --   --  3.8  --    GFR: Estimated Creatinine Clearance: 59.5 mL/min (A) (by C-G formula based on SCr of 1.32 mg/dL (H)). Liver Function Tests: Recent Labs  Lab 05/24/23 1705  AST 17  ALT 14  ALKPHOS 114  BILITOT 0.8  PROT 5.9*  ALBUMIN 2.1*   No results for input(s): "LIPASE", "AMYLASE" in the last 168 hours. No results for input(s): "AMMONIA" in the last 168 hours. Coagulation Profile: No results for input(s): "INR",  "PROTIME" in the last 168 hours. Cardiac Enzymes: No results for input(s): "CKTOTAL", "CKMB", "CKMBINDEX", "TROPONINI" in the last 168 hours. BNP (last 3 results) No results for input(s): "PROBNP" in the last 8760 hours. HbA1C: No results for input(s): "HGBA1C" in the last 72 hours. CBG: Recent Labs  Lab 05/26/23 1319 05/26/23 1652 05/26/23 2048 05/27/23 0755 05/27/23 1136  GLUCAP 110*  85 116* 138* 130*   Lipid Profile: No results for input(s): "CHOL", "HDL", "LDLCALC", "TRIG", "CHOLHDL", "LDLDIRECT" in the last 72 hours. Thyroid Function Tests: No results for input(s): "TSH", "T4TOTAL", "FREET4", "T3FREE", "THYROIDAB" in the last 72 hours. Anemia Panel: No results for input(s): "VITAMINB12", "FOLATE", "FERRITIN", "TIBC", "IRON", "RETICCTPCT" in the last 72 hours. Sepsis Labs: Recent Labs  Lab 05/24/23 1712 05/24/23 1910  LATICACIDVEN 1.0 1.7    Recent Results (from the past 240 hour(s))  Blood culture (routine x 2)     Status: None (Preliminary result)   Collection Time: 05/24/23  5:15 PM   Specimen: BLOOD  Result Value Ref Range Status   Specimen Description BLOOD LEFT ANTECUBITAL  Final   Special Requests   Final    BOTTLES DRAWN AEROBIC ONLY Blood Culture adequate volume   Culture   Final    NO GROWTH 3 DAYS Performed at [facility], [address], [place], [place] [address]    Report Status PENDING  Incomplete  Blood culture (routine x 2)     Status: None (Preliminary result)   Collection Time: 05/24/23  6:57 PM   Specimen: BLOOD RIGHT HAND  Result Value Ref Range Status   Specimen Description BLOOD RIGHT HAND  Final   Special Requests   Final    BOTTLES DRAWN AEROBIC ONLY Blood Culture results may not be optimal due to an inadequate volume of blood received in culture bottles   Culture   Final    NO GROWTH 3 DAYS Performed at [facility], [address], [place], [place] [address]    Report Status PENDING  Incomplete  Aerobic/Anaerobic  Culture w Gram Stain (surgical/deep wound)     Status: None (Preliminary result)   Collection Time: 05/26/23  2:24 PM   Specimen: Abscess  Result Value Ref Range Status   Specimen Description ABSCESS LEFT ARM  Final   Special Requests NONE  Final   Gram Stain   Final    ABUNDANT WBC PRESENT, PREDOMINANTLY PMN NO ORGANISMS SEEN    Culture   Final    NO GROWTH < 24 HOURS Performed at [facility], [address] [place], [place] [address]    Report Status PENDING  Incomplete  Aerobic/Anaerobic Culture w Gram Stain (surgical/deep wound)     Status: None (Preliminary result)   Collection Time: 05/26/23  3:14 PM   Specimen: Soft Tissue, Other  Result Value Ref Range Status   Specimen Description TISSUE LEFT ARM  Final   Special Requests FROM LEFT ARM ABSCESS  Final   Gram Stain   Final    ABUNDANT WBC PRESENT, PREDOMINANTLY PMN FEW GRAM POSITIVE COCCI IN PAIRS IN CHAINS Gram Stain Report Called to,Read Back By and Verified With: RN Leslye Peer [phone] @ [phone] FH    Culture   Final    NO GROWTH < 24 HOURS Performed at [facility], [address] [place], [place] [address]    Report Status PENDING  Incomplete         Radiology Studies: CT HUMERUS LEFT W CONTRAST  Result Date: 05/26/2023 CLINICAL DATA:  Left arm cellulitis follow-up. EXAM: CT OF THE UPPER LEFT EXTREMITY WITH CONTRAST TECHNIQUE: Multidetector CT imaging of the left upper arm, left forearm, and left hand was performed according to the standard protocol following intravenous contrast administration. RADIATION DOSE REDUCTION: This exam was performed according to the departmental dose-optimization program which includes automated exposure control, adjustment of the  mA and/or kV according to patient size and/or use of iterative reconstruction technique. CONTRAST:  75mL OMNIPAQUE IOHEXOL 350 MG/ML SOLN COMPARISON:  CT left upper arm and forearm dated May 16, 2023. FINDINGS: Bones/Joint/Cartilage No bony  destruction or periosteal reaction. No acute fracture or dislocation. Sequelae of prior remote left acromioclavicular joint injury again noted. 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 Unchanged small elbow joint effusion. Ligaments Ligaments are suboptimally evaluated by CT. Muscles and Tendons Grossly intact. Worsening hypodensity and enlargement of the left triceps muscle with new fluid collection described below. Remaining muscles appear normal. Small lipomas within the left triceps and brachialis muscles again noted. Soft tissue Progressive severe circumferential skin thickening and soft tissue swelling the left arm and hand. New large irregular rim enhancing subcutaneous and intramuscular fluid collection involving the posterior arm and triceps muscle, extending from the mid upper arm to the mid forearm. This measures up to 3.3 x 6.7 x 29.8 cm (AP by transverse by CC) (series 9, image 63). Additional 1.2 x 1.2 x 5.0 cm rim enhancing fluid collection in the central triceps muscle (series 4, image 291; series 9, image 63). Smaller 4.4 x 1.1 x 5.2 cm rim enhancing fluid collection along the medial aspect of the proximal forearm (series 4, image 215). No subcutaneous emphysema. Possible new skin ulceration of the antecubital fossa (series 4, image 231). Multiple enlarged reactive left axillary lymph nodes again noted. Mild centrilobular emphysema in the visualized lungs. IMPRESSION: 1. Progressive severe cellulitis of the entire left arm and hand with new large subcutaneous and intramuscular abscess involving the posterior arm and triceps muscle, extending from the mid upper arm to the mid forearm. Additional smaller abscesses in the central triceps muscle and medial aspect of the proximal forearm. 2. Increased hypodensity and enlargement of the left triceps muscle, concerning for worsening infectious myositis. 3. Possible new skin ulceration of the  antecubital fossa. Correlate with physical exam. 4. No evidence of osteomyelitis. 5.  Emphysema (ICD10-J43.9). Electronically Signed   By: Obie Dredge M.D.   On: 05/26/2023 09:51   CT FOREARM LEFT W CONTRAST  Result Date: 05/26/2023 CLINICAL DATA:  Left arm cellulitis follow-up. EXAM: CT OF THE UPPER LEFT EXTREMITY WITH CONTRAST TECHNIQUE: Multidetector CT imaging of the left upper arm, left forearm, and left hand was performed according to the standard protocol following intravenous contrast administration. RADIATION DOSE REDUCTION: This exam was performed according to the departmental dose-optimization program which includes automated exposure control, adjustment of the mA and/or kV according to patient size and/or use of iterative reconstruction technique. CONTRAST:  75mL OMNIPAQUE IOHEXOL 350 MG/ML SOLN COMPARISON:  CT left upper arm and forearm dated May 16, 2023. FINDINGS: Bones/Joint/Cartilage No bony destruction or periosteal reaction. No acute fracture or dislocation. Sequelae of prior remote left acromioclavicular joint injury again noted. 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 Unchanged small elbow joint effusion. Ligaments Ligaments are suboptimally evaluated by CT. Muscles and Tendons Grossly intact. Worsening hypodensity and enlargement of the left triceps muscle with new fluid collection described below. Remaining muscles appear normal. Small lipomas within the left triceps and brachialis muscles again noted. Soft tissue Progressive severe circumferential skin thickening and soft tissue swelling the left arm and hand. New large irregular rim enhancing subcutaneous and intramuscular fluid collection involving the posterior arm and triceps muscle, extending from the mid upper arm to the mid forearm. This measures up to 3.3 x 6.7 x 29.8 cm (AP by transverse by  CC) (series 9, image 63). Additional 1.2 x 1.2 x 5.0 cm rim enhancing fluid  collection in the central triceps muscle (series 4, image 291; series 9, image 63). Smaller 4.4 x 1.1 x 5.2 cm rim enhancing fluid collection along the medial aspect of the proximal forearm (series 4, image 215). No subcutaneous emphysema. Possible new skin ulceration of the antecubital fossa (series 4, image 231). Multiple enlarged reactive left axillary lymph nodes again noted. Mild centrilobular emphysema in the visualized lungs. IMPRESSION: 1. Progressive severe cellulitis of the entire left arm and hand with new large subcutaneous and intramuscular abscess involving the posterior arm and triceps muscle, extending from the mid upper arm to the mid forearm. Additional smaller abscesses in the central triceps muscle and medial aspect of the proximal forearm. 2. Increased hypodensity and enlargement of the left triceps muscle, concerning for worsening infectious myositis. 3. Possible new skin ulceration of the antecubital fossa. Correlate with physical exam. 4. No evidence of osteomyelitis. 5.  Emphysema (ICD10-J43.9). Electronically Signed   By: Obie Dredge M.D.   On: 05/26/2023 09:51   CT HAND LEFT W CONTRAST  Result Date: 05/26/2023 CLINICAL DATA:  Left arm cellulitis follow-up. EXAM: CT OF THE UPPER LEFT EXTREMITY WITH CONTRAST TECHNIQUE: Multidetector CT imaging of the left upper arm, left forearm, and left hand was performed according to the standard protocol following intravenous contrast administration. RADIATION DOSE REDUCTION: This exam was performed according to the departmental dose-optimization program which includes automated exposure control, adjustment of the mA and/or kV according to patient size and/or use of iterative reconstruction technique. CONTRAST:  75mL OMNIPAQUE IOHEXOL 350 MG/ML SOLN COMPARISON:  CT left upper arm and forearm dated May 16, 2023. FINDINGS: Bones/Joint/Cartilage No bony destruction or periosteal reaction. No acute fracture or dislocation. Sequelae of prior remote  left acromioclavicular joint injury again noted. 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 Unchanged small elbow joint effusion. Ligaments Ligaments are suboptimally evaluated by CT. Muscles and Tendons Grossly intact. Worsening hypodensity and enlargement of the left triceps muscle with new fluid collection described below. Remaining muscles appear normal. Small lipomas within the left triceps and brachialis muscles again noted. Soft tissue Progressive severe circumferential skin thickening and soft tissue swelling the left arm and hand. New large irregular rim enhancing subcutaneous and intramuscular fluid collection involving the posterior arm and triceps muscle, extending from the mid upper arm to the mid forearm. This measures up to 3.3 x 6.7 x 29.8 cm (AP by transverse by CC) (series 9, image 63). Additional 1.2 x 1.2 x 5.0 cm rim enhancing fluid collection in the central triceps muscle (series 4, image 291; series 9, image 63). Smaller 4.4 x 1.1 x 5.2 cm rim enhancing fluid collection along the medial aspect of the proximal forearm (series 4, image 215). No subcutaneous emphysema. Possible new skin ulceration of the antecubital fossa (series 4, image 231). Multiple enlarged reactive left axillary lymph nodes again noted. Mild centrilobular emphysema in the visualized lungs. IMPRESSION: 1. Progressive severe cellulitis of the entire left arm and hand with new large subcutaneous and intramuscular abscess involving the posterior arm and triceps muscle, extending from the mid upper arm to the mid forearm. Additional smaller abscesses in the central triceps muscle and medial aspect of the proximal forearm. 2. Increased hypodensity and enlargement of the left triceps muscle, concerning for worsening infectious myositis. 3. Possible new skin ulceration of the antecubital fossa. Correlate with physical exam. 4. No evidence of osteomyelitis. 5.  Emphysema  (  ICD10-J43.9). Electronically Signed   By: Obie Dredge M.D.   On: 05/26/2023 09:51           LOS: 2 days   Time spent= 35 mins    Miguel Rota, MD [facility]  If 7PM-7AM, please contact night-coverage  05/27/2023, 12:36 PM

## 2023-05-27 NOTE — Consult Note (Signed)
[facility]                                                                                        Patient Identification: Patient Name: Justin Snow MRN: [id number] Admit Date: 05/24/2023  3:48 PM Today's Date: 05/27/2023 Reason for consult: Cellulitis/abscess Requesting provider: Dr. 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 Drue Second will be covering until Monday.  I am back on Tuesday  Rest of the management as per the primary team. Please call with questions or concerns.  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  Reported subjective fever and chills including purulent drainage coming from multiple areas of  macerated skin on the left elbow.  Patient was recently discharged from the hospital on 8/20 after being treated for cellulitis of left upper extremity, received IV vancomycin and Unasyn which was transitioned to doxycycline and Augmentin for 7 days on discharge  At ED, afebrile Labs remarkable for NA 134, creatinine at 1.51, albumin 2.1, WBC elevated to 20.5 Received IV vancomycin, cefepime, Tylenol Imagings as below and reviewed S/p 8/29 I&D of abscess of left arm, forearm, bursectomy of left elbow and application of large wound VAC  ROS: unable as patient is sleeping and unwilling to talk  Past Medical History:  Diagnosis Date   Asthma    Diabetes mellitus without complication (HCC)    Hypertension    Past Surgical History:  Procedure Laterality Date   I & D EXTREMITY Left 05/26/2023   Procedure: IRRIGATION AND DEBRIDEMENT LEFT UPPER EXTREMITY;  Surgeon: Myrene Galas, MD;  Location: [facility] OR;  Service: Orthopedics;  Laterality: Left;     Scheduled Meds:  acetaminophen  1,000 mg Oral Once   amLODipine  10 mg Oral Daily   atorvastatin  10 mg Oral QHS   carvedilol  6.25 mg Oral BID WC   Chlorhexidine Gluconate Cloth  6 each Topical Q0600   DULoxetine  30 mg Oral BID   enoxaparin (LOVENOX) injection  40 mg Subcutaneous Daily   hydrochlorothiazide  12.5 mg Oral Daily   insulin aspart  0-15 Units Subcutaneous TID WC   insulin aspart  0-5 Units Subcutaneous QHS   insulin glargine-yfgn  15 Units Subcutaneous Daily   lisinopril  40 mg Oral Daily   mometasone-formoterol  2 puff Inhalation BID   pantoprazole  40 mg Oral Daily   sodium chloride flush  3 mL Intravenous Q12H   Continuous Infusions:  sodium chloride 75 mL/hr at 05/27/23 8657   ceFEPime (MAXIPIME) IV 2 g (05/26/23 2059)   vancomycin 1,000 mg (05/26/23 2103)   PRN Meds:.acetaminophen **OR** acetaminophen, alum & mag hydroxide-simeth, bisacodyl, guaiFENesin, hydrALAZINE, HYDROcodone-acetaminophen, ipratropium-albuterol,  metoprolol tartrate, morphine injection, ondansetron **OR** ondansetron (ZOFRAN) IV, mouth rinse, senna-docusate, sodium chloride flush, traZODone  No Known Allergies  Social History   Socioeconomic History   Marital status: Divorced    Spouse name: Not on file   Number of children: 4   Years of education: Not on file   Highest education level: Not on file  Occupational History   Not on file  Tobacco Use   Smoking status: Every Day    Current packs/day: 0.50    Types: Cigarettes   Smokeless tobacco: Never  Vaping Use   Vaping status: Never Used  Substance and Sexual Activity   Alcohol use: Not Currently    Comment: rare   Drug use: Not Currently   Sexual activity: Not on file  Other Topics Concern   Not on file  Social History Narrative   Not on file   Social Determinants of Health   Financial Resource Strain: Not on file  Food Insecurity: No Food Insecurity (05/24/2023)   Hunger Vital Sign    Worried About Running Out of Food in the Last Year: Never true    Ran Out of Food in the Last Year: Never true  Recent Concern: Food Insecurity - Food Insecurity Present (04/28/2023)   Hunger Vital Sign    Worried About Radiation protection practitioner of Food in the Last Year: Sometimes true    Ran Out of Food in the Last Year: Sometimes true  Transportation Needs: No Transportation Needs (05/24/2023)   PRAPARE - Administrator, Civil Service (Medical): No    Lack of Transportation (Non-Medical): No  Recent Concern: Transportation Needs - Unmet Transportation Needs (04/28/2023)   PRAPARE - Administrator, Civil Service (Medical): Yes    Lack of Transportation (Non-Medical): Yes  Physical Activity: Not on file  Stress: Not on file  Social Connections: Not on file  Intimate Partner Violence: Not At Risk (05/24/2023)   Humiliation, Afraid, Rape, and Kick questionnaire    Fear of Current or Ex-Partner: No    Emotionally Abused: No    Physically Abused: No    Sexually Abused: No    Family History  Problem Relation Age of Onset   Heart disease Father    Vitals BP (!) 175/72 (BP Location: Right Arm)   Pulse 81   Temp (!) 97.5 F (36.4 C) (Oral)   Resp 18   Ht 5\' 7"  (1.702 m)   Wt 99.8 kg   SpO2 99%   BMI 34.46 kg/m    Physical Exam Constitutional: Elderly male lying in the bed and sleeping.  He is arousable to voice but does not wake up, wanting to sleep more    Comments:   Cardiovascular:     Rate and Rhythm: Normal rate and regular rhythm.     Heart sounds: s1s2  Pulmonary:     Effort: Pulmonary effort is normal.  Comments: Normal lung sounds  Abdominal:     Palpations: Abdomen is soft.     Tenderness: Nondistended and nontender  Musculoskeletal:        General: No swelling or tenderness in peripheral joints.  Left upper extremity is bandaged in a surgical bandage.  Mild swelling of the left hand.   Skin:    Comments: No rashes, unable to examine back  Neurological:     General: Cefepime but arousable to voice and wanting to sleep   Pertinent Microbiology Results for orders placed or performed during the hospital encounter of 05/24/23  Blood culture (routine x 2)     Status: None (Preliminary result)   Collection Time: 05/24/23  5:15 PM   Specimen: BLOOD  Result Value Ref Range Status   Specimen Description BLOOD LEFT ANTECUBITAL  Final   Special Requests   Final    BOTTLES DRAWN AEROBIC ONLY Blood Culture adequate volume   Culture   Final    NO GROWTH 2 DAYS Performed at [facility], [address], [place], [place] [address]    Report Status PENDING  Incomplete  Blood culture (routine x 2)     Status: None (Preliminary result)   Collection Time: 05/24/23  6:57 PM   Specimen: BLOOD RIGHT HAND  Result Value Ref Range Status   Specimen Description BLOOD RIGHT HAND  Final   Special Requests   Final    BOTTLES DRAWN AEROBIC ONLY Blood Culture results may not be optimal due to an inadequate volume of blood received in  culture bottles   Culture   Final    NO GROWTH 2 DAYS Performed at [facility], [address] [place], [place] [address]    Report Status PENDING  Incomplete  Aerobic/Anaerobic Culture w Gram Stain (surgical/deep wound)     Status: None (Preliminary result)   Collection Time: 05/26/23  2:24 PM   Specimen: Abscess  Result Value Ref Range Status   Specimen Description ABSCESS LEFT ARM  Final   Special Requests NONE  Final   Gram Stain   Final    ABUNDANT WBC PRESENT, PREDOMINANTLY PMN NO ORGANISMS SEEN Performed at [facility], [address] [place], [place] [address]    Culture PENDING  Incomplete   Report Status PENDING  Incomplete  Aerobic/Anaerobic Culture w Gram Stain (surgical/deep wound)     Status: None (Preliminary result)   Collection Time: 05/26/23  3:14 PM   Specimen: Soft Tissue, Other  Result Value Ref Range Status   Specimen Description TISSUE LEFT ARM  Final   Special Requests FROM LEFT ARM ABSCESS  Final   Gram Stain   Final    ABUNDANT WBC PRESENT, PREDOMINANTLY PMN FEW GRAM POSITIVE COCCI IN PAIRS IN CHAINS Gram Stain Report Called to,Read Back By and Verified With: Donny Pique [phone] @ [phone] FH Performed at [facility], [address] [place], [place] [address]    Culture PENDING  Incomplete   Report Status PENDING  Incomplete   Pertinent Lab seen by me:    Latest Ref Rng & Units 05/27/2023    4:41 AM 05/26/2023    4:03 AM 05/25/2023    2:02 PM  CBC  WBC 4.0 - 10.5 K/uL 21.7  15.4  14.6   Hemoglobin 13.0 - 17.0 g/dL 03.4  74.2  59.5   Hematocrit 39.0 - 52.0 % 36.5  33.1  37.1   Platelets 150 - 400 K/uL 207  335  321       Latest Ref Rng & Units 05/27/2023    4:41 AM 05/26/2023    4:03 AM 05/25/2023    2:02 PM  CMP  Glucose 70 - 99 mg/dL 161  096  045   BUN 8 - 23 mg/dL 26  25  27    Creatinine 0.61 - 1.24 mg/dL 4.09  8.11  9.14   Sodium 135 - 145 mmol/L 132  133  136   Potassium 3.5 - 5.1 mmol/L 3.9  3.4  3.6   Chloride 98 -  111 mmol/L 106  104  101   CO2 22 - 32 mmol/L 19  21  22    Calcium 8.9 - 10.3 mg/dL 8.0  8.0  8.3      Pertinent Imagings/Other Imagings Plain films and CT images have been personally visualized and interpreted; radiology reports have been reviewed. Decision making incorporated into the Impression / Recommendations.  CT HUMERUS LEFT W CONTRAST  Result Date: 05/26/2023 CLINICAL DATA:  Left arm cellulitis follow-up. EXAM: CT OF THE UPPER LEFT EXTREMITY WITH CONTRAST TECHNIQUE: Multidetector CT imaging of the left upper arm, left forearm, and left hand was performed according to the standard protocol following intravenous contrast administration. RADIATION DOSE REDUCTION: This exam was performed according to the departmental dose-optimization program which includes automated exposure control, adjustment of the mA and/or kV according to patient size and/or use of iterative reconstruction technique. CONTRAST:  75mL OMNIPAQUE IOHEXOL 350 MG/ML SOLN COMPARISON:  CT left upper arm and forearm dated May 16, 2023. FINDINGS: Bones/Joint/Cartilage No bony destruction or periosteal reaction. No acute fracture or dislocation. Sequelae of prior remote left acromioclavicular joint injury again noted. 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 Unchanged small elbow joint effusion. Ligaments Ligaments are suboptimally evaluated by CT. Muscles and Tendons Grossly intact. Worsening hypodensity and enlargement of the left triceps muscle with new fluid collection described below. Remaining muscles appear normal. Small lipomas within the left triceps and brachialis muscles again noted. Soft tissue Progressive severe circumferential skin thickening and soft tissue swelling the left arm and hand. New large irregular rim enhancing subcutaneous and intramuscular fluid collection involving the posterior arm and triceps muscle, extending from the mid upper arm to the mid  forearm. This measures up to 3.3 x 6.7 x 29.8 cm (AP by transverse by CC) (series 9, image 63). Additional 1.2 x 1.2 x 5.0 cm rim enhancing fluid collection in the central triceps muscle (series 4, image 291; series 9, image 63). Smaller 4.4 x 1.1 x 5.2 cm rim enhancing fluid collection along the medial aspect of the proximal forearm (series 4, image 215). No subcutaneous emphysema. Possible new skin ulceration of the antecubital fossa (series 4, image 231). Multiple enlarged reactive left axillary lymph nodes again noted. Mild centrilobular emphysema in the visualized lungs. IMPRESSION: 1. Progressive severe cellulitis of the entire left arm and hand with new large subcutaneous and intramuscular abscess involving the posterior arm and triceps muscle, extending from the mid upper arm to the mid forearm. Additional smaller abscesses in the central triceps muscle and medial aspect of the proximal forearm. 2. Increased hypodensity and enlargement of the left triceps muscle, concerning for worsening infectious myositis. 3. Possible new skin ulceration of the antecubital fossa. Correlate with physical exam. 4. No evidence of osteomyelitis. 5.  Emphysema (ICD10-J43.9). Electronically Signed   By: Obie Dredge M.D.   On: 05/26/2023 09:51   CT FOREARM LEFT W CONTRAST  Result Date:  05/26/2023 CLINICAL DATA:  Left arm cellulitis follow-up. EXAM: CT OF THE UPPER LEFT EXTREMITY WITH CONTRAST TECHNIQUE: Multidetector CT imaging of the left upper arm, left forearm, and left hand was performed according to the standard protocol following intravenous contrast administration. RADIATION DOSE REDUCTION: This exam was performed according to the departmental dose-optimization program which includes automated exposure control, adjustment of the mA and/or kV according to patient size and/or use of iterative reconstruction technique. CONTRAST:  75mL OMNIPAQUE IOHEXOL 350 MG/ML SOLN COMPARISON:  CT left upper arm and forearm dated  May 16, 2023. FINDINGS: Bones/Joint/Cartilage No bony destruction or periosteal reaction. No acute fracture or dislocation. Sequelae of prior remote left acromioclavicular joint injury again noted. 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 Unchanged small elbow joint effusion. Ligaments Ligaments are suboptimally evaluated by CT. Muscles and Tendons Grossly intact. Worsening hypodensity and enlargement of the left triceps muscle with new fluid collection described below. Remaining muscles appear normal. Small lipomas within the left triceps and brachialis muscles again noted. Soft tissue Progressive severe circumferential skin thickening and soft tissue swelling the left arm and hand. New large irregular rim enhancing subcutaneous and intramuscular fluid collection involving the posterior arm and triceps muscle, extending from the mid upper arm to the mid forearm. This measures up to 3.3 x 6.7 x 29.8 cm (AP by transverse by CC) (series 9, image 63). Additional 1.2 x 1.2 x 5.0 cm rim enhancing fluid collection in the central triceps muscle (series 4, image 291; series 9, image 63). Smaller 4.4 x 1.1 x 5.2 cm rim enhancing fluid collection along the medial aspect of the proximal forearm (series 4, image 215). No subcutaneous emphysema. Possible new skin ulceration of the antecubital fossa (series 4, image 231). Multiple enlarged reactive left axillary lymph nodes again noted. Mild centrilobular emphysema in the visualized lungs. IMPRESSION: 1. Progressive severe cellulitis of the entire left arm and hand with new large subcutaneous and intramuscular abscess involving the posterior arm and triceps muscle, extending from the mid upper arm to the mid forearm. Additional smaller abscesses in the central triceps muscle and medial aspect of the proximal forearm. 2. Increased hypodensity and enlargement of the left triceps muscle, concerning for worsening infectious  myositis. 3. Possible new skin ulceration of the antecubital fossa. Correlate with physical exam. 4. No evidence of osteomyelitis. 5.  Emphysema (ICD10-J43.9). Electronically Signed   By: Obie Dredge M.D.   On: 05/26/2023 09:51   CT HAND LEFT W CONTRAST  Result Date: 05/26/2023 CLINICAL DATA:  Left arm cellulitis follow-up. EXAM: CT OF THE UPPER LEFT EXTREMITY WITH CONTRAST TECHNIQUE: Multidetector CT imaging of the left upper arm, left forearm, and left hand was performed according to the standard protocol following intravenous contrast administration. RADIATION DOSE REDUCTION: This exam was performed according to the departmental dose-optimization program which includes automated exposure control, adjustment of the mA and/or kV according to patient size and/or use of iterative reconstruction technique. CONTRAST:  75mL OMNIPAQUE IOHEXOL 350 MG/ML SOLN COMPARISON:  CT left upper arm and forearm dated May 16, 2023. FINDINGS: Bones/Joint/Cartilage No bony destruction or periosteal reaction. No acute fracture or dislocation. Sequelae of prior remote left acromioclavicular joint injury again noted. 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 Unchanged small elbow joint effusion. Ligaments Ligaments are suboptimally evaluated by CT. Muscles and Tendons Grossly intact. Worsening hypodensity and enlargement of the left triceps muscle with new fluid collection described below. Remaining muscles appear normal. Small lipomas within the left  triceps and brachialis muscles again noted. Soft tissue Progressive severe circumferential skin thickening and soft tissue swelling the left arm and hand. New large irregular rim enhancing subcutaneous and intramuscular fluid collection involving the posterior arm and triceps muscle, extending from the mid upper arm to the mid forearm. This measures up to 3.3 x 6.7 x 29.8 cm (AP by transverse by CC) (series 9, image 63).  Additional 1.2 x 1.2 x 5.0 cm rim enhancing fluid collection in the central triceps muscle (series 4, image 291; series 9, image 63). Smaller 4.4 x 1.1 x 5.2 cm rim enhancing fluid collection along the medial aspect of the proximal forearm (series 4, image 215). No subcutaneous emphysema. Possible new skin ulceration of the antecubital fossa (series 4, image 231). Multiple enlarged reactive left axillary lymph nodes again noted. Mild centrilobular emphysema in the visualized lungs. IMPRESSION: 1. Progressive severe cellulitis of the entire left arm and hand with new large subcutaneous and intramuscular abscess involving the posterior arm and triceps muscle, extending from the mid upper arm to the mid forearm. Additional smaller abscesses in the central triceps muscle and medial aspect of the proximal forearm. 2. Increased hypodensity and enlargement of the left triceps muscle, concerning for worsening infectious myositis. 3. Possible new skin ulceration of the antecubital fossa. Correlate with physical exam. 4. No evidence of osteomyelitis. 5.  Emphysema (ICD10-J43.9). Electronically Signed   By: Obie Dredge M.D.   On: 05/26/2023 09:51   VAS [place] UPPER EXTREMITY VENOUS DUPLEX  Result Date: 05/25/2023 UPPER VENOUS STUDY  Patient Name:  Justin Snow  Date of Exam:   05/25/2023 Medical Rec #: [id number]           Accession #:    [id number] Date of Birth: [birth year]           Patient Gender: M Patient Age:   36 years Exam Location:  [facility] Procedure:      VAS [place] UPPER EXTREMITY VENOUS DUPLEX Referring Phys: ALEXIS HUGELMEYER --------------------------------------------------------------------------------  Indications: Swelling, and Erythema Other Indications: LUE cellulitis. Limitations: Body habitus and poor ultrasound/tissue interface. Comparison Study: Previous exam on 05/16/2023 was negative for DVT Performing Technologist: Ernestene Mention RVT, RDMS  Examination Guidelines: A complete evaluation  includes B-mode imaging, spectral Doppler, color Doppler, and power Doppler as needed of all accessible portions of each vessel. Bilateral testing is considered an integral part of a complete examination. Limited examinations for reoccurring indications may be performed as noted.  Right Findings: +----------+------------+---------+-----------+----------+-------+ RIGHT     CompressiblePhasicitySpontaneousPropertiesSummary +----------+------------+---------+-----------+----------+-------+ Subclavian    Full       Yes       Yes                      +----------+------------+---------+-----------+----------+-------+  Left Findings: +----------+------------+---------+-----------+----------+--------------------+ LEFT      CompressiblePhasicitySpontaneousProperties      Summary        +----------+------------+---------+-----------+----------+--------------------+ IJV           Full       Yes       Yes                                   +----------+------------+---------+-----------+----------+--------------------+ Subclavian    Full       Yes       Yes                                   +----------+------------+---------+-----------+----------+--------------------+  Axillary      Full       Yes       Yes                                   +----------+------------+---------+-----------+----------+--------------------+ Brachial      Full       Yes       Yes                                   +----------+------------+---------+-----------+----------+--------------------+ Radial                                              not well visualized  +----------+------------+---------+-----------+----------+--------------------+ Ulnar                    Yes       Yes                   patent by                                                              color/doppler     +----------+------------+---------+-----------+----------+--------------------+ Cephalic      Full                                                        +----------+------------+---------+-----------+----------+--------------------+ Basilic       Full       Yes       Yes                                   +----------+------------+---------+-----------+----------+--------------------+  Summary:  Right: No evidence of thrombosis in the subclavian. Subcutaneous edema throughout LUE.  Left: No evidence of deep vein thrombosis in the upper extremity. No evidence of superficial vein thrombosis in the upper extremity.  *See table(s) above for measurements and observations.  Diagnosing physician: Sherald Hess MD Electronically signed by Sherald Hess MD on 05/25/2023 at 1:14:08 PM.    Final    DG Elbow Complete Left  Result Date: 05/24/2023 CLINICAL DATA:  Arm swelling, infection/cellulitis, pain EXAM: LEFT ELBOW - COMPLETE 3+ VIEW COMPARISON:  05/15/2023 FINDINGS: Diffuse extremity soft tissue swelling/edema. Limited exam because of positioning. No acute osseous finding, fracture or definite large effusion. No acute joint abnormality. IMPRESSION: Diffuse soft tissue swelling/edema. No acute osseous finding by plain radiography. Electronically Signed   By: Judie Petit.  Shick M.D.   On: 05/24/2023 17:46   CT HUMERUS LEFT W CONTRAST  Result Date: 05/17/2023 CLINICAL DATA:  Soft tissue infection suspected, upper arm, no prior imaging EXAM: CT OF THE UPPER LEFT EXTREMITY WITH CONTRAST TECHNIQUE: Multidetector CT imaging of the left humerus was performed according to the standard protocol following intravenous contrast administration. RADIATION DOSE REDUCTION: This exam was performed  according to the departmental dose-optimization program which includes automated exposure control, adjustment of the mA and/or kV according to patient size and/or use of iterative reconstruction technique. CONTRAST:  75mL OMNIPAQUE IOHEXOL 350 MG/ML SOLN COMPARISON:  X-ray 05/15/2023 FINDINGS: Bones/Joint/Cartilage Left humerus  intact without fracture or dislocation. Sequela of remote [facility] joint injury with widening and fragmentation at the [facility] joint and coracoclavicular interval. Moderate glenohumeral joint osteoarthritis. No appreciable glenohumeral joint effusion. Small elbow joint effusion. No erosion or periosteal elevation. Ligaments Suboptimally assessed by CT. Muscles and Tendons Incidental note of a 2.8 x 0.8 cm intramuscular lipoma within the mid left triceps muscle. Additional 4.1 x 1.1 cm lipoma within the brachialis muscle of the mid upper arm. Decreased attenuation within the superficial aspect of the mid to distal triceps muscle belly likely representing intramuscular edema/myositis. No organized intramuscular fluid collection. Soft tissues Skin thickening with extensive soft tissue edema and fluid within the subcutaneous soft tissues of the upper arm, most confluent at the posterior aspect of the elbow. No organized or rim enhancing fluid collection is present at this time. No soft tissue gas. Multiple enlarged left axillary lymph nodes, likely reactive. IMPRESSION: 1. Skin thickening with extensive soft tissue edema and fluid within the subcutaneous soft tissues of the upper arm, most confluent at the posterior aspect of the elbow. Findings are compatible with cellulitis. No organized or rim enhancing fluid collection is present at this time. 2. Decreased attenuation within the superficial aspect of the mid to distal triceps muscle belly likely representing intramuscular edema/myositis. 3. Small elbow joint effusion, nonspecific. Septic arthritis not excluded. 4. No CT evidence of osteomyelitis. 5. Multiple enlarged left axillary lymph nodes, likely reactive. Electronically Signed   By: Duanne Guess D.O.   On: 05/17/2023 08:52   CT FOREARM LEFT W CONTRAST  Result Date: 05/17/2023 CLINICAL DATA:  Soft tissue infection suspected, forearm, xray done EXAM: CT OF THE UPPER LEFT EXTREMITY WITH CONTRAST TECHNIQUE:  Multidetector CT imaging of the left forearm was performed according to the standard protocol following intravenous contrast administration. RADIATION DOSE REDUCTION: This exam was performed according to the departmental dose-optimization program which includes automated exposure control, adjustment of the mA and/or kV according to patient size and/or use of iterative reconstruction technique. CONTRAST:  75mL OMNIPAQUE IOHEXOL 350 MG/ML SOLN COMPARISON:  X-ray 05/15/2023 FINDINGS: Bones/Joint/Cartilage Left radius and ulna are intact. No fracture or dislocation. Small elbow joint effusion, nonspecific. 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 Mild degenerative changes within the wrist. No erosion or periosteal elevation. Ligaments Suboptimally assessed by CT. Muscles and Tendons No acute musculotendinous abnormality by CT. No intramuscular fluid collection is identified. Soft tissues Skin thickening with extensive subcutaneous edema and ill-defined fluid throughout the soft tissues of the left forearm. Fluid is most confluent at the dorsal aspect of the elbow and proximal forearm. No well-defined or rim enhancing fluid collection is evident at this time. No soft tissue gas. IMPRESSION: 1. Skin thickening with extensive subcutaneous edema and ill-defined fluid throughout the soft tissues of the left forearm, most compatible with cellulitis. No well-defined or rim enhancing fluid collection is evident at this time. 2. Small elbow joint effusion, nonspecific but likely reactive. Septic arthritis is not entirely excluded. 3. No evidence of osteomyelitis. 4. 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 Electronically Signed   By: Duanne Guess D.O.   On: 05/17/2023 08:42   VAS [place] UPPER EXTREMITY VENOUS DUPLEX  Result Date: 05/16/2023 UPPER VENOUS STUDY  Patient Name:  Justin Snow  Melany Guernsey  Date of Exam:   05/16/2023 Medical Rec #: [id number]           Accession #:    [id number] Date of  Birth: [birth year]           Patient Gender: M Patient Age:   65 years Exam Location:  [facility] Procedure:      VAS [place] UPPER EXTREMITY VENOUS DUPLEX Referring Phys: MICHAEL JEFFERY --------------------------------------------------------------------------------  Indications: cellulitis, Swelling, and Erythema Limitations: Body habitus, poor ultrasound/tissue interface and lack of cooperation. Comparison Study: No previous study. Performing Technologist: McKayla Maag RVT, [place]  Examination Guidelines: A complete evaluation includes B-mode imaging, spectral Doppler, color Doppler, and power Doppler as needed of all accessible portions of each vessel. Bilateral testing is considered an integral part of a complete examination. Limited examinations for reoccurring indications may be performed as noted.  Right Findings: +----------+------------+---------+-----------+----------+-------+ RIGHT     CompressiblePhasicitySpontaneousPropertiesSummary +----------+------------+---------+-----------+----------+-------+ Subclavian               Yes       Yes                      +----------+------------+---------+-----------+----------+-------+  Left Findings: +----------+------------+---------+-----------+----------+--------------+ LEFT      CompressiblePhasicitySpontaneousProperties   Summary     +----------+------------+---------+-----------+----------+--------------+ IJV           Full       Yes       Yes                             +----------+------------+---------+-----------+----------+--------------+ Subclavian    Full       Yes       Yes                             +----------+------------+---------+-----------+----------+--------------+ Axillary      Full       Yes       Yes                             +----------+------------+---------+-----------+----------+--------------+ Brachial      Full       Yes       Yes                              +----------+------------+---------+-----------+----------+--------------+ Radial        Full                                                 +----------+------------+---------+-----------+----------+--------------+ Ulnar                                               Not visualized +----------+------------+---------+-----------+----------+--------------+ Cephalic      Full                                                 +----------+------------+---------+-----------+----------+--------------+ Basilic  Not visualized +----------+------------+---------+-----------+----------+--------------+  Summary:  Right: No evidence of thrombosis in the subclavian.  Left: No evidence of deep vein thrombosis in the upper extremity. No evidence of superficial vein thrombosis in the upper extremity. Limited study due to patient's unwillingness to move arm away from his body.  *See table(s) above for measurements and observations.  Diagnosing physician: Sherald Hess MD Electronically signed by Sherald Hess MD on 05/16/2023 at 6:23:40 PM.    Final    DG Forearm Left  Result Date: 05/15/2023 CLINICAL DATA:  Infection on arm with new onset confusion. Arm swollen from shoulder to fingertips. EXAM: LEFT FOREARM - 2 VIEW; LEFT ELBOW - 2 VIEW COMPARISON:  None Available. FINDINGS: Diffuse soft tissue swelling about the elbow and forearm. No acute fracture or dislocation. No evidence of osteomyelitis. No elbow joint effusion. IMPRESSION: Diffuse soft tissue swelling of the left arm without acute osseous abnormality. Electronically Signed   By: Minerva Fester M.D.   On: 05/15/2023 21:41   DG Elbow 2 Views Left  Result Date: 05/15/2023 CLINICAL DATA:  Infection on arm with new onset confusion. Arm swollen from shoulder to fingertips. EXAM: LEFT FOREARM - 2 VIEW; LEFT ELBOW - 2 VIEW COMPARISON:  None Available. FINDINGS: Diffuse soft tissue swelling about the  elbow and forearm. No acute fracture or dislocation. No evidence of osteomyelitis. No elbow joint effusion. IMPRESSION: Diffuse soft tissue swelling of the left arm without acute osseous abnormality. Electronically Signed   By: Minerva Fester M.D.   On: 05/15/2023 21:41   CT HEAD WO CONTRAST ( )  Result Date: 05/11/2023 CLINICAL DATA:  Mental status change, unknown cause EXAM: CT HEAD WITHOUT CONTRAST TECHNIQUE: Contiguous axial images were obtained from the base of the skull through the vertex without intravenous contrast. RADIATION DOSE REDUCTION: This exam was performed according to the departmental dose-optimization program which includes automated exposure control, adjustment of the mA and/or kV according to patient size and/or use of iterative reconstruction technique. COMPARISON:  CT head 05/02/23 FINDINGS: Brain: No hemorrhage. No hydrocephalus. No extra-axial fluid collection. 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 No CT evidence of acute cortical infarct. No mass effect. No mass lesion. Vascular: No hyperdense vessel or unexpected calcification. Skull: Normal. Negative for fracture or focal lesion. Sinuses/Orbits: No middle ear or mastoid effusion. Paranasal sinuses are clear. Orbits are unremarkable. Other: None. IMPRESSION: No CT etiology for altered mental status identified. Electronically Signed   By: Lorenza Cambridge M.D.   On: 05/11/2023 10:35   DG Chest Portable 1 View  Result Date: 05/11/2023 CLINICAL DATA:  Altered mental status Fever Hypertension Tachycardia EXAM: PORTABLE CHEST - 1 VIEW COMPARISON:  05/06/2023 FINDINGS: Cardiomediastinal silhouette and pulmonary vasculature are within normal limits. Minimal opacities in the right medial lung base favored to be atelectasis rather than pneumonia. Lungs are otherwise clear. Chronic left AC joint separation. IMPRESSION: No acute cardiopulmonary process. Electronically Signed   By: Acquanetta Belling  M.D.   On: 05/11/2023 09:21   DG Chest Portable 1 View  Result Date: 05/06/2023 CLINICAL DATA:  Shortness of breath x1 year EXAM: PORTABLE CHEST 1 VIEW COMPARISON:  05/02/2023 FINDINGS: Lungs are clear.  No pleural effusion or pneumothorax. The heart is normal in size. IMPRESSION: No acute cardiopulmonary disease. Electronically Signed   By: Charline Bills M.D.   On: 05/06/2023 21:02   DG Chest Portable 1 View  Result Date: 05/02/2023 CLINICAL DATA:  Shortness of breath EXAM: PORTABLE CHEST 1 VIEW  COMPARISON:  04/24/2023 FINDINGS: The heart size and mediastinal contours are within normal limits. Coarsened bibasilar interstitial markings. No pleural effusion or pneumothorax. The visualized skeletal structures are unremarkable. IMPRESSION: Coarsened bibasilar interstitial markings, which may reflect bronchitic lung changes. Electronically Signed   By: Duanne Guess D.O.   On: 05/02/2023 12:50   CT Head Wo Contrast  Result Date: 05/02/2023 CLINICAL DATA:  Mental status change, unknown cause EXAM: CT HEAD WITHOUT CONTRAST TECHNIQUE: Contiguous axial images were obtained from the base of the skull through the vertex without intravenous contrast. RADIATION DOSE REDUCTION: This exam was performed according to the departmental dose-optimization program which includes automated exposure control, adjustment of the mA and/or kV according to patient size and/or use of iterative reconstruction technique. COMPARISON:  Brain MRI 04/24/2023 FINDINGS: Brain: No evidence of acute infarction, hemorrhage, hydrocephalus, extra-axial collection or mass lesion/mass effect. Sequela of moderate chronic microvascular ischemic change. Chronic left thalamic infarct. Vascular: No hyperdense vessel or unexpected calcification. Skull: Normal. Negative for fracture or focal lesion. Sinuses/Orbits: No middle ear or mastoid effusion. Paranasal sinuses are clear. Orbits are unremarkable. Other: None. IMPRESSION: No acute intracranial  abnormality. Electronically Signed   By: Lorenza Cambridge M.D.   On: 05/02/2023 12:27    I have personally spent 84 minutes involved in face-to-face and non-face-to-face activities for this patient on the day of the visit. Professional time spent includes the following activities: Preparing to see the patient (review of tests), Obtaining and/or reviewing separately obtained history (admission/discharge record), Performing a medically appropriate examination and/or evaluation , Ordering medications/tests/procedures, referring and communicating with other health care professionals, Documenting clinical information in the EMR, Independently interpreting results (not separately reported), Communicating results to the patient/family/caregiver, Counseling and educating the patient/family/caregiver and Care coordination (not separately reported).  Electronically signed by:   Plan d/w requesting provider as well as ID pharm D  Note: This document was prepared using dragon voice recognition software and may include unintentional dictation errors.   Odette Fraction, MD Infectious Disease Physician [facility] Pager: [phone]

## 2023-05-27 NOTE — Progress Notes (Signed)
Orthopaedic Trauma Service Progress Note  Patient ID: Justin Snow MRN: [id number] DOB/AGE: 1956/01/15 67 y.o.  Subjective:  Somnolent Lying in fetal position on R side but appears comfortable Not all that responsive to questions  Extensive debridement performed of multiloculated abscess from left upper arm and forearm  Wound VAC functioning well with good seal canister was changed overnight  Converted to the vera flow setting this morning by myself.  20 cc of normal saline will instilling wound and well for 8 minutes with a 2-1/2-hour VAC time  Return to the OR on Tuesday for repeat I&D and possible closure versus VAC change   ROS As above  Objective:   VITALS:   Vitals:   05/26/23 2000 05/26/23 2357 05/27/23 0533 05/27/23 0810  BP: 138/61 (!) 136/58 (!) 175/72 135/62  Pulse: (!) 55 60 81 82  Resp: 18 18 18 18   Temp:  97.6 F (36.4 C) (!) 97.5 F (36.4 C) 97.8 F (36.6 C)  TempSrc:  Oral Oral Oral  SpO2: 97% 97% 99% 99%  Weight:      Height:        Estimated body mass index is 34.46 kg/m as calculated from the following:   Height as of this encounter: 5\' 7"  (1.702 m).   Weight as of this encounter: 99.8 kg.   Intake/Output      08/29 0701 08/30 0700 08/30 0701 08/31 0700   P.O. 240    I.V. (mL/kg) 1350 (13.5)    IV Piggyback     Total Intake(mL/kg) 1590 (15.9)    Urine (mL/kg/hr) 1900 (0.8) 450 (1.3)   Drains 500    Blood 200    Total Output 2600 450   Net -1010 -450          LABS  Results for orders placed or performed during the hospital encounter of 05/24/23 (from the past 24 hour(s))  Glucose, capillary     Status: Abnormal   Collection Time: 05/26/23 11:44 AM  Result Value Ref Range   Glucose-Capillary 167 (H) 70 - 99 mg/dL  Hepatitis panel, acute     Status: Abnormal   Collection Time: 05/26/23 12:53 PM  Result Value Ref Range   Hepatitis B Surface Ag NON  REACTIVE NON REACTIVE   HCV Ab Reactive (A) NON REACTIVE   Hep A IgM NON REACTIVE NON REACTIVE   Hep B C IgM NON REACTIVE NON REACTIVE  Sedimentation rate     Status: None   Collection Time: 05/26/23 12:53 PM  Result Value Ref Range   Sed Rate 12 0 - 16 mm/hr  C-reactive protein     Status: Abnormal   Collection Time: 05/26/23 12:53 PM  Result Value Ref Range   CRP 2.7 (H) <1.0 mg/dL  Glucose, capillary     Status: Abnormal   Collection Time: 05/26/23  1:19 PM  Result Value Ref Range   Glucose-Capillary 110 (H) 70 - 99 mg/dL  Aerobic/Anaerobic Culture w Gram Stain (surgical/deep wound)     Status: None (Preliminary result)   Collection Time: 05/26/23  2:24 PM   Specimen: Abscess  Result Value Ref Range   Specimen Description ABSCESS LEFT ARM    Special Requests NONE    Gram Stain      ABUNDANT WBC PRESENT, PREDOMINANTLY PMN NO ORGANISMS SEEN  ceFAZolin (ANCEF) IVPB 2g/100 mL premix        2 g 200 mL/hr over 30 Minutes Intravenous On call to O.R. 05/26/23 1233 05/26/23 1408   05/25/23 2000  vancomycin (VANCOCIN) IVPB 1000 mg/200 mL premix        1,000 mg 200 mL/hr over 60 Minutes Intravenous Every 24 hours 05/25/23 1007     05/25/23 1000  vancomycin (VANCOCIN) IVPB 1000 mg/200 mL premix   Status:  Discontinued        1,000 mg 200 mL/hr over 60 Minutes Intravenous Every 12 hours 05/25/23 0410 05/25/23 1007   05/25/23 0800  ceFEPIme (MAXIPIME) 2 g in sodium chloride 0.9 % 100 mL IVPB        2 g 200 mL/hr over 30 Minutes Intravenous 2 times daily 05/25/23 0408     05/24/23 1900  ceFEPIme (MAXIPIME) 2 g in sodium chloride 0.9 % 100 mL IVPB        2 g 200 mL/hr over 30 Minutes Intravenous  Once 05/24/23 1854 05/24/23 2013   05/24/23 1900  vancomycin (VANCOREADY) IVPB 1750 mg/350 mL        1,750 mg 175 mL/hr over 120 Minutes Intravenous  Once 05/24/23 1859 05/25/23 0007     .  POD/HD#: 1  67 y/o male with large L upper extremity abscess   - L upper extremity abscess s/p I&D and placement of veraflo VAC  NWB LUEx  Sling and splint  Ok to move fingers  Vac converted to veraflo setting   Return to OR Tuesday for repeat I&D and possible closure   Ice and elevate to help with swelling   Fingers above elbow and elbow above heart  - ID:   ID has been consulted   Intra-op cultures/gram stain    Gram positive cocci in pairs in chains  - Dispo:  Continue with inpatient care     Mearl Latin, PA-C [phone] (C) 05/27/2023, 10:21 AM  [facility] [address] [place] [address] [phone] [phone] (F)    After 5pm and on the weekends please log on to Amion, go to orthopaedics and the look under the [facility] Call for the provider(s) on call. You can also call our office at [phone] and then follow the prompts to be connected to the call team.  Patient ID: Justin Snow, male   DOB: 09/21/1956, 67 y.o.   MRN: [id number]  Orthopaedic Trauma Service Progress Note  Patient ID: Justin Snow MRN: [id number] DOB/AGE: 1956/01/15 67 y.o.  Subjective:  Somnolent Lying in fetal position on R side but appears comfortable Not all that responsive to questions  Extensive debridement performed of multiloculated abscess from left upper arm and forearm  Wound VAC functioning well with good seal canister was changed overnight  Converted to the vera flow setting this morning by myself.  20 cc of normal saline will instilling wound and well for 8 minutes with a 2-1/2-hour VAC time  Return to the OR on Tuesday for repeat I&D and possible closure versus VAC change   ROS As above  Objective:   VITALS:   Vitals:   05/26/23 2000 05/26/23 2357 05/27/23 0533 05/27/23 0810  BP: 138/61 (!) 136/58 (!) 175/72 135/62  Pulse: (!) 55 60 81 82  Resp: 18 18 18 18   Temp:  97.6 F (36.4 C) (!) 97.5 F (36.4 C) 97.8 F (36.6 C)  TempSrc:  Oral Oral Oral  SpO2: 97% 97% 99% 99%  Weight:      Height:        Estimated body mass index is 34.46 kg/m as calculated from the following:   Height as of this encounter: 5\' 7"  (1.702 m).   Weight as of this encounter: 99.8 kg.   Intake/Output      08/29 0701 08/30 0700 08/30 0701 08/31 0700   P.O. 240    I.V. (mL/kg) 1350 (13.5)    IV Piggyback     Total Intake(mL/kg) 1590 (15.9)    Urine (mL/kg/hr) 1900 (0.8) 450 (1.3)   Drains 500    Blood 200    Total Output 2600 450   Net -1010 -450          LABS  Results for orders placed or performed during the hospital encounter of 05/24/23 (from the past 24 hour(s))  Glucose, capillary     Status: Abnormal   Collection Time: 05/26/23 11:44 AM  Result Value Ref Range   Glucose-Capillary 167 (H) 70 - 99 mg/dL  Hepatitis panel, acute     Status: Abnormal   Collection Time: 05/26/23 12:53 PM  Result Value Ref Range   Hepatitis B Surface Ag NON  REACTIVE NON REACTIVE   HCV Ab Reactive (A) NON REACTIVE   Hep A IgM NON REACTIVE NON REACTIVE   Hep B C IgM NON REACTIVE NON REACTIVE  Sedimentation rate     Status: None   Collection Time: 05/26/23 12:53 PM  Result Value Ref Range   Sed Rate 12 0 - 16 mm/hr  C-reactive protein     Status: Abnormal   Collection Time: 05/26/23 12:53 PM  Result Value Ref Range   CRP 2.7 (H) <1.0 mg/dL  Glucose, capillary     Status: Abnormal   Collection Time: 05/26/23  1:19 PM  Result Value Ref Range   Glucose-Capillary 110 (H) 70 - 99 mg/dL  Aerobic/Anaerobic Culture w Gram Stain (surgical/deep wound)     Status: None (Preliminary result)   Collection Time: 05/26/23  2:24 PM   Specimen: Abscess  Result Value Ref Range   Specimen Description ABSCESS LEFT ARM    Special Requests NONE    Gram Stain      ABUNDANT WBC PRESENT, PREDOMINANTLY PMN NO ORGANISMS SEEN

## 2023-05-27 NOTE — Plan of Care (Signed)

## 2023-05-27 NOTE — Plan of Care (Signed)

## 2023-05-27 NOTE — TOC Progression Note (Signed)
Transition of Care [facility]) - Progression Note    Patient Details  Name: Justin Snow MRN: [id number] Date of Birth: 1956/05/29  [facility] CM/SW Contact  Lorri Frederick, LCSW Phone Number: 05/27/2023, 8:23 AM  Clinical Narrative:   [facility] updated that pt needs additional procedure, not stable for transfer.           Expected Discharge Plan and Services                                               Social Determinants of Health (SDOH) Interventions SDOH Screenings   Food Insecurity: No Food Insecurity (05/24/2023)  Recent Concern: Food Insecurity - Food Insecurity Present (04/28/2023)  Housing: Patient Declined (05/24/2023)  Transportation Needs: No Transportation Needs (05/24/2023)  Recent Concern: Transportation Needs - Unmet Transportation Needs (04/28/2023)  Utilities: Not At Risk (05/24/2023)  Recent Concern: Utilities - At Risk (04/28/2023)  Tobacco Use: High Risk (05/26/2023)    Readmission Risk Interventions     No data to display

## 2023-05-28 DIAGNOSIS — L03114 Cellulitis of left upper limb: Secondary | ICD-10-CM | POA: Diagnosis not present

## 2023-05-28 LAB — GLUCOSE, CAPILLARY
Glucose-Capillary: 56 mg/dL — ABNORMAL LOW (ref 70–99)
Glucose-Capillary: 115 mg/dL — ABNORMAL HIGH (ref 70–99)
Glucose-Capillary: 146 mg/dL — ABNORMAL HIGH (ref 70–99)
Glucose-Capillary: 150 mg/dL — ABNORMAL HIGH (ref 70–99)
Glucose-Capillary: 161 mg/dL — ABNORMAL HIGH (ref 70–99)
Glucose-Capillary: 137 mg/dL — ABNORMAL HIGH (ref 70–99)

## 2023-05-28 LAB — CBC
HCT: 28.8 % — ABNORMAL LOW (ref 39.0–52.0)
Hemoglobin: 9.7 g/dL — ABNORMAL LOW (ref 13.0–17.0)
MCH: 28.2 pg (ref 26.0–34.0)
MCHC: 33.7 g/dL (ref 30.0–36.0)
MCV: 83.7 fL (ref 80.0–100.0)
Platelets: 263 10*3/uL (ref 150–400)
RBC: 3.44 MIL/uL — ABNORMAL LOW (ref 4.22–5.81)
RDW: 13.7 % (ref 11.5–15.5)
WBC: 17 10*3/uL — ABNORMAL HIGH (ref 4.0–10.5)
nRBC: 0 % (ref 0.0–0.2)

## 2023-05-28 LAB — BASIC METABOLIC PANEL
Anion gap: 7 (ref 5–15)
BUN: 30 mg/dL — ABNORMAL HIGH (ref 8–23)
CO2: 20 mmol/L — ABNORMAL LOW (ref 22–32)
Calcium: 7.8 mg/dL — ABNORMAL LOW (ref 8.9–10.3)
Chloride: 105 mmol/L (ref 98–111)
Creatinine, Ser: 1.53 mg/dL — ABNORMAL HIGH (ref 0.61–1.24)
GFR, Estimated: 49 mL/min — ABNORMAL LOW (ref >60)
Glucose, Bld: 72 mg/dL (ref 70–99)
Potassium: 4 mmol/L (ref 3.5–5.1)
Sodium: 132 mmol/L — ABNORMAL LOW (ref 135–145)

## 2023-05-28 LAB — CK: Total CK: 19 U/L — ABNORMAL LOW (ref 49–397)

## 2023-05-28 LAB — MAGNESIUM: Magnesium: 2.4 mg/dL (ref 1.7–2.4)

## 2023-05-28 MED ORDER — INSULIN GLARGINE-YFGN 100 UNIT/ML ~~LOC~~ SOLN
5.0000 [IU] | Freq: Every day | SUBCUTANEOUS | Status: DC
  Administered 2023-05-29 – 2023-06-02 (×5): 5 [IU] via SUBCUTANEOUS
  Filled 2023-05-28 (×6): qty 0.05

## 2023-05-28 MED ORDER — INSULIN ASPART 100 UNIT/ML IJ SOLN
0.0000 [IU] | Freq: Three times a day (TID) | INTRAMUSCULAR | Status: DC
  Administered 2023-05-28: 2 [IU] via SUBCUTANEOUS
  Administered 2023-05-28 – 2023-05-30 (×3): 1 [IU] via SUBCUTANEOUS
  Administered 2023-05-30 – 2023-05-31 (×2): 2 [IU] via SUBCUTANEOUS
  Administered 2023-05-31: 1 [IU] via SUBCUTANEOUS
  Administered 2023-06-01: 3 [IU] via SUBCUTANEOUS
  Administered 2023-06-01 – 2023-06-02 (×3): 2 [IU] via SUBCUTANEOUS
  Administered 2023-06-02: 1 [IU] via SUBCUTANEOUS

## 2023-05-28 MED ORDER — INSULIN ASPART 100 UNIT/ML IJ SOLN
0.0000 [IU] | Freq: Every day | INTRAMUSCULAR | Status: DC
  Administered 2023-05-31: 2 [IU] via SUBCUTANEOUS
  Administered 2023-06-01: 3 [IU] via SUBCUTANEOUS

## 2023-05-28 NOTE — Progress Notes (Signed)
Progress Note  Patient ID: Justin Snow MRN: [id number] DOB/AGE: 1955/12/29 67 y.o.  Subjective:  Somnolent, easily awoken but unable to answer questions. Incoherent speech and then falls back asleep.  Extensive debridement performed of multiloculated abscess from left upper arm and forearm  Wound VAC functioning well with good seal  Return to the OR on Tuesday with Dr. Carola Frost for repeat I&D and possible closure versus VAC change  ROS As above  Objective:   VITALS:   Vitals:   05/27/23 2115 05/28/23 0444 05/28/23 0746 05/28/23 0749  BP: 121/60 (!) 97/58 (!) 131/55   Pulse: 64 65 65   Resp: 18 19 18    Temp: 98.1 F (36.7 C) 98 F (36.7 C) 97.7 F (36.5 C)   TempSrc: Oral Oral Oral   SpO2: 98% 97% 92% 92%  Weight:      Height:        Estimated body mass index is 34.46 kg/m as calculated from the following:   Height as of this encounter: 5\' 7"  (1.702 m).   Weight as of this encounter: 99.8 kg.   Intake/Output      08/30 0701 08/31 0700 08/31 0701 09/01 0700   P.O. 60    I.V. (mL/kg) 2225.1 (22.3)    IV Piggyback 863    Total Intake(mL/kg) 3148.1 (31.5)    Urine (mL/kg/hr) 450 (0.2)    Drains 740    Blood     Total Output 1190    Net +1958.1           LABS  Results for orders placed or performed during the hospital encounter of 05/24/23 (from the past 24 hour(s))  Glucose, capillary     Status: Abnormal   Collection Time: 05/27/23 11:36 AM  Result Value Ref Range   Glucose-Capillary 130 (H) 70 - 99 mg/dL  Glucose, capillary     Status: None   Collection Time: 05/27/23  3:50 PM  Result Value Ref Range   Glucose-Capillary 82 70 - 99 mg/dL  Glucose, capillary     Status: None   Collection Time: 05/27/23  5:25 PM  Result Value Ref Range   Glucose-Capillary 96 70 - 99 mg/dL  Glucose, capillary     Status: None   Collection Time: 05/27/23  8:06 PM  Result Value Ref Range   Glucose-Capillary 86 70 - 99 mg/dL  Basic metabolic panel     Status:  Abnormal   Collection Time: 05/28/23  3:36 AM  Result Value Ref Range   Sodium 132 (L) 135 - 145 mmol/L   Potassium 4.0 3.5 - 5.1 mmol/L   Chloride 105 98 - 111 mmol/L   CO2 20 (L) 22 - 32 mmol/L   Glucose, Bld 72 70 - 99 mg/dL   BUN 30 (H) 8 - 23 mg/dL   Creatinine, Ser 6.96 (H) 0.61 - 1.24 mg/dL   Calcium 7.8 (L) 8.9 - 10.3 mg/dL   GFR, Estimated 49 (L) >60 mL/min   Anion gap 7 5 - 15  CBC     Status: Abnormal   Collection Time: 05/28/23  3:36 AM  Result Value Ref Range   WBC 17.0 (H) 4.0 - 10.5 K/uL   RBC 3.44 (L) 4.22 - 5.81 MIL/uL   Hemoglobin 9.7 (L) 13.0 - 17.0 g/dL   HCT 29.5 (L) 28.4 - 13.2 %   MCV 83.7 80.0 - 100.0 fL   MCH 28.2 26.0 - 34.0 pg   MCHC 33.7 30.0 - 36.0 g/dL   RDW  13.7 11.5 - 15.5 %   Platelets 263 150 - 400 K/uL   nRBC 0.0 0.0 - 0.2 %  Magnesium     Status: None   Collection Time: 05/28/23  3:36 AM  Result Value Ref Range   Magnesium 2.4 1.7 - 2.4 mg/dL  CK     Status: Abnormal   Collection Time: 05/28/23  3:36 AM  Result Value Ref Range   Total CK 19 (L) 49 - 397 U/L  Glucose, capillary     Status: Abnormal   Collection Time: 05/28/23  7:51 AM  Result Value Ref Range   Glucose-Capillary 56 (L) 70 - 99 mg/dL     PHYSICAL EXAM:   Gen: somnolent, lying on back, does wake up to gentle touch with garbled speech Lungs: unlabored Ext:       Left Upper Extremity   Splint fitting well  Sling is off  Vac with good suction and seal  Ext warm   Mild edema to hand   Not cooperating for motor or sensory exam, though does move all fingers when I wake him   Good perfusion distally   Assessment/Plan: 2 Days Post-Op   Principal Problem:   Left arm cellulitis   Anti-infectives (From admission, onward)    Start     Dose/Rate Route Frequency Ordered Stop   05/27/23 1600  DAPTOmycin (CUBICIN) 650 mg in sodium chloride 0.9 % IVPB        8 mg/kg  79.6 kg (Adjusted) 126 mL/hr over 30 Minutes Intravenous Daily 05/27/23 1512     05/27/23 0600   ceFAZolin (ANCEF) IVPB 2g/100 mL premix        2 g 200 mL/hr over 30 Minutes Intravenous On call to O.R. 05/26/23 1233 05/26/23 1408   05/25/23 2000  vancomycin (VANCOCIN) IVPB 1000 mg/200 mL premix  Status:  Discontinued        1,000 mg 200 mL/hr over 60 Minutes Intravenous Every 24 hours 05/25/23 1007 05/27/23 1512   05/25/23 1000  vancomycin (VANCOCIN) IVPB 1000 mg/200 mL premix  Status:  Discontinued        1,000 mg 200 mL/hr over 60 Minutes Intravenous Every 12 hours 05/25/23 0410 05/25/23 1007   05/25/23 0800  ceFEPIme (MAXIPIME) 2 g in sodium chloride 0.9 % 100 mL IVPB        2 g 200 mL/hr over 30 Minutes Intravenous 2 times daily 05/25/23 0408     05/24/23 1900  ceFEPIme (MAXIPIME) 2 g in sodium chloride 0.9 % 100 mL IVPB        2 g 200 mL/hr over 30 Minutes Intravenous  Once 05/24/23 1854 05/24/23 2013   05/24/23 1900  vancomycin (VANCOREADY) IVPB 1750 mg/350 mL        1,750 mg 175 mL/hr over 120 Minutes Intravenous  Once 05/24/23 1859 05/25/23 0007     .  POD/HD#: 2  67 y/o male with large L upper extremity abscess   - L upper extremity abscess s/p I&D and placement of veraflo VAC  NWB LUEx  Sling and splint  Ok to move fingers  Vac converted to veraflo setting   Return to OR Tuesday for repeat I&D and possible closure   Ice and elevate to help with swelling   Fingers above elbow and elbow above heart  - ID:   ID has been consulted, continue IV antibiotics per ID   Intra-op cultures/gram stain    Gram positive cocci in pairs in chains  - Dispo:  Continue with inpatient care   Janine Ores, PA-C

## 2023-05-28 NOTE — Plan of Care (Signed)
Problem: Education: Goal: Knowledge of General Education information will improve Description: Including pain rating scale, medication(s)/side effects and non-pharmacologic comfort measures 05/28/2023 2155 by Myrtis Ser, LPN Outcome: Progressing 05/28/2023 2155 by Myrtis Ser, LPN Outcome: Progressing   Problem: Health Behavior/Discharge Planning: Goal: Ability to manage health-related needs will improve 05/28/2023 2155 by Myrtis Ser, LPN Outcome: Progressing 05/28/2023 2155 by Myrtis Ser, LPN Outcome: Progressing   Problem: Clinical Measurements: Goal: Ability to maintain clinical measurements within normal limits will improve 05/28/2023 2155 by Myrtis Ser, LPN Outcome: Progressing 05/28/2023 2155 by Myrtis Ser, LPN Outcome: Progressing Goal: Will remain free from infection 05/28/2023 2155 by Myrtis Ser, LPN Outcome: Progressing 05/28/2023 2155 by Myrtis Ser, LPN Outcome: Progressing Goal: Diagnostic test results will improve 05/28/2023 2155 by Myrtis Ser, LPN Outcome: Progressing 05/28/2023 2155 by Myrtis Ser, LPN Outcome: Progressing Goal: Respiratory complications will improve 05/28/2023 2155 by Myrtis Ser, LPN Outcome: Progressing 05/28/2023 2155 by Myrtis Ser, LPN Outcome: Progressing Goal: Cardiovascular complication will be avoided 05/28/2023 2155 by Myrtis Ser, LPN Outcome: Progressing 05/28/2023 2155 by Myrtis Ser, LPN Outcome: Progressing   Problem: Activity: Goal: Risk for activity intolerance will decrease 05/28/2023 2155 by Myrtis Ser, LPN Outcome: Progressing 05/28/2023 2155 by Myrtis Ser, LPN Outcome: Progressing   Problem: Nutrition: Goal: Adequate nutrition will be maintained 05/28/2023 2155 by Myrtis Ser, LPN Outcome: Progressing 05/28/2023 2155 by Myrtis Ser, LPN Outcome: Progressing   Problem: Coping: Goal: Level of anxiety will decrease 05/28/2023 2155 by Myrtis Ser, LPN Outcome: Progressing 05/28/2023 2155 by Myrtis Ser,  LPN Outcome: Progressing   Problem: Elimination: Goal: Will not experience complications related to bowel motility 05/28/2023 2155 by Myrtis Ser, LPN Outcome: Progressing 05/28/2023 2155 by Myrtis Ser, LPN Outcome: Progressing Goal: Will not experience complications related to urinary retention 05/28/2023 2155 by Myrtis Ser, LPN Outcome: Progressing 05/28/2023 2155 by Myrtis Ser, LPN Outcome: Progressing   Problem: Pain Managment: Goal: General experience of comfort will improve 05/28/2023 2155 by Myrtis Ser, LPN Outcome: Progressing 05/28/2023 2155 by Myrtis Ser, LPN Outcome: Progressing   Problem: Safety: Goal: Ability to remain free from injury will improve 05/28/2023 2155 by Myrtis Ser, LPN Outcome: Progressing 05/28/2023 2155 by Myrtis Ser, LPN Outcome: Progressing   Problem: Skin Integrity: Goal: Risk for impaired skin integrity will decrease 05/28/2023 2155 by Myrtis Ser, LPN Outcome: Progressing 05/28/2023 2155 by Myrtis Ser, LPN Outcome: Progressing   Problem: Education: Goal: Ability to describe self-care measures that may prevent or decrease complications (Diabetes Survival Skills Education) will improve 05/28/2023 2155 by Myrtis Ser, LPN Outcome: Progressing 05/28/2023 2155 by Myrtis Ser, LPN Outcome: Progressing Goal: Individualized Educational Video(s) 05/28/2023 2155 by Myrtis Ser, LPN Outcome: Progressing 05/28/2023 2155 by Myrtis Ser, LPN Outcome: Progressing   Problem: Coping: Goal: Ability to adjust to condition or change in health will improve 05/28/2023 2155 by Myrtis Ser, LPN Outcome: Progressing 05/28/2023 2155 by Myrtis Ser, LPN Outcome: Progressing   Problem: Fluid Volume: Goal: Ability to maintain a balanced intake and output will improve 05/28/2023 2155 by Myrtis Ser, LPN Outcome: Progressing 05/28/2023 2155 by Myrtis Ser, LPN Outcome: Progressing   Problem: Health Behavior/Discharge Planning: Goal: Ability to identify and  utilize available resources and services will improve 05/28/2023 2155 by Myrtis Ser, LPN Outcome: Progressing 05/28/2023 2155 by Myrtis Ser, LPN Outcome: Progressing Goal: Ability to manage health-related needs will improve 05/28/2023 2155 by Myrtis Ser, LPN Outcome: Progressing 05/28/2023 2155 by Myrtis Ser, LPN Outcome: Progressing   Problem: Metabolic: Goal: Ability to maintain appropriate glucose levels will improve  05/28/2023 2155 by Myrtis Ser, LPN Outcome: Progressing 05/28/2023 2155 by Myrtis Ser, LPN Outcome: Progressing   Problem: Nutritional: Goal: Maintenance of adequate nutrition will improve 05/28/2023 2155 by Myrtis Ser, LPN Outcome: Progressing 05/28/2023 2155 by Myrtis Ser, LPN Outcome: Progressing Goal: Progress toward achieving an optimal weight will improve 05/28/2023 2155 by Myrtis Ser, LPN Outcome: Progressing 05/28/2023 2155 by Myrtis Ser, LPN Outcome: Progressing   Problem: Skin Integrity: Goal: Risk for impaired skin integrity will decrease 05/28/2023 2155 by Myrtis Ser, LPN Outcome: Progressing 05/28/2023 2155 by Myrtis Ser, LPN Outcome: Progressing   Problem: Tissue Perfusion: Goal: Adequacy of tissue perfusion will improve 05/28/2023 2155 by Myrtis Ser, LPN Outcome: Progressing 05/28/2023 2155 by Myrtis Ser, LPN Outcome: Progressing

## 2023-05-28 NOTE — Progress Notes (Signed)
PROGRESS NOTE    Justin Snow  ZOX:[id number] DOB: Nov 27, 1955 DOA: 05/24/2023 PCP: Pcp, No     Brief Narrative:  67 year old incarcerated male with history of asthma, DM 2, HTN, CKD stage III A, CVA presents to the ED with complaints of arm swelling.  [facility] on August 28 after being treated for cellulitis of the left forearm.  At that time patient completed course of vancomycin and Unasyn and eventually transition to Augmentin and doxycycline.  Returns back to the hospital due to worsening pain and swelling.  X-ray of the elbow shows diffuse edema.  CT performed eventually showed worsening cellulitis with larger abscess therefore orthopedic consulted who plans on the I&D     Assessment & Plan:  Principal Problem:   Left arm cellulitis   Left upper extremity cellulitis with progressing abscess - Failed outpatient antibiotic treatment.  Currently on broad-spectrum.  CT scan of left upper extremity showed severe cellulitis with progressive abscess/inflammation.  Underwent extensive I&D by orthopedic on 8/29 with application of wound VAC.  Anticipate return to the OR on 9/3.  ID following Current Abx: Cefepime and Daptomycin.   On 8/29 at the request of management, I discussed case with patient's Prison system provider Dr Camelia Phenes on [phone]. He advised me to complete his surgery here in the hospital and once patient is medically stable he can be transferred back to prison system on IV or p.o. antibiotic which ever is necessary   History of asthma - Bronchodilators as needed   Diabetes mellitus type 2 - Diabetic diet.  Sliding scale and Accu-Cheks. - Will adjust his insulin due to low blood glucose   CKD stage IIIa -Creatinine around baseline of 1.5   Essential hypertension -Norvasc, Coreg, HCTZ, lisinopril.  IV as needed   History of CVA -MRI was positive for CVA in July 2024.  On statin   Depression -Cymbalta   DVT  prophylaxis: enoxaparin (LOVENOX) injection 40 mg Start: 05/25/23 1000 SCDs Start: 05/24/23 2247 Code Status: Full code Family Communication:   On going surgical and Medical management for his LUE. Will need to go back to the OR on 9/3; earliest dc 9/4                 Subjective: No complaints resting comfortably.   Examination:  General exam: Appears calm and comfortable  Respiratory system: Clear to auscultation. Respiratory effort normal. Cardiovascular system: S1 & S2 heard, RRR. No JVD, murmurs, rubs, gallops or clicks. No pedal edema. Gastrointestinal system: Abdomen is nondistended, soft and nontender. No organomegaly or masses felt. Normal bowel sounds heard. Central nervous system: Alert and oriented. No focal neurological deficits. Extremities: Symmetric 5 x 5 power. Skin: Right upper extremity dressing in place with wound VAC. Psychiatry: Judgement and insight appear normal. Mood & affect appropriate.       Diet Orders (From admission, onward)     Start     Ordered   05/26/23 1603  Diet Carb Modified Fluid consistency: Thin; Room service appropriate? Yes  Diet effective now       Question Answer Comment  Diet-HS Snack? Nothing   Calorie Level Medium 1600-2000   Fluid consistency: Thin   Room service appropriate? Yes      05/26/23 1602            Objective: Vitals:   05/28/23 0444 05/28/23 0746 05/28/23 0749 05/28/23 0915  BP: (!) 97/58 (!) 131/55  (!) 143/79  Pulse:  65 65  62  Resp: 19 18    Temp: 98 F (36.7 C) 97.7 F (36.5 C)  (!) 97.5 F (36.4 C)  TempSrc: Oral Oral  Oral  SpO2: 97% 92% 92% 95%  Weight:      Height:        Intake/Output Summary (Last 24 hours) at 05/28/2023 1102 Last data filed at 05/28/2023 0100 Gross per 24 hour  Intake 3148.05 ml  Output 400 ml  Net 2748.05 ml   Filed Weights   05/24/23 1928 05/26/23 1321  Weight: 99.8 kg 99.8 kg    Scheduled Meds:  amLODipine  10 mg Oral Daily   atorvastatin  10 mg  Oral QHS   carvedilol  6.25 mg Oral BID WC   Chlorhexidine Gluconate Cloth  6 each Topical Q0600   DULoxetine  30 mg Oral BID   enoxaparin (LOVENOX) injection  40 mg Subcutaneous Daily   hydrochlorothiazide  12.5 mg Oral Daily   insulin aspart  0-5 Units Subcutaneous QHS   insulin aspart  0-9 Units Subcutaneous TID WC   insulin glargine-yfgn  5 Units Subcutaneous Daily   lisinopril  40 mg Oral Daily   mometasone-formoterol  2 puff Inhalation BID   pantoprazole  40 mg Oral Daily   sodium chloride flush  3 mL Intravenous Q12H   Continuous Infusions:  sodium chloride 75 mL/hr at 05/27/23 1806   ceFEPime (MAXIPIME) IV 2 g (05/28/23 0855)   DAPTOmycin (CUBICIN) 650 mg in sodium chloride 0.9 % IVPB Stopped (05/27/23 1755)    Nutritional status     Body mass index is 34.46 kg/m.  Data Reviewed:   CBC: Recent Labs  Lab 05/24/23 1705 05/24/23 2349 05/25/23 1402 05/26/23 0403 05/27/23 0441 05/28/23 0336  WBC 20.5* 19.7* 14.6* 15.4* 21.7* 17.0*  NEUTROABS 16.9*  --   --   --   --   --   HGB 11.8* 11.2* 12.5* 11.0* 12.5* 9.7*  HCT 35.5* 33.1* 37.1* 33.1* 36.5* 28.8*  MCV 85.5 84.9 85.5 85.3 84.1 83.7  PLT 388 354 321 335 207 263   Basic Metabolic Panel: Recent Labs  Lab 05/24/23 1705 05/24/23 2349 05/25/23 1402 05/26/23 0403 05/27/23 0441 05/28/23 0336  NA 134*  --  136 133* 132* 132*  K 3.9  --  3.6 3.4* 3.9 4.0  CL 101  --  101 104 106 105  CO2 23  --  22 21* 19* 20*  GLUCOSE 155*  --  154* 140* 142* 72  BUN 26*  --  27* 25* 26* 30*  CREATININE 1.51* 1.52* 1.20 1.45* 1.32* 1.53*  CALCIUM 8.2*  --  8.3* 8.0* 8.0* 7.8*  MG  --   --   --  1.6* 1.5* 2.4  PHOS  --   --   --  3.8  --   --    GFR: Estimated Creatinine Clearance: 51.3 mL/min (A) (by C-G formula based on SCr of 1.53 mg/dL (H)). Liver Function Tests: Recent Labs  Lab 05/24/23 1705  AST 17  ALT 14  ALKPHOS 114  BILITOT 0.8  PROT 5.9*  ALBUMIN 2.1*   No results for input(s): "LIPASE", "AMYLASE"  in the last 168 hours. No results for input(s): "AMMONIA" in the last 168 hours. Coagulation Profile: No results for input(s): "INR", "PROTIME" in the last 168 hours. Cardiac Enzymes: Recent Labs  Lab 05/28/23 0336  CKTOTAL 19*   BNP (last 3 results) No results for input(s): "PROBNP" in the last 8760 hours. HbA1C:  No results for input(s): "HGBA1C" in the last 72 hours. CBG: Recent Labs  Lab 05/27/23 1550 05/27/23 1725 05/27/23 2006 05/28/23 0751 05/28/23 0934  GLUCAP 82 96 86 56* 115*   Lipid Profile: No results for input(s): "CHOL", "HDL", "LDLCALC", "TRIG", "CHOLHDL", "LDLDIRECT" in the last 72 hours. Thyroid Function Tests: No results for input(s): "TSH", "T4TOTAL", "FREET4", "T3FREE", "THYROIDAB" in the last 72 hours. Anemia Panel: No results for input(s): "VITAMINB12", "FOLATE", "FERRITIN", "TIBC", "IRON", "RETICCTPCT" in the last 72 hours. Sepsis Labs: Recent Labs  Lab 05/24/23 1712 05/24/23 1910  LATICACIDVEN 1.0 1.7    Recent Results (from the past 240 hour(s))  Blood culture (routine x 2)     Status: None (Preliminary result)   Collection Time: 05/24/23  5:15 PM   Specimen: BLOOD  Result Value Ref Range Status   Specimen Description BLOOD LEFT ANTECUBITAL  Final   Special Requests   Final    BOTTLES DRAWN AEROBIC ONLY Blood Culture adequate volume   Culture   Final    NO GROWTH 4 DAYS Performed at [facility], [address], [place], [place] [address]    Report Status PENDING  Incomplete  Blood culture (routine x 2)     Status: None (Preliminary result)   Collection Time: 05/24/23  6:57 PM   Specimen: BLOOD RIGHT HAND  Result Value Ref Range Status   Specimen Description BLOOD RIGHT HAND  Final   Special Requests   Final    BOTTLES DRAWN AEROBIC ONLY Blood Culture results may not be optimal due to an inadequate volume of blood received in culture bottles   Culture   Final    NO GROWTH 4 DAYS Performed at [facility], [address], [place], [place] [address]    Report Status PENDING  Incomplete  Aerobic/Anaerobic Culture w Gram Stain (surgical/deep wound)     Status: None (Preliminary result)   Collection Time: 05/26/23  2:24 PM   Specimen: Abscess  Result Value Ref Range Status   Specimen Description ABSCESS LEFT ARM  Final   Special Requests NONE  Final   Gram Stain   Final    ABUNDANT WBC PRESENT, PREDOMINANTLY PMN NO ORGANISMS SEEN    Culture   Final    NO GROWTH < 24 HOURS Performed at [facility], [address], [place], [place] [address]    Report Status PENDING  Incomplete  Aerobic/Anaerobic Culture w Gram Stain (surgical/deep wound)     Status: None (Preliminary result)   Collection Time: 05/26/23  3:14 PM   Specimen: Soft Tissue, Other  Result Value Ref Range Status   Specimen Description TISSUE LEFT ARM  Final   Special Requests FROM LEFT ARM ABSCESS  Final   Gram Stain   Final    ABUNDANT WBC PRESENT, PREDOMINANTLY PMN FEW GRAM POSITIVE COCCI IN PAIRS IN CHAINS Gram Stain Report Called to,Read Back By and Verified With: RN Leslye Peer [phone] @ [phone] FH    Culture   Final    NO GROWTH < 24 HOURS Performed at [facility], [address] [place], [place] [address]    Report Status PENDING  Incomplete         Radiology Studies: No results found.         LOS: 3 days   Time spent= 35 mins    Miguel Rota, MD [facility]  If 7PM-7AM, please contact night-coverage  05/28/2023, 11:02 AM

## 2023-05-28 NOTE — Plan of Care (Signed)

## 2023-05-28 NOTE — Plan of Care (Signed)
Problem: Education: Goal: Knowledge of General Education information will improve Description: Including pain rating scale, medication(s)/side effects and non-pharmacologic comfort measures 05/28/2023 1848 by Olga Millers, LPN Outcome: Progressing 05/28/2023 1847 by Olga Millers, LPN Outcome: Progressing   Problem: Health Behavior/Discharge Planning: Goal: Ability to manage health-related needs will improve 05/28/2023 1848 by Olga Millers, LPN Outcome: Progressing 05/28/2023 1847 by Olga Millers, LPN Outcome: Progressing   Problem: Clinical Measurements: Goal: Ability to maintain clinical measurements within normal limits will improve 05/28/2023 1848 by Olga Millers, LPN Outcome: Progressing 05/28/2023 1847 by Olga Millers, LPN Outcome: Progressing Goal: Will remain free from infection 05/28/2023 1848 by Olga Millers, LPN Outcome: Progressing 05/28/2023 1847 by Olga Millers, LPN Outcome: Progressing Goal: Diagnostic test results will improve 05/28/2023 1848 by Olga Millers, LPN Outcome: Progressing 05/28/2023 1847 by Olga Millers, LPN Outcome: Progressing Goal: Respiratory complications will improve 05/28/2023 1848 by Olga Millers, LPN Outcome: Progressing 05/28/2023 1847 by Olga Millers, LPN Outcome: Progressing Goal: Cardiovascular complication will be avoided 05/28/2023 1848 by Olga Millers, LPN Outcome: Progressing 05/28/2023 1847 by Olga Millers, LPN Outcome: Progressing   Problem: Activity: Goal: Risk for activity intolerance will decrease 05/28/2023 1848 by Olga Millers, LPN Outcome: Progressing 05/28/2023 1847 by Olga Millers, LPN Outcome: Progressing   Problem: Nutrition: Goal: Adequate nutrition will be maintained 05/28/2023 1848 by Olga Millers, LPN Outcome: Progressing 05/28/2023 1847 by Olga Millers, LPN Outcome: Progressing   Problem: Coping: Goal: Level of anxiety will decrease 05/28/2023 1848 by Olga Millers, LPN Outcome:  Progressing 05/28/2023 1847 by Olga Millers, LPN Outcome: Progressing   Problem: Elimination: Goal: Will not experience complications related to bowel motility 05/28/2023 1848 by Olga Millers, LPN Outcome: Progressing 05/28/2023 1847 by Olga Millers, LPN Outcome: Progressing Goal: Will not experience complications related to urinary retention 05/28/2023 1848 by Olga Millers, LPN Outcome: Progressing 05/28/2023 1847 by Olga Millers, LPN Outcome: Progressing   Problem: Pain Managment: Goal: General experience of comfort will improve 05/28/2023 1848 by Olga Millers, LPN Outcome: Progressing 05/28/2023 1847 by Olga Millers, LPN Outcome: Progressing   Problem: Safety: Goal: Ability to remain free from injury will improve 05/28/2023 1848 by Olga Millers, LPN Outcome: Progressing 05/28/2023 1847 by Olga Millers, LPN Outcome: Progressing   Problem: Skin Integrity: Goal: Risk for impaired skin integrity will decrease 05/28/2023 1848 by Olga Millers, LPN Outcome: Progressing 05/28/2023 1847 by Olga Millers, LPN Outcome: Progressing   Problem: Education: Goal: Ability to describe self-care measures that may prevent or decrease complications (Diabetes Survival Skills Education) will improve 05/28/2023 1848 by Olga Millers, LPN Outcome: Progressing 05/28/2023 1847 by Olga Millers, LPN Outcome: Progressing Goal: Individualized Educational Video(s) 05/28/2023 1848 by Olga Millers, LPN Outcome: Progressing 05/28/2023 1847 by Olga Millers, LPN Outcome: Progressing   Problem: Coping: Goal: Ability to adjust to condition or change in health will improve 05/28/2023 1848 by Olga Millers, LPN Outcome: Progressing 05/28/2023 1847 by Olga Millers, LPN Outcome: Progressing   Problem: Fluid Volume: Goal: Ability to maintain a balanced intake and output will improve 05/28/2023 1848 by Olga Millers, LPN Outcome: Progressing 05/28/2023 1847 by Olga Millers, LPN Outcome: Progressing    Problem: Health Behavior/Discharge Planning: Goal: Ability to identify and utilize available resources and services will improve 05/28/2023 1848 by Olga Millers, LPN Outcome: Progressing 05/28/2023 1847 by Olga Millers,  LPN Outcome: Progressing Goal: Ability to manage health-related needs will improve 05/28/2023 1848 by Olga Millers, LPN Outcome: Progressing 05/28/2023 1847 by Olga Millers, LPN Outcome: Progressing   Problem: Metabolic: Goal: Ability to maintain appropriate glucose levels will improve 05/28/2023 1848 by Olga Millers, LPN Outcome: Progressing 05/28/2023 1847 by Olga Millers, LPN Outcome: Progressing   Problem: Nutritional: Goal: Maintenance of adequate nutrition will improve 05/28/2023 1848 by Olga Millers, LPN Outcome: Progressing 05/28/2023 1847 by Olga Millers, LPN Outcome: Progressing Goal: Progress toward achieving an optimal weight will improve 05/28/2023 1848 by Olga Millers, LPN Outcome: Progressing 05/28/2023 1847 by Olga Millers, LPN Outcome: Progressing   Problem: Skin Integrity: Goal: Risk for impaired skin integrity will decrease 05/28/2023 1848 by Olga Millers, LPN Outcome: Progressing 05/28/2023 1847 by Olga Millers, LPN Outcome: Progressing   Problem: Tissue Perfusion: Goal: Adequacy of tissue perfusion will improve 05/28/2023 1848 by Olga Millers, LPN Outcome: Progressing 05/28/2023 1847 by Olga Millers, LPN Outcome: Progressing

## 2023-05-29 DIAGNOSIS — L03114 Cellulitis of left upper limb: Secondary | ICD-10-CM | POA: Diagnosis not present

## 2023-05-29 LAB — GLUCOSE, CAPILLARY
Glucose-Capillary: 139 mg/dL — ABNORMAL HIGH (ref 70–99)
Glucose-Capillary: 138 mg/dL — ABNORMAL HIGH (ref 70–99)
Glucose-Capillary: 140 mg/dL — ABNORMAL HIGH (ref 70–99)
Glucose-Capillary: 119 mg/dL — ABNORMAL HIGH (ref 70–99)

## 2023-05-29 LAB — BASIC METABOLIC PANEL
Anion gap: 7 (ref 5–15)
BUN: 34 mg/dL — ABNORMAL HIGH (ref 8–23)
CO2: 23 mmol/L (ref 22–32)
Calcium: 7.7 mg/dL — ABNORMAL LOW (ref 8.9–10.3)
Chloride: 100 mmol/L (ref 98–111)
Creatinine, Ser: 1.38 mg/dL — ABNORMAL HIGH (ref 0.61–1.24)
GFR, Estimated: 55 mL/min — ABNORMAL LOW (ref >60)
Glucose, Bld: 139 mg/dL — ABNORMAL HIGH (ref 70–99)
Potassium: 4.1 mmol/L (ref 3.5–5.1)
Sodium: 130 mmol/L — ABNORMAL LOW (ref 135–145)

## 2023-05-29 LAB — CULTURE, BLOOD (ROUTINE X 2)
Culture: NO GROWTH
Culture: NO GROWTH
Special Requests: ADEQUATE

## 2023-05-29 LAB — CBC
HCT: 28.7 % — ABNORMAL LOW (ref 39.0–52.0)
Hemoglobin: 9.6 g/dL — ABNORMAL LOW (ref 13.0–17.0)
MCH: 28 pg (ref 26.0–34.0)
MCHC: 33.4 g/dL (ref 30.0–36.0)
MCV: 83.7 fL (ref 80.0–100.0)
Platelets: 237 10*3/uL (ref 150–400)
RBC: 3.43 MIL/uL — ABNORMAL LOW (ref 4.22–5.81)
RDW: 13.6 % (ref 11.5–15.5)
WBC: 13 10*3/uL — ABNORMAL HIGH (ref 4.0–10.5)
nRBC: 0 % (ref 0.0–0.2)

## 2023-05-29 LAB — MAGNESIUM: Magnesium: 2.1 mg/dL (ref 1.7–2.4)

## 2023-05-29 LAB — HCV RNA QUANT: HCV Quantitative: NOT DETECTED [IU]/mL (ref >50)

## 2023-05-29 LAB — ACID FAST SMEAR (AFB, MYCOBACTERIA): Acid Fast Smear: NEGATIVE

## 2023-05-29 MED ORDER — HALOPERIDOL LACTATE 5 MG/ML IJ SOLN
1.0000 mg | Freq: Once | INTRAMUSCULAR | Status: AC | PRN
  Administered 2023-05-31: 1 mg via INTRAVENOUS
  Filled 2023-05-29: qty 1

## 2023-05-29 NOTE — Progress Notes (Signed)
PROGRESS NOTE    Justin Snow  YNW:[id number] DOB: 06-01-1956 DOA: 05/24/2023 PCP: Pcp, No     Brief Narrative:  67 year old incarcerated male with history of asthma, DM 2, HTN, CKD stage III A, CVA presents to the ED with complaints of arm swelling.  [facility] after being treated for cellulitis of the left forearm.  At that time patient completed course of vancomycin and Unasyn and eventually transition to Augmentin and doxycycline.  Returns back to the hospital due to worsening pain and swelling.  X-ray of the elbow shows diffuse edema.  CT performed eventually showed worsening cellulitis with larger abscess therefore orthopedic consulted who plans on the I&D. Underwent extensive I&D by orthopedic on 8/29 with application of wound VAC.  Anticipate return to the OR on 9/3.  ID following     Assessment & Plan:  Principal Problem:   Left arm cellulitis   Left upper extremity cellulitis with progressing abscess - Failed outpatient antibiotic treatment.  Currently on broad-spectrum.  CT scan of left upper extremity showed severe cellulitis with progressive abscess/inflammation.  Underwent extensive I&D by orthopedic on 8/29 with application of wound VAC.  Anticipate return to the OR on 9/3.  ID following Current Abx: Cefepime and Daptomycin.   On 8/29 at the request of management, I discussed case with patient's Prison system provider Dr Camelia Phenes on [phone]. He advised me to complete his surgery here in the hospital and once patient is medically stable he can be transferred back to prison system on IV or p.o. antibiotic which ever is necessary   History of asthma - Bronchodilators as needed   Diabetes mellitus type 2 - Diabetic diet.  Sliding scale and Accu-Cheks. - Will adjust his insulin due to low blood glucose   CKD stage IIIa -Creatinine around baseline of 1.5   Essential hypertension -Norvasc, Coreg, HCTZ, lisinopril.   IV as needed   History of CVA -MRI was positive for CVA in July 2024.  On statin   Depression -Cymbalta   DVT prophylaxis: enoxaparin (LOVENOX) injection 40 mg Start: 05/25/23 1000 SCDs Start: 05/24/23 2247 Code Status: Full code Family Communication:   On going surgical and Medical management for his LUE. Will need to go back to the OR on 9/3; earliest dc 9/4                 Subjective: Resting comfortably.  No complaints at this time   Examination:  General exam: Appears calm and comfortable  Respiratory system: Clear to auscultation. Respiratory effort normal. Cardiovascular system: S1 & S2 heard, RRR. No JVD, murmurs, rubs, gallops or clicks. No pedal edema. Gastrointestinal system: Abdomen is nondistended, soft and nontender. No organomegaly or masses felt. Normal bowel sounds heard. Central nervous system: Alert and oriented. No focal neurological deficits. Extremities: Symmetric 5 x 5 power. Skin: Left upper extremity dressing in place.  Swelling has improved. Psychiatry: Judgement and insight appear normal. Mood & affect appropriate.       Diet Orders (From admission, onward)     Start     Ordered   05/26/23 1603  Diet Carb Modified Fluid consistency: Thin; Room service appropriate? Yes  Diet effective now       Question Answer Comment  Diet-HS Snack? Nothing   Calorie Level Medium 1600-2000   Fluid consistency: Thin   Room service appropriate? Yes      05/26/23 1602  Objective: Vitals:   05/28/23 1320 05/28/23 2037 05/29/23 0531 05/29/23 0735  BP: 127/61 (!) 116/91 (!) 141/59 (!) 125/58  Pulse: (!) 58 79 60 74  Resp: 18 16  18   Temp: 98.3 F (36.8 C) 97.7 F (36.5 C) 97.9 F (36.6 C) 97.8 F (36.6 C)  TempSrc: Oral Oral Oral Oral  SpO2: 96% 99% 100% 99%  Weight:      Height:        Intake/Output Summary (Last 24 hours) at 05/29/2023 0903 Last data filed at 05/28/2023 2101 Gross per 24 hour  Intake 13 ml  Output 450 ml   Net -437 ml   Filed Weights   05/24/23 1928 05/26/23 1321  Weight: 99.8 kg 99.8 kg    Scheduled Meds:  amLODipine  10 mg Oral Daily   atorvastatin  10 mg Oral QHS   carvedilol  6.25 mg Oral BID WC   Chlorhexidine Gluconate Cloth  6 each Topical Q0600   DULoxetine  30 mg Oral BID   enoxaparin (LOVENOX) injection  40 mg Subcutaneous Daily   hydrochlorothiazide  12.5 mg Oral Daily   insulin aspart  0-5 Units Subcutaneous QHS   insulin aspart  0-9 Units Subcutaneous TID WC   insulin glargine-yfgn  5 Units Subcutaneous Daily   lisinopril  40 mg Oral Daily   mometasone-formoterol  2 puff Inhalation BID   pantoprazole  40 mg Oral Daily   sodium chloride flush  3 mL Intravenous Q12H   Continuous Infusions:  sodium chloride 75 mL/hr at 05/28/23 2023   ceFEPime (MAXIPIME) IV 2 g (05/28/23 2025)   DAPTOmycin (CUBICIN) 650 mg in sodium chloride 0.9 % IVPB 650 mg (05/28/23 1525)    Nutritional status     Body mass index is 34.46 kg/m.  Data Reviewed:   CBC: Recent Labs  Lab 05/24/23 1705 05/24/23 2349 05/25/23 1402 05/26/23 0403 05/27/23 0441 05/28/23 0336 05/29/23 0331  WBC 20.5*   < > 14.6* 15.4* 21.7* 17.0* 13.0*  NEUTROABS 16.9*  --   --   --   --   --   --   HGB 11.8*   < > 12.5* 11.0* 12.5* 9.7* 9.6*  HCT 35.5*   < > 37.1* 33.1* 36.5* 28.8* 28.7*  MCV 85.5   < > 85.5 85.3 84.1 83.7 83.7  PLT 388   < > 321 335 207 263 237   < > = values in this interval not displayed.   Basic Metabolic Panel: Recent Labs  Lab 05/25/23 1402 05/26/23 0403 05/27/23 0441 05/28/23 0336 05/29/23 0331  NA 136 133* 132* 132* 130*  K 3.6 3.4* 3.9 4.0 4.1  CL 101 104 106 105 100  CO2 22 21* 19* 20* 23  GLUCOSE 154* 140* 142* 72 139*  BUN 27* 25* 26* 30* 34*  CREATININE 1.20 1.45* 1.32* 1.53* 1.38*  CALCIUM 8.3* 8.0* 8.0* 7.8* 7.7*  MG  --  1.6* 1.5* 2.4 2.1  PHOS  --  3.8  --   --   --    GFR: Estimated Creatinine Clearance: 56.9 mL/min (A) (by C-G formula based on SCr of  1.38 mg/dL (H)). Liver Function Tests: Recent Labs  Lab 05/24/23 1705  AST 17  ALT 14  ALKPHOS 114  BILITOT 0.8  PROT 5.9*  ALBUMIN 2.1*   No results for input(s): "LIPASE", "AMYLASE" in the last 168 hours. No results for input(s): "AMMONIA" in the last 168 hours. Coagulation Profile: No results for input(s): "INR", "PROTIME" in the  last 168 hours. Cardiac Enzymes: Recent Labs  Lab 05/28/23 0336  CKTOTAL 19*   BNP (last 3 results) No results for input(s): "PROBNP" in the last 8760 hours. HbA1C: No results for input(s): "HGBA1C" in the last 72 hours. CBG: Recent Labs  Lab 05/28/23 1210 05/28/23 1538 05/28/23 1752 05/28/23 2039 05/29/23 0732  GLUCAP 146* 150* 161* 137* 139*   Lipid Profile: No results for input(s): "CHOL", "HDL", "LDLCALC", "TRIG", "CHOLHDL", "LDLDIRECT" in the last 72 hours. Thyroid Function Tests: No results for input(s): "TSH", "T4TOTAL", "FREET4", "T3FREE", "THYROIDAB" in the last 72 hours. Anemia Panel: No results for input(s): "VITAMINB12", "FOLATE", "FERRITIN", "TIBC", "IRON", "RETICCTPCT" in the last 72 hours. Sepsis Labs: Recent Labs  Lab 05/24/23 1712 05/24/23 1910  LATICACIDVEN 1.0 1.7    Recent Results (from the past 240 hour(s))  Blood culture (routine x 2)     Status: None   Collection Time: 05/24/23  5:15 PM   Specimen: BLOOD  Result Value Ref Range Status   Specimen Description BLOOD LEFT ANTECUBITAL  Final   Special Requests   Final    BOTTLES DRAWN AEROBIC ONLY Blood Culture adequate volume   Culture   Final    NO GROWTH 5 DAYS Performed at [facility], [address] [place], [place] [address]    Report Status 05/29/2023 FINAL  Final  Blood culture (routine x 2)     Status: None   Collection Time: 05/24/23  6:57 PM   Specimen: BLOOD RIGHT HAND  Result Value Ref Range Status   Specimen Description BLOOD RIGHT HAND  Final   Special Requests   Final    BOTTLES DRAWN AEROBIC ONLY Blood Culture results may not  be optimal due to an inadequate volume of blood received in culture bottles   Culture   Final    NO GROWTH 5 DAYS Performed at [facility], [address] [place], [place] [address]    Report Status 05/29/2023 FINAL  Final  Aerobic/Anaerobic Culture w Gram Stain (surgical/deep wound)     Status: None (Preliminary result)   Collection Time: 05/26/23  2:24 PM   Specimen: Abscess  Result Value Ref Range Status   Specimen Description ABSCESS LEFT ARM  Final   Special Requests NONE  Final   Gram Stain   Final    ABUNDANT WBC PRESENT, PREDOMINANTLY PMN NO ORGANISMS SEEN    Culture   Final    NO GROWTH 2 DAYS NO ANAEROBES ISOLATED; CULTURE IN PROGRESS FOR 5 DAYS Performed at [facility], [address] [place], [place] [address]    Report Status PENDING  Incomplete  Aerobic/Anaerobic Culture w Gram Stain (surgical/deep wound)     Status: None (Preliminary result)   Collection Time: 05/26/23  3:14 PM   Specimen: Soft Tissue, Other  Result Value Ref Range Status   Specimen Description TISSUE LEFT ARM  Final   Special Requests FROM LEFT ARM ABSCESS  Final   Gram Stain   Final    ABUNDANT WBC PRESENT, PREDOMINANTLY PMN FEW GRAM POSITIVE COCCI IN PAIRS IN CHAINS Gram Stain Report Called to,Read Back By and Verified With: RN Leslye Peer [phone] @ [phone] FH    Culture   Final    NO GROWTH 2 DAYS NO ANAEROBES ISOLATED; CULTURE IN PROGRESS FOR 5 DAYS Performed at [facility], [address], [place], [place] [address]    Report Status PENDING  Incomplete  Radiology Studies: No results found.         LOS: 4 days   Time spent= 35 mins    Miguel Rota, MD [facility]  If 7PM-7AM, please contact night-coverage  05/29/2023, 9:03 AM

## 2023-05-29 NOTE — Progress Notes (Signed)
3 Days Post-Op Procedure(s) (LRB): IRRIGATION AND DEBRIDEMENT LEFT UPPER EXTREMITY (Left)  Subjective:  Patient somnolent.  Easily woken but difficulty answering questions.  Incoherent speech, returns to sleep.  Had extensive debridement performed of multiloculated abscess from left upper arm and forearm.  No issues with woundvac.  Bandages appear clean.  Plan to return to OR this Tuesday with Dr. Carola Frost for repeat I&D and possible closure vs VAC change.    Objective:   VITALS:   Vitals:   05/28/23 1320 05/28/23 2037 05/29/23 0531 05/29/23 0735  BP: 127/61 (!) 116/91 (!) 141/59 (!) 125/58  Pulse: (!) 58 79 60 74  Resp: 18 16  18   Temp: 98.3 F (36.8 C) 97.7 F (36.5 C) 97.9 F (36.6 C) 97.8 F (36.6 C)  TempSrc: Oral Oral Oral Oral  SpO2: 96% 99% 100% 99%  Weight:      Height:        Gen: somnolent, lying on back, does wake up to gentle touch with garbled speech Lungs: unlabored Ext:       Left Upper Extremity              Splint fitting well             Sling is off             Vac with good suction and seal             Ext warm              Mild edema to hand              Not cooperating for motor or sensory exam, though does move all fingers when I wake him              Good perfusion distally    Lab Results  Component Value Date   WBC 13.0 (H) 05/29/2023   HGB 9.6 (L) 05/29/2023   HCT 28.7 (L) 05/29/2023   MCV 83.7 05/29/2023   PLT 237 05/29/2023   BMET    Component Value Date/Time   NA 130 (L) 05/29/2023 0331   K 4.1 05/29/2023 0331   CL 100 05/29/2023 0331   CO2 23 05/29/2023 0331   GLUCOSE 139 (H) 05/29/2023 0331   BUN 34 (H) 05/29/2023 0331   CREATININE 1.38 (H) 05/29/2023 0331   CALCIUM 7.7 (L) 05/29/2023 0331   GFRNONAA 55 (L) 05/29/2023 0331    Assessment/Plan: 3 Days Post-Op   Principal Problem:   Left arm cellulitis  67 y/o male with large L upper extremity abscess    - L upper extremity abscess s/p I&D and placement of veraflo  VAC             NWB LUEx             Sling and splint             Ok to move fingers  Hgb stable             Vac converted to veraflo setting              Return to OR Tuesday for repeat I&D and possible closure              Ice and elevate to help with swelling                         Fingers above elbow and elbow above heart   -  ID:              ID has been consulted, continue IV antibiotics per ID              Intra-op cultures/gram stain                          Gram positive cocci in pairs in chains   - Dispo:             Continue with inpatient care    Cecil Cobbs 05/29/2023, 7:36 AM   Weber Cooks, MD  Contact information:   [phone] 7am-5pm epic message Dr. Blanchie Dessert, or call office for patient follow up: [phone] After hours and holidays please check [email or web address] for group call information for [facility]

## 2023-05-30 DIAGNOSIS — L03114 Cellulitis of left upper limb: Secondary | ICD-10-CM | POA: Diagnosis not present

## 2023-05-30 LAB — GLUCOSE, CAPILLARY
Glucose-Capillary: 121 mg/dL — ABNORMAL HIGH (ref 70–99)
Glucose-Capillary: 120 mg/dL — ABNORMAL HIGH (ref 70–99)
Glucose-Capillary: 125 mg/dL — ABNORMAL HIGH (ref 70–99)
Glucose-Capillary: 168 mg/dL — ABNORMAL HIGH (ref 70–99)
Glucose-Capillary: 147 mg/dL — ABNORMAL HIGH (ref 70–99)

## 2023-05-30 LAB — BASIC METABOLIC PANEL
Anion gap: 8 (ref 5–15)
BUN: 34 mg/dL — ABNORMAL HIGH (ref 8–23)
CO2: 22 mmol/L (ref 22–32)
Calcium: 8.3 mg/dL — ABNORMAL LOW (ref 8.9–10.3)
Chloride: 102 mmol/L (ref 98–111)
Creatinine, Ser: 1.41 mg/dL — ABNORMAL HIGH (ref 0.61–1.24)
GFR, Estimated: 54 mL/min — ABNORMAL LOW (ref >60)
Glucose, Bld: 118 mg/dL — ABNORMAL HIGH (ref 70–99)
Potassium: 4 mmol/L (ref 3.5–5.1)
Sodium: 132 mmol/L — ABNORMAL LOW (ref 135–145)

## 2023-05-30 LAB — CBC
HCT: 31.5 % — ABNORMAL LOW (ref 39.0–52.0)
Hemoglobin: 10.5 g/dL — ABNORMAL LOW (ref 13.0–17.0)
MCH: 28.5 pg (ref 26.0–34.0)
MCHC: 33.3 g/dL (ref 30.0–36.0)
MCV: 85.6 fL (ref 80.0–100.0)
Platelets: 255 10*3/uL (ref 150–400)
RBC: 3.68 MIL/uL — ABNORMAL LOW (ref 4.22–5.81)
RDW: 13.7 % (ref 11.5–15.5)
WBC: 10.9 10*3/uL — ABNORMAL HIGH (ref 4.0–10.5)
nRBC: 0 % (ref 0.0–0.2)

## 2023-05-30 LAB — MAGNESIUM: Magnesium: 1.9 mg/dL (ref 1.7–2.4)

## 2023-05-30 NOTE — Anesthesia Preprocedure Evaluation (Signed)
Anesthesia Evaluation  Patient identified by MRN, date of birth, ID band Patient confused    Reviewed: Allergy & Precautions, NPO status , Unable to perform ROS - Chart review only  Airway Mallampati: III  TM Distance: >3 FB Neck ROM: Full   Comment: Previous grade I view with glidescope 4, easy mask Dental  (+) Edentulous Upper, Edentulous Lower, Dental Advisory Given   Pulmonary asthma , Current Smoker and Patient abstained from smoking.   Pulmonary exam normal breath sounds clear to auscultation       Cardiovascular hypertension (amlodipine, carvedilol, lisinopril), Pt. on medications + Valvular Problems/Murmurs (mild) AS  Rhythm:Regular Rate:Normal  HLD  TTE 04/24/2023: IMPRESSIONS     1. Poor echo window. Left ventricular ejection fraction, by estimation,  is 60 to 65%. The left ventricle has normal function. The left ventricle  has no regional wall motion abnormalities. Left ventricular diastolic  parameters are consistent with Grade  II diastolic dysfunction (pseudonormalization). Elevated left ventricular  end-diastolic pressure.   2. Right ventricular systolic function is normal. The right ventricular  size is normal. There is normal pulmonary artery systolic pressure.   3. Left atrial size was mildly dilated.   4. The mitral valve is normal in structure. Trivial mitral valve  regurgitation. No evidence of mitral stenosis.   5. The aortic valve was not well visualized. There is mild calcification  of the aortic valve. Aortic valve regurgitation is not visualized. Mild  aortic valve stenosis. Aortic valve area, by VTI measures 1.58 cm. Aortic  valve mean gradient measures 12.0   mmHg. Aortic valve Vmax measures 2.36 m/s.   6. The inferior vena cava is normal in size with greater than 50%  respiratory variability, suggesting right atrial pressure of 3 mmHg.     Neuro/Psych H/o ICH CVA    GI/Hepatic ,GERD   Medicated,,  Endo/Other  diabetes, Type 2, Insulin Dependent, Oral Hypoglycemic Agents    Renal/GU CRFRenal disease     Musculoskeletal   Abdominal   Peds  Hematology   Anesthesia Other Findings   Reproductive/Obstetrics                             Anesthesia Physical Anesthesia Plan  ASA: 3  Anesthesia Plan: General   Post-op Pain Management:    Induction: Intravenous  PONV Risk Score and Plan: 1 and Ondansetron, Dexamethasone and Treatment may vary due to age or medical condition  Airway Management Planned: Oral ETT and Video Laryngoscope Planned  Additional Equipment:   Intra-op Plan:   Post-operative Plan: Extubation in OR  Informed Consent: I have reviewed the patients History and Physical, chart, labs and discussed the procedure including the risks, benefits and alternatives for the proposed anesthesia with the patient or authorized representative who has indicated his/her understanding and acceptance.     Dental advisory given  Plan Discussed with: CRNA and Anesthesiologist  Anesthesia Plan Comments: (Risks of general anesthesia discussed including, but not limited to, sore throat, hoarse voice, chipped/damaged teeth, injury to vocal cords, nausea and vomiting, allergic reactions, lung infection, heart attack, stroke, and death. All questions answered. )       Anesthesia Quick Evaluation

## 2023-05-30 NOTE — Progress Notes (Signed)
PROGRESS NOTE    RECTOR BELYEU  ZOX:[id number] DOB: 02-06-1956 DOA: 05/24/2023 PCP: Pcp, No     Brief Narrative:  67 year old incarcerated male with history of asthma, DM 2, HTN, CKD stage III A, CVA presents to the ED with complaints of arm swelling.  Patient was [facility] on August 28 after being treated for cellulitis of the left forearm.  At that time patient completed course of vancomycin and Unasyn and eventually transition to Augmentin and doxycycline.  Returns back to the hospital due to worsening pain and swelling.  X-ray of the elbow shows diffuse edema.  CT performed eventually showed worsening cellulitis with larger abscess therefore orthopedic consulted who plans on the I&D. Underwent extensive I&D by orthopedic on 8/29 with application of wound VAC.  Anticipate return to the OR on 9/3.  ID following     Assessment & Plan:  Principal Problem:   Left arm cellulitis   Left upper extremity cellulitis with progressing abscess - Failed outpatient antibiotic treatment.  Currently on broad-spectrum.  CT scan of left upper extremity showed severe cellulitis with progressive abscess/inflammation.  Underwent extensive I&D by orthopedic on 8/29 with application of wound VAC.  Anticipate return to the OR on 9/3.  ID following Current Abx: Cefepime and Daptomycin.   On 8/29 at the request of management, I discussed case with patient's Prison [facility] provider Dr Camelia Phenes on [phone]. He advised me to complete his surgery here in the hospital and once patient is medically stable he can be transferred back to prison system on IV or p.o. antibiotic which ever is necessary   History of asthma - Bronchodilators as needed   Diabetes mellitus type 2 - Diabetic diet.  Sliding scale and Accu-Cheks. - Adjust insulin as necessary.   CKD stage IIIa -Creatinine around baseline of 1.5   Essential hypertension -Norvasc, Coreg, lisinopril.  IV as  needed -Discontinue HCTZ, was started on admission   History of CVA -MRI was positive for CVA in July 2024.  On statin   Depression -Cymbalta   DVT prophylaxis: enoxaparin (LOVENOX) injection 40 mg Start: 05/25/23 1000 SCDs Start: 05/24/23 2247 Code Status: Full code Family Communication:   On going surgical and Medical management for his LUE. Will need to go back to the OR on 9/3; earliest dc 9/4                 Subjective: Resting comfortably, no complaints   Examination:  General exam: Appears calm and comfortable  Respiratory system: Clear to auscultation. Respiratory effort normal. Cardiovascular system: S1 & S2 heard, RRR. No JVD, murmurs, rubs, gallops or clicks. No pedal edema. Gastrointestinal system: Abdomen is nondistended, soft and nontender. No organomegaly or masses felt. Normal bowel sounds heard. Central nervous system: Alert and oriented. No focal neurological deficits. Extremities: Symmetric 5 x 5 power. Skin: Left upper extremity dressing and wound VAC in place Psychiatry: Judgement and insight appear normal. Mood & affect appropriate.       Diet Orders (From admission, onward)     Start     Ordered   05/26/23 1603  Diet Carb Modified Fluid consistency: Thin; Room service appropriate? Yes  Diet effective now       Question Answer Comment  Diet-HS Snack? Nothing   Calorie Level Medium 1600-2000   Fluid consistency: Thin   Room service appropriate? Yes      05/26/23 1602  Objective: Vitals:   05/29/23 1315 05/29/23 2156 05/30/23 0637 05/30/23 0737  BP: (!) 163/67 (!) 106/39 (!) 172/68 (!) 142/59  Pulse: 62 66 67 62  Resp: 18 16 16 16   Temp: 97.7 F (36.5 C)   (!) 97.2 F (36.2 C)  TempSrc: Axillary   Axillary  SpO2: 100% 99% 100% 100%  Weight:      Height:        Intake/Output Summary (Last 24 hours) at 05/30/2023 1056 Last data filed at 05/30/2023 4098 Gross per 24 hour  Intake 776.06 ml  Output 2500 ml  Net  -1723.94 ml   Filed Weights   05/24/23 1928 05/26/23 1321  Weight: 99.8 kg 99.8 kg    Scheduled Meds:  amLODipine  10 mg Oral Daily   atorvastatin  10 mg Oral QHS   carvedilol  6.25 mg Oral BID WC   Chlorhexidine Gluconate Cloth  6 each Topical Q0600   DULoxetine  30 mg Oral BID   enoxaparin (LOVENOX) injection  40 mg Subcutaneous Daily   insulin aspart  0-5 Units Subcutaneous QHS   insulin aspart  0-9 Units Subcutaneous TID WC   insulin glargine-yfgn  5 Units Subcutaneous Daily   lisinopril  40 mg Oral Daily   mometasone-formoterol  2 puff Inhalation BID   pantoprazole  40 mg Oral Daily   sodium chloride flush  3 mL Intravenous Q12H   Continuous Infusions:  ceFEPime (MAXIPIME) IV 2 g (05/30/23 0959)   DAPTOmycin (CUBICIN) 650 mg in sodium chloride 0.9 % IVPB Stopped (05/29/23 1437)    Nutritional status     Body mass index is 34.46 kg/m.  Data Reviewed:   CBC: Recent Labs  Lab 05/24/23 1705 05/24/23 2349 05/26/23 0403 05/27/23 0441 05/28/23 0336 05/29/23 0331 05/30/23 0312  WBC 20.5*   < > 15.4* 21.7* 17.0* 13.0* 10.9*  NEUTROABS 16.9*  --   --   --   --   --   --   HGB 11.8*   < > 11.0* 12.5* 9.7* 9.6* 10.5*  HCT 35.5*   < > 33.1* 36.5* 28.8* 28.7* 31.5*  MCV 85.5   < > 85.3 84.1 83.7 83.7 85.6  PLT 388   < > 335 207 263 237 255   < > = values in this interval not displayed.   Basic Metabolic Panel: Recent Labs  Lab 05/26/23 0403 05/27/23 0441 05/28/23 0336 05/29/23 0331 05/30/23 0312  NA 133* 132* 132* 130* 132*  K 3.4* 3.9 4.0 4.1 4.0  CL 104 106 105 100 102  CO2 21* 19* 20* 23 22  GLUCOSE 140* 142* 72 139* 118*  BUN 25* 26* 30* 34* 34*  CREATININE 1.45* 1.32* 1.53* 1.38* 1.41*  CALCIUM 8.0* 8.0* 7.8* 7.7* 8.3*  MG 1.6* 1.5* 2.4 2.1 1.9  PHOS 3.8  --   --   --   --    GFR: Estimated Creatinine Clearance: 55.7 mL/min (A) (by C-G formula based on SCr of 1.41 mg/dL (H)). Liver Function Tests: Recent Labs  Lab 05/24/23 1705  AST 17  ALT  14  ALKPHOS 114  BILITOT 0.8  PROT 5.9*  ALBUMIN 2.1*   No results for input(s): "LIPASE", "AMYLASE" in the last 168 hours. No results for input(s): "AMMONIA" in the last 168 hours. Coagulation Profile: No results for input(s): "INR", "PROTIME" in the last 168 hours. Cardiac Enzymes: Recent Labs  Lab 05/28/23 0336  CKTOTAL 19*   BNP (last 3 results) No results for input(s): "PROBNP"  in the last 8760 hours. HbA1C: No results for input(s): "HGBA1C" in the last 72 hours. CBG: Recent Labs  Lab 05/29/23 0732 05/29/23 1132 05/29/23 1528 05/29/23 2153 05/30/23 0951  GLUCAP 139* 138* 140* 119* 120*   Lipid Profile: No results for input(s): "CHOL", "HDL", "LDLCALC", "TRIG", "CHOLHDL", "LDLDIRECT" in the last 72 hours. Thyroid Function Tests: No results for input(s): "TSH", "T4TOTAL", "FREET4", "T3FREE", "THYROIDAB" in the last 72 hours. Anemia Panel: No results for input(s): "VITAMINB12", "FOLATE", "FERRITIN", "TIBC", "IRON", "RETICCTPCT" in the last 72 hours. Sepsis Labs: Recent Labs  Lab 05/24/23 1712 05/24/23 1910  LATICACIDVEN 1.0 1.7    Recent Results (from the past 240 hour(s))  Blood culture (routine x 2)     Status: None   Collection Time: 05/24/23  5:15 PM   Specimen: BLOOD  Result Value Ref Range Status   Specimen Description BLOOD LEFT ANTECUBITAL  Final   Special Requests   Final    BOTTLES DRAWN AEROBIC ONLY Blood Culture adequate volume   Culture   Final    NO GROWTH 5 DAYS Performed at [facility], [address] [place], [place] [address]    Report Status 05/29/2023 FINAL  Final  Blood culture (routine x 2)     Status: None   Collection Time: 05/24/23  6:57 PM   Specimen: BLOOD RIGHT HAND  Result Value Ref Range Status   Specimen Description BLOOD RIGHT HAND  Final   Special Requests   Final    BOTTLES DRAWN AEROBIC ONLY Blood Culture results may not be optimal due to an inadequate volume of blood received in culture bottles   Culture    Final    NO GROWTH 5 DAYS Performed at [facility], [address] [place], [place] [address]    Report Status 05/29/2023 FINAL  Final  Aerobic/Anaerobic Culture w Gram Stain (surgical/deep wound)     Status: None (Preliminary result)   Collection Time: 05/26/23  2:24 PM   Specimen: Abscess  Result Value Ref Range Status   Specimen Description ABSCESS LEFT ARM  Final   Special Requests NONE  Final   Gram Stain   Final    ABUNDANT WBC PRESENT, PREDOMINANTLY PMN NO ORGANISMS SEEN    Culture   Final    NO GROWTH 4 DAYS NO ANAEROBES ISOLATED; CULTURE IN PROGRESS FOR 5 DAYS Performed at [facility], [address], [place], [place] [address]    Report Status PENDING  Incomplete  Aerobic/Anaerobic Culture w Gram Stain (surgical/deep wound)     Status: None (Preliminary result)   Collection Time: 05/26/23  3:14 PM   Specimen: Soft Tissue, Other  Result Value Ref Range Status   Specimen Description TISSUE LEFT ARM  Final   Special Requests FROM LEFT ARM ABSCESS  Final   Gram Stain   Final    ABUNDANT WBC PRESENT, PREDOMINANTLY PMN FEW GRAM POSITIVE COCCI IN PAIRS IN CHAINS Gram Stain Report Called to,Read Back By and Verified With: RN Leslye Peer [phone] @ [phone] FH    Culture   Final    NO GROWTH 4 DAYS NO ANAEROBES ISOLATED; CULTURE IN PROGRESS FOR 5 DAYS Performed at [facility], [address] [place] [address] [place], [place] [address]    Report Status PENDING  Incomplete  Acid Fast Smear (AFB)     Status: None   Collection Time: 05/26/23  3:14 PM   Specimen: Soft Tissue, Other  Result Value Ref Range Status  AFB Specimen Processing Comment  Final    Comment: Tissue Grinding and Digestion/Decontamination   Acid Fast Smear Negative  Final    Comment: (NOTE) Performed At: [facility] [address] [place], [place] [id number] Jolene Schimke MD WU:[id number]    Source (AFB) TISSUE  Final    Comment: LEFT ABSCESS FROM ARM Performed at [facility],  [address], [place], [place] [address]          Radiology Studies: No results found.         LOS: 5 days   Time spent= 35 mins    Miguel Rota, MD [facility]  If 7PM-7AM, please contact night-coverage  05/30/2023, 10:56 AM

## 2023-05-30 NOTE — Progress Notes (Signed)
Noted swelling to the [facility] wrist area, this nurse removed arm band and request hand cuff be adjusted.

## 2023-05-30 NOTE — Progress Notes (Signed)
Pt. Ate one cup of apple sauce and 180 of ensure with assistance.

## 2023-05-30 NOTE — Progress Notes (Signed)
4 Days Post-Op Procedure(s) (LRB): IRRIGATION AND DEBRIDEMENT LEFT UPPER EXTREMITY (Left)  Subjective:  Patient somnolent, lying in fetal position on R side, appears comfortable.    Easily woken but difficulty answering questions.  Reports some pain.  Incoherent speech, returns to sleep.    Had extensive debridement performed of multiloculated abscess from left upper arm and forearm.    No issues with woundvac.  Bandages appear clean.    Plan to return to OR this Tuesday with Dr. Carola Frost for repeat I&D and possible closure vs VAC change.    Objective:   VITALS:   Vitals:   05/29/23 1315 05/29/23 2156 05/30/23 0637 05/30/23 0737  BP: (!) 163/67 (!) 106/39 (!) 172/68 (!) 142/59  Pulse: 62 66 67 62  Resp: 18 16 16 16   Temp: 97.7 F (36.5 C)   (!) 97.2 F (36.2 C)  TempSrc: Axillary   Axillary  SpO2: 100% 99% 100% 100%  Weight:      Height:        Gen: somnolent, lying on back, does wake up to gentle touch with garbled speech Lungs: unlabored Ext:       Left Upper Extremity              Splint fitting well             Sling is off             Vac with good suction and seal, ~260cc total over several days             Ext warm              Mild edema to hand              Not cooperating for motor or sensory exam, though does move all fingers when I ask him              Good perfusion distally    Lab Results  Component Value Date   WBC 10.9 (H) 05/30/2023   HGB 10.5 (L) 05/30/2023   HCT 31.5 (L) 05/30/2023   MCV 85.6 05/30/2023   PLT 255 05/30/2023   BMET    Component Value Date/Time   NA 132 (L) 05/30/2023 0312   K 4.0 05/30/2023 0312   CL 102 05/30/2023 0312   CO2 22 05/30/2023 0312   GLUCOSE 118 (H) 05/30/2023 0312   BUN 34 (H) 05/30/2023 0312   CREATININE 1.41 (H) 05/30/2023 0312   CALCIUM 8.3 (L) 05/30/2023 0312   GFRNONAA 54 (L) 05/30/2023 0312    Assessment/Plan: 4 Days Post-Op   Principal Problem:   Left arm cellulitis  67 y/o male with  large L upper extremity abscess    - L upper extremity abscess s/p I&D and placement of veraflo VAC             NWB LUEx             Sling and splint             Ok to move fingers  Hgb stable             Vac converted to veraflo setting              Return to OR Tuesday for repeat I&D and possible closure              Ice and elevate to help with swelling  Fingers above elbow and elbow above heart   - ID:              ID has been consulted, continue IV antibiotics per ID              Intra-op cultures/gram stain                          Gram positive cocci in pairs in chains   - Dispo:             Continue with inpatient care    Cecil Cobbs 05/30/2023, 10:05 AM   Weber Cooks, MD  Contact information:   [phone] 7am-5pm epic message Dr. Blanchie Dessert, or call office for patient follow up: [phone] After hours and holidays please check [email or web address] for group call information for [facility]

## 2023-05-30 NOTE — Plan of Care (Signed)
  Problem: Nutrition: Goal: Adequate nutrition will be maintained Outcome: Progressing   

## 2023-05-31 ENCOUNTER — Other Ambulatory Visit: Payer: Self-pay

## 2023-05-31 ENCOUNTER — Inpatient Hospital Stay (HOSPITAL_COMMUNITY): Payer: Medicaid Other | Admitting: Anesthesiology

## 2023-05-31 ENCOUNTER — Encounter (HOSPITAL_COMMUNITY): Admission: EM | Payer: Self-pay | Source: Home / Self Care | Attending: Internal Medicine

## 2023-05-31 ENCOUNTER — Encounter (HOSPITAL_COMMUNITY): Payer: Self-pay | Admitting: Family Medicine

## 2023-05-31 DIAGNOSIS — F17210 Nicotine dependence, cigarettes, uncomplicated: Secondary | ICD-10-CM | POA: Diagnosis not present

## 2023-05-31 DIAGNOSIS — I129 Hypertensive chronic kidney disease with stage 1 through stage 4 chronic kidney disease, or unspecified chronic kidney disease: Secondary | ICD-10-CM

## 2023-05-31 DIAGNOSIS — N1831 Chronic kidney disease, stage 3a: Secondary | ICD-10-CM

## 2023-05-31 DIAGNOSIS — L02414 Cutaneous abscess of left upper limb: Secondary | ICD-10-CM | POA: Diagnosis not present

## 2023-05-31 DIAGNOSIS — L03114 Cellulitis of left upper limb: Secondary | ICD-10-CM | POA: Diagnosis not present

## 2023-05-31 HISTORY — PX: I & D EXTREMITY: SHX5045

## 2023-05-31 LAB — AEROBIC/ANAEROBIC CULTURE W GRAM STAIN (SURGICAL/DEEP WOUND): Culture: NO GROWTH

## 2023-05-31 LAB — BASIC METABOLIC PANEL
Anion gap: 7 (ref 5–15)
BUN: 45 mg/dL — ABNORMAL HIGH (ref 8–23)
CO2: 22 mmol/L (ref 22–32)
Calcium: 8.3 mg/dL — ABNORMAL LOW (ref 8.9–10.3)
Chloride: 102 mmol/L (ref 98–111)
Creatinine, Ser: 1.55 mg/dL — ABNORMAL HIGH (ref 0.61–1.24)
GFR, Estimated: 48 mL/min — ABNORMAL LOW (ref >60)
Glucose, Bld: 150 mg/dL — ABNORMAL HIGH (ref 70–99)
Potassium: 3.7 mmol/L (ref 3.5–5.1)
Sodium: 131 mmol/L — ABNORMAL LOW (ref 135–145)

## 2023-05-31 LAB — GLUCOSE, CAPILLARY
Glucose-Capillary: 148 mg/dL — ABNORMAL HIGH (ref 70–99)
Glucose-Capillary: 139 mg/dL — ABNORMAL HIGH (ref 70–99)
Glucose-Capillary: 130 mg/dL — ABNORMAL HIGH (ref 70–99)
Glucose-Capillary: 175 mg/dL — ABNORMAL HIGH (ref 70–99)
Glucose-Capillary: 206 mg/dL — ABNORMAL HIGH (ref 70–99)

## 2023-05-31 LAB — MAGNESIUM: Magnesium: 1.9 mg/dL (ref 1.7–2.4)

## 2023-05-31 LAB — CBC
HCT: 30.6 % — ABNORMAL LOW (ref 39.0–52.0)
Hemoglobin: 10.3 g/dL — ABNORMAL LOW (ref 13.0–17.0)
MCH: 28.9 pg (ref 26.0–34.0)
MCHC: 33.7 g/dL (ref 30.0–36.0)
MCV: 85.7 fL (ref 80.0–100.0)
Platelets: 248 10*3/uL (ref 150–400)
RBC: 3.57 MIL/uL — ABNORMAL LOW (ref 4.22–5.81)
RDW: 13.5 % (ref 11.5–15.5)
WBC: 13.2 10*3/uL — ABNORMAL HIGH (ref 4.0–10.5)
nRBC: 0 % (ref 0.0–0.2)

## 2023-05-31 LAB — MRSA NEXT GEN BY PCR, NASAL: MRSA by PCR Next Gen: NOT DETECTED

## 2023-05-31 SURGERY — IRRIGATION AND DEBRIDEMENT EXTREMITY
Anesthesia: General | Site: Elbow | Laterality: Left

## 2023-05-31 MED ORDER — AMISULPRIDE (ANTIEMETIC) 5 MG/2ML IV SOLN: 10.0000 mg | Freq: Once | INTRAVENOUS | Status: DC | PRN

## 2023-05-31 MED ORDER — OXYCODONE HCL 5 MG/5ML PO SOLN: 5.0000 mg | Freq: Once | ORAL | Status: DC | PRN

## 2023-05-31 MED ORDER — PROPOFOL 10 MG/ML IV BOLUS
INTRAVENOUS | Status: AC
  Filled 2023-05-31: qty 20

## 2023-05-31 MED ORDER — DEXAMETHASONE SODIUM PHOSPHATE 10 MG/ML IJ SOLN
INTRAMUSCULAR | Status: DC | PRN
  Administered 2023-05-31: 5 mg via INTRAVENOUS

## 2023-05-31 MED ORDER — ONDANSETRON HCL 4 MG/2ML IJ SOLN
INTRAMUSCULAR | Status: DC | PRN
  Administered 2023-05-31: 4 mg via INTRAVENOUS

## 2023-05-31 MED ORDER — OXYCODONE HCL 5 MG PO TABS: 5.0000 mg | ORAL_TABLET | Freq: Once | ORAL | Status: DC | PRN

## 2023-05-31 MED ORDER — SUGAMMADEX SODIUM 200 MG/2ML IV SOLN: INTRAVENOUS | Status: DC | PRN

## 2023-05-31 MED ORDER — CHLORHEXIDINE GLUCONATE 0.12 % MT SOLN: 15.0000 mL | Freq: Once | OROMUCOSAL | Status: DC

## 2023-05-31 MED ORDER — DEXAMETHASONE SODIUM PHOSPHATE 10 MG/ML IJ SOLN
INTRAMUSCULAR | Status: AC
  Filled 2023-05-31: qty 1

## 2023-05-31 MED ORDER — ROCURONIUM BROMIDE 10 MG/ML (PF) SYRINGE
PREFILLED_SYRINGE | INTRAVENOUS | Status: DC | PRN
  Administered 2023-05-31: 60 mg via INTRAVENOUS

## 2023-05-31 MED ORDER — SUGAMMADEX SODIUM 200 MG/2ML IV SOLN
INTRAVENOUS | Status: DC | PRN
  Administered 2023-05-31: 200 mg via INTRAVENOUS

## 2023-05-31 MED ORDER — FENTANYL CITRATE (PF) 250 MCG/5ML IJ SOLN
INTRAMUSCULAR | Status: DC | PRN
  Administered 2023-05-31 (×2): 25 ug via INTRAVENOUS

## 2023-05-31 MED ORDER — EPHEDRINE SULFATE-NACL 50-0.9 MG/10ML-% IV SOSY
PREFILLED_SYRINGE | INTRAVENOUS | Status: DC | PRN
  Administered 2023-05-31 (×2): 2.5 mg via INTRAVENOUS

## 2023-05-31 MED ORDER — LIDOCAINE 2% (20 MG/ML) 5 ML SYRINGE
INTRAMUSCULAR | Status: DC | PRN
  Administered 2023-05-31: 100 mg via INTRAVENOUS

## 2023-05-31 MED ORDER — MIDAZOLAM HCL 2 MG/2ML IJ SOLN
INTRAMUSCULAR | Status: AC
  Filled 2023-05-31: qty 2

## 2023-05-31 MED ORDER — LACTATED RINGERS IV SOLN: INTRAVENOUS | Status: DC

## 2023-05-31 MED ORDER — ONDANSETRON HCL 4 MG/2ML IJ SOLN
INTRAMUSCULAR | Status: AC
  Filled 2023-05-31: qty 2

## 2023-05-31 MED ORDER — LIDOCAINE 2% (20 MG/ML) 5 ML SYRINGE
INTRAMUSCULAR | Status: AC
  Filled 2023-05-31: qty 5

## 2023-05-31 MED ORDER — FENTANYL CITRATE (PF) 250 MCG/5ML IJ SOLN
INTRAMUSCULAR | Status: AC
  Filled 2023-05-31: qty 5

## 2023-05-31 MED ORDER — INSULIN ASPART 100 UNIT/ML IJ SOLN: 0.0000 [IU] | INTRAMUSCULAR | Status: DC | PRN

## 2023-05-31 MED ORDER — ORAL CARE MOUTH RINSE: 15.0000 mL | Freq: Once | OROMUCOSAL | Status: DC

## 2023-05-31 MED ORDER — EPHEDRINE 5 MG/ML INJ
INTRAVENOUS | Status: AC
  Filled 2023-05-31: qty 5

## 2023-05-31 MED ORDER — ROCURONIUM BROMIDE 10 MG/ML (PF) SYRINGE
PREFILLED_SYRINGE | INTRAVENOUS | Status: AC
  Filled 2023-05-31: qty 10

## 2023-05-31 MED ORDER — FENTANYL CITRATE (PF) 100 MCG/2ML IJ SOLN: 25.0000 ug | INTRAMUSCULAR | Status: DC | PRN

## 2023-05-31 MED ORDER — PROPOFOL 10 MG/ML IV BOLUS
INTRAVENOUS | Status: DC | PRN
  Administered 2023-05-31: 120 mg via INTRAVENOUS

## 2023-05-31 MED ORDER — PHENYLEPHRINE 80 MCG/ML (10ML) SYRINGE FOR IV PUSH (FOR BLOOD PRESSURE SUPPORT)
PREFILLED_SYRINGE | INTRAVENOUS | Status: DC | PRN
  Administered 2023-05-31: 80 ug via INTRAVENOUS
  Administered 2023-05-31: 160 ug via INTRAVENOUS
  Administered 2023-05-31 (×5): 80 ug via INTRAVENOUS

## 2023-05-31 SURGICAL SUPPLY — 51 items
BAG COUNTER SPONGE SURGICOUNT (BAG) IMPLANT
BAG SPNG CNTER NS LX DISP (BAG)
BNDG ADH 5X4 AIR PERM ELC (GAUZE/BANDAGES/DRESSINGS) ×1
BNDG CMPR 6 X 5 YARDS HK CLSR (GAUZE/BANDAGES/DRESSINGS) ×1
BNDG COHESIVE 4X5 TAN STRL (GAUZE/BANDAGES/DRESSINGS) ×1 IMPLANT
BNDG COHESIVE 4X5 WHT NS (GAUZE/BANDAGES/DRESSINGS) IMPLANT
BNDG ELASTIC 6INX 5YD STR LF (GAUZE/BANDAGES/DRESSINGS) IMPLANT
BNDG GAUZE DERMACEA FLUFF 4 (GAUZE/BANDAGES/DRESSINGS) ×2 IMPLANT
BNDG GZE DERMACEA 4 6PLY (GAUZE/BANDAGES/DRESSINGS) ×2
BRUSH SCRUB EZ PLAIN DRY (MISCELLANEOUS) ×2 IMPLANT
COVER SURGICAL LIGHT HANDLE (MISCELLANEOUS) ×2 IMPLANT
DRSG ADAPTIC 3X8 NADH LF (GAUZE/BANDAGES/DRESSINGS) ×1 IMPLANT
DRSG MEPITEL 4X7.2 (GAUZE/BANDAGES/DRESSINGS) IMPLANT
ELECT REM PT RETURN 9FT ADLT (ELECTROSURGICAL)
ELECTRODE REM PT RTRN 9FT ADLT (ELECTROSURGICAL) IMPLANT
GAUZE PAD ABD 8X10 STRL (GAUZE/BANDAGES/DRESSINGS) IMPLANT
GAUZE SPONGE 4X4 12PLY STRL (GAUZE/BANDAGES/DRESSINGS) ×1 IMPLANT
GLOVE BIO SURGEON STRL SZ7.5 (GLOVE) ×1 IMPLANT
GLOVE BIO SURGEON STRL SZ8 (GLOVE) ×1 IMPLANT
GLOVE BIOGEL PI IND STRL 7.5 (GLOVE) ×1 IMPLANT
GLOVE BIOGEL PI IND STRL 8 (GLOVE) ×1 IMPLANT
GLOVE BIOGEL PI IND STRL 9 (GLOVE) ×1 IMPLANT
GLOVE SURG ORTHO LTX SZ7.5 (GLOVE) ×2 IMPLANT
GOWN STRL REUS W/ TWL LRG LVL3 (GOWN DISPOSABLE) ×2 IMPLANT
GOWN STRL REUS W/ TWL XL LVL3 (GOWN DISPOSABLE) ×1 IMPLANT
GOWN STRL REUS W/TWL LRG LVL3 (GOWN DISPOSABLE) ×2
GOWN STRL REUS W/TWL XL LVL3 (GOWN DISPOSABLE) ×1
KIT BASIN OR (CUSTOM PROCEDURE TRAY) ×1 IMPLANT
KIT TURNOVER KIT B (KITS) ×1 IMPLANT
MANIFOLD NEPTUNE II (INSTRUMENTS) ×1 IMPLANT
NS IRRIG 1000ML POUR BTL (IV SOLUTION) ×1 IMPLANT
PACK ORTHO EXTREMITY (CUSTOM PROCEDURE TRAY) ×1 IMPLANT
PAD ARMBOARD 7.5X6 YLW CONV (MISCELLANEOUS) ×2 IMPLANT
PAD CAST 4YDX4 CTTN HI CHSV (CAST SUPPLIES) IMPLANT
PADDING CAST COTTON 4X4 STRL (CAST SUPPLIES) ×2
PADDING CAST COTTON 6X4 STRL (CAST SUPPLIES) ×1 IMPLANT
SOL PREP POV-IOD 4OZ 10% (MISCELLANEOUS) ×1 IMPLANT
SOL SCRUB PVP POV-IOD 4OZ 7.5% (MISCELLANEOUS) ×1
SOLUTION SCRB POV-IOD 4OZ 7.5% (MISCELLANEOUS) ×1 IMPLANT
SPLINT PLASTER CAST FAST 5X30 (CAST SUPPLIES) IMPLANT
SPONGE T-LAP 18X18 ~~LOC~~+RFID (SPONGE) ×1 IMPLANT
STOCKINETTE IMPERVIOUS 9X36 MD (GAUZE/BANDAGES/DRESSINGS) IMPLANT
SUT ETHILON 2 0 PSLX (SUTURE) IMPLANT
SUT ETHILON O TP 1 (SUTURE) IMPLANT
SUT PDS AB 2-0 CT1 27 (SUTURE) IMPLANT
TOWEL GREEN STERILE (TOWEL DISPOSABLE) ×2 IMPLANT
TOWEL GREEN STERILE FF (TOWEL DISPOSABLE) ×1 IMPLANT
TUBE CONNECTING 12X1/4 (SUCTIONS) ×1 IMPLANT
UNDERPAD 30X36 HEAVY ABSORB (UNDERPADS AND DIAPERS) ×1 IMPLANT
WATER STERILE IRR 1000ML POUR (IV SOLUTION) ×1 IMPLANT
YANKAUER SUCT BULB TIP NO VENT (SUCTIONS) ×1 IMPLANT

## 2023-05-31 NOTE — Progress Notes (Signed)
PROGRESS NOTE    KENENNA KOOIKER  WUJ:[id number] DOB: [birth year] DOA: 05/24/2023 PCP: Pcp, No     Brief Narrative:  68 year old incarcerated male with history of asthma, DM 2, HTN, CKD stage III A, CVA presents to the ED with complaints of arm swelling.  Patient was [facility] on August 28 after being treated for cellulitis of the left forearm.  At that time patient completed course of vancomycin and Unasyn and eventually transition to Augmentin and doxycycline.  Returns back to the hospital due to worsening pain and swelling.  X-ray of the elbow shows diffuse edema.  CT performed eventually showed worsening cellulitis with larger abscess therefore orthopedic consulted who plans on the I&D. Underwent extensive I&D by orthopedic on 8/29 with application of wound VAC.  ID Following. OR again today.      Assessment & Plan:  Principal Problem:   Left arm cellulitis   Left upper extremity cellulitis with progressing abscess - Failed outpatient antibiotic treatment.  Currently on broad-spectrum.  CT scan of left upper extremity showed severe cellulitis with progressive abscess/inflammation.  Underwent extensive I&D by orthopedic on 8/29 with application of wound VAC.  Anticipate return to the OR again today.  ID following Current Abx: Cefepime and Daptomycin.   On 8/29 at the request of management, I discussed case with patient's [facility] provider Dr Camelia Phenes on [phone]. He advised me to complete his surgery here in the hospital and once patient is medically stable he can be transferred back to prison system on IV or p.o. antibiotic which ever is necessary   History of asthma - Bronchodilators as needed   Diabetes mellitus type 2 - Diabetic diet.  Sliding scale and Accu-Cheks. - Adjust insulin as necessary.   CKD stage IIIa -Creatinine around baseline of 1.5   Essential hypertension -Norvasc, Coreg, lisinopril.  IV as needed -Discontinue HCTZ, was  started on admission   History of CVA -MRI was positive for CVA in July 2024.  On statin   Depression -Cymbalta   DVT prophylaxis: enoxaparin (LOVENOX) injection 40 mg Start: 05/25/23 1000 SCDs Start: 05/24/23 2247 Code Status: Full code Family Communication:   On going surgical and Medical management for his LUE.OR again today. If Ortho completes all his surgical care here, he can be transferred to [facility] in next 24-48 hrs as mentioned above per my discussion with their provider.            Subjective: Seen before his surgery.  Sleepy but no new complaints.   Examination:  General exam: Appears calm and comfortable  Respiratory system: Clear to auscultation. Respiratory effort normal. Cardiovascular system: S1 & S2 heard, RRR. No JVD, murmurs, rubs, gallops or clicks. No pedal edema. Gastrointestinal system: Abdomen is nondistended, soft and nontender. No organomegaly or masses felt. Normal bowel sounds heard. Central nervous system: Alert and oriented. No focal neurological deficits. Extremities: Symmetric 5 x 5 power. Skin: Left upper extremity dressing in place.  Wound VAC in place Psychiatry: Judgement and insight appear normal. Mood & affect appropriate.       Diet Orders (From admission, onward)     Start     Ordered   05/31/23 0001  Diet NPO time specified  Diet effective midnight        05/30/23 2017            Objective: Vitals:   05/30/23 1356 05/30/23 2006 05/31/23 0802 05/31/23 1038  BP: (!) 141/75 137/73 Marland Kitchen)  119/58 125/78  Pulse: 74 72 70 77  Resp: 18 18 16 18   Temp: (!) 97.5 F (36.4 C)  (!) 97.5 F (36.4 C) 97.9 F (36.6 C)  TempSrc: Axillary  Oral Oral  SpO2: 98% 97% 97% 98%  Weight:    68 kg  Height:    5' 6.5" (1.689 m)    Intake/Output Summary (Last 24 hours) at 05/31/2023 1127 Last data filed at 05/31/2023 0700 Gross per 24 hour  Intake 550 ml  Output 450 ml  Net 100 ml   Filed Weights   05/24/23 1928  05/26/23 1321 05/31/23 1038  Weight: 99.8 kg 99.8 kg 68 kg    Scheduled Meds:  [MAR Hold] amLODipine  10 mg Oral Daily   [MAR Hold] atorvastatin  10 mg Oral QHS   [MAR Hold] carvedilol  6.25 mg Oral BID WC   chlorhexidine  15 mL Mouth/Throat Once   Or   mouth rinse  15 mL Mouth Rinse Once   [MAR Hold] Chlorhexidine Gluconate Cloth  6 each Topical Q0600   [MAR Hold] DULoxetine  30 mg Oral BID   [MAR Hold] enoxaparin (LOVENOX) injection  40 mg Subcutaneous Daily   [MAR Hold] insulin aspart  0-5 Units Subcutaneous QHS   [MAR Hold] insulin aspart  0-9 Units Subcutaneous TID WC   [MAR Hold] insulin glargine-yfgn  5 Units Subcutaneous Daily   [MAR Hold] lisinopril  40 mg Oral Daily   [MAR Hold] mometasone-formoterol  2 puff Inhalation BID   [MAR Hold] pantoprazole  40 mg Oral Daily   [MAR Hold] sodium chloride flush  3 mL Intravenous Q12H   Continuous Infusions:  [MAR Hold] ceFEPime (MAXIPIME) IV 2 g (05/31/23 1017)   [MAR Hold] DAPTOmycin (CUBICIN) 650 mg in sodium chloride 0.9 % IVPB Stopped (05/30/23 1442)   lactated ringers 10 mL/hr at 05/31/23 1053    Nutritional status     Body mass index is 23.85 kg/m.  Data Reviewed:   CBC: Recent Labs  Lab 05/24/23 1705 05/24/23 2349 05/27/23 0441 05/28/23 0336 05/29/23 0331 05/30/23 0312 05/31/23 0444  WBC 20.5*   < > 21.7* 17.0* 13.0* 10.9* 13.2*  NEUTROABS 16.9*  --   --   --   --   --   --   HGB 11.8*   < > 12.5* 9.7* 9.6* 10.5* 10.3*  HCT 35.5*   < > 36.5* 28.8* 28.7* 31.5* 30.6*  MCV 85.5   < > 84.1 83.7 83.7 85.6 85.7  PLT 388   < > 207 263 237 255 248   < > = values in this interval not displayed.   Basic Metabolic Panel: Recent Labs  Lab 05/26/23 0403 05/27/23 0441 05/28/23 0336 05/29/23 0331 05/30/23 0312 05/31/23 0444  NA 133* 132* 132* 130* 132* 131*  K 3.4* 3.9 4.0 4.1 4.0 3.7  CL 104 106 105 100 102 102  CO2 21* 19* 20* 23 22 22   GLUCOSE 140* 142* 72 139* 118* 150*  BUN 25* 26* 30* 34* 34* 45*   CREATININE 1.45* 1.32* 1.53* 1.38* 1.41* 1.55*  CALCIUM 8.0* 8.0* 7.8* 7.7* 8.3* 8.3*  MG 1.6* 1.5* 2.4 2.1 1.9 1.9  PHOS 3.8  --   --   --   --   --    GFR: Estimated Creatinine Clearance: 41.4 mL/min (A) (by C-G formula based on SCr of 1.55 mg/dL (H)). Liver Function Tests: Recent Labs  Lab 05/24/23 1705  AST 17  ALT 14  ALKPHOS 114  BILITOT 0.8  PROT 5.9*  ALBUMIN 2.1*   No results for input(s): "LIPASE", "AMYLASE" in the last 168 hours. No results for input(s): "AMMONIA" in the last 168 hours. Coagulation Profile: No results for input(s): "INR", "PROTIME" in the last 168 hours. Cardiac Enzymes: Recent Labs  Lab 05/28/23 0336  CKTOTAL 19*   BNP (last 3 results) No results for input(s): "PROBNP" in the last 8760 hours. HbA1C: No results for input(s): "HGBA1C" in the last 72 hours. CBG: Recent Labs  Lab 05/30/23 1145 05/30/23 1638 05/30/23 2233 05/31/23 0759 05/31/23 1046  GLUCAP 125* 168* 147* 148* 139*   Lipid Profile: No results for input(s): "CHOL", "HDL", "LDLCALC", "TRIG", "CHOLHDL", "LDLDIRECT" in the last 72 hours. Thyroid Function Tests: No results for input(s): "TSH", "T4TOTAL", "FREET4", "T3FREE", "THYROIDAB" in the last 72 hours. Anemia Panel: No results for input(s): "VITAMINB12", "FOLATE", "FERRITIN", "TIBC", "IRON", "RETICCTPCT" in the last 72 hours. Sepsis Labs: Recent Labs  Lab 05/24/23 1712 05/24/23 1910  LATICACIDVEN 1.0 1.7    Recent Results (from the past 240 hour(s))  Blood culture (routine x 2)     Status: None   Collection Time: 05/24/23  5:15 PM   Specimen: BLOOD  Result Value Ref Range Status   Specimen Description BLOOD LEFT ANTECUBITAL  Final   Special Requests   Final    BOTTLES DRAWN AEROBIC ONLY Blood Culture adequate volume   Culture   Final    NO GROWTH 5 DAYS Performed at [facility], [address], [place], [place] [address]    Report Status 05/29/2023 FINAL  Final  Blood culture (routine x 2)      Status: None   Collection Time: 05/24/23  6:57 PM   Specimen: BLOOD RIGHT HAND  Result Value Ref Range Status   Specimen Description BLOOD RIGHT HAND  Final   Special Requests   Final    BOTTLES DRAWN AEROBIC ONLY Blood Culture results may not be optimal due to an inadequate volume of blood received in culture bottles   Culture   Final    NO GROWTH 5 DAYS Performed at [facility], [address], [place], [place] [address]    Report Status 05/29/2023 FINAL  Final  Aerobic/Anaerobic Culture w Gram Stain (surgical/deep wound)     Status: None (Preliminary result)   Collection Time: 05/26/23  2:24 PM   Specimen: Abscess  Result Value Ref Range Status   Specimen Description ABSCESS LEFT ARM  Final   Special Requests NONE  Final   Gram Stain   Final    ABUNDANT WBC PRESENT, PREDOMINANTLY PMN NO ORGANISMS SEEN    Culture   Final    NO GROWTH 4 DAYS NO ANAEROBES ISOLATED; CULTURE IN PROGRESS FOR 5 DAYS Performed at [facility], [address] [place], [place] [address]    Report Status PENDING  Incomplete  Aerobic/Anaerobic Culture w Gram Stain (surgical/deep wound)     Status: None (Preliminary result)   Collection Time: 05/26/23  3:14 PM   Specimen: Soft Tissue, Other  Result Value Ref Range Status   Specimen Description TISSUE LEFT ARM  Final   Special Requests FROM LEFT ARM ABSCESS  Final   Gram Stain   Final    ABUNDANT WBC PRESENT, PREDOMINANTLY PMN FEW GRAM POSITIVE COCCI IN PAIRS IN CHAINS Gram Stain Report Called to,Read Back By and Verified With: RN Leslye Peer [phone] @ [phone] FH    Culture   Final    NO  GROWTH 4 DAYS NO ANAEROBES ISOLATED; CULTURE IN PROGRESS FOR 5 DAYS Performed at [facility], [address], [place], [place] [address]    Report Status PENDING  Incomplete  Acid Fast Smear (AFB)     Status: None   Collection Time: 05/26/23  3:14 PM   Specimen: Soft Tissue, Other  Result Value Ref Range Status   AFB Specimen Processing Comment   Final    Comment: Tissue Grinding and Digestion/Decontamination   Acid Fast Smear Negative  Final    Comment: (NOTE) Performed At: [facility] [address] [place], [place] [id number] Jolene Schimke MD XB:[id number]    Source (AFB) TISSUE  Final    Comment: LEFT ABSCESS FROM ARM Performed at [facility], [address] [place], [place] [address]   MRSA Next Gen by PCR, Nasal     Status: None   Collection Time: 05/30/23  9:03 PM   Specimen: Nasal Mucosa; Nasal Swab  Result Value Ref Range Status   MRSA by PCR Next Gen NOT DETECTED NOT DETECTED Final    Comment: (NOTE) The GeneXpert MRSA Assay (FDA approved for NASAL specimens only), is one component of a comprehensive MRSA colonization surveillance program. It is not intended to diagnose MRSA infection nor to guide or monitor treatment for MRSA infections. Test performance is not FDA approved in patients less than 60 years old. Performed at [facility], [address], [place], [place] [address]          Radiology Studies: No results found.         LOS: 6 days   Time spent= 35 mins    Miguel Rota, MD [facility]  If 7PM-7AM, please contact night-coverage  05/31/2023, 11:27 AM

## 2023-05-31 NOTE — Brief Op Note (Signed)
05/31/2023  4:12 PM  PATIENT:  Justin Snow  67 y.o. male  PROCEDURE:  Procedure(s): IRRIGATION AND DEBRIDEMENT LEFT ELBOW, WOUND CLOSURE (Left)  SURGEON:  Surgeons and Role:    Myrene Galas, MD - Primary  [phone]

## 2023-05-31 NOTE — Op Note (Signed)
NAMECONNELL, MAGNESS MEDICAL RECORD NO: [id number] ACCOUNT NO: [id number] DATE OF BIRTH: 05-03-1956 FACILITY: [facility] LOCATION: [facility] PHYSICIAN: Doralee Albino. Carola Frost, MD  Operative Report   DATE OF PROCEDURE: 05/31/2023  PREOPERATIVE DIAGNOSIS:  Left forearm, left elbow, left arm abscess.  POSTOPERATIVE DIAGNOSIS:  Left forearm, left elbow, left arm abscess.  PROCEDURES: 1.  Repeat incision and drainage of left forearm abscess. 2.  Repeat incision and drainage of the left humerus abscess. 3.  Debridement of the skin, subcutaneous tissue and fascia. 4.  Retention suture closure of left arm, 32 cm. 5.  Application of long arm splint.  SURGEON:  Doralee Albino. Carola Frost, MD.  ASSISTANT:  Montez Morita, PA-C.  ANESTHESIA:  General.  COMPLICATIONS:  None.  SPECIMENS:  None.  DISPOSITION:  To PACU.  CONDITION:  Stable.  BRIEF SUMMARY OF INDICATIONS FOR PROCEDURE:  The patient is a 67 year old male who underwent evacuation of an enormous left upper extremity abscess and bursectomy 5 days ago.  He has been undergoing instillation wound VAC therapy, since that time he  presents for possible closure with a repeat incision and drainage of these areas and debridement as indicated.  I discussed with him preoperatively the risks and benefits and he did provide consent to proceed.  BRIEF SUMMARY OF PROCEDURE:  The patient was taken to the operating room where general anesthesia was induced.  The left upper extremity was prepped and draped in the usual sterile fashion with removal of the wound VAC dressing, but retention of the deep  sponges.  After draping, a timeout was held.  I removed the deep sponges and passed this off the table along with the instrument used to retrieve them.  The forearm abscess was then thoroughly irrigated and explored down to the wrist.  It had closed in  some from at and/or distal to the wrist to being 3 cm proximal to the wrist.  The attention was then turned to the abscess  within the arm.  With the help of my assistant this area was irrigated thoroughly and explored.  It too had decreased in size.   There was healthy granulation tissue well over 95% of the area.  There was, however, an area of soupy purulent fibrinous material along the lateral edge in the area of the elbow bursectomy, but on the soft tissue flap rather than directly over the bone.   This was debrided sharply with Metzenbaum scissors.  It was a 3 cm x 2 cm x 1 cm depth.  This area was irrigated once more and then fresh drape to the field applied and contaminated gloves removed.  A stepwise closure was then performed using a 0 nylon  far-near-near-far retention sutures, placing at least 10 of these to get apposition and then going back and placing multiple 2-0 nylon far-near-near-far retention sutures and a few interspersed PDS sutures between them.  There were gaps left in multiple areas  between the sutures to allow for egress of any fluid along this 32 cm wound.  There was extremely healthy granulation tissue within the bed.  This included both the deep and superficial sides of the forearm and upper arm abscesses.  A sterile, gently compressive  circumferential dressing was applied and then a splint to prevent tension injury on the elbow.  Following application of the splint the patient was awakened from anesthesia and transported to PACU in stable condition.  PROGNOSIS:  The patient is at high risk for wound complications if he is noncompliant with restrictions  as his antibiotic treatment continues, we would expect the swelling and tension to continue to abate and should go on to uneventful union given the  excellent granulation tissue present today.  Sugar control will be paramount as hyperglycemia could result in recurrence or persistence of infection.  We anticipate removal of the sutures at 3 weeks.   PUS D: 05/31/2023 4:12:35 pm T: 05/31/2023 8:02:00 pm  JOB: [id number]/ [id number]

## 2023-05-31 NOTE — Transfer of Care (Signed)
Immediate Anesthesia Transfer of Care Note  Patient: Justin Snow  Procedure(s) Performed: IRRIGATION AND DEBRIDEMENT LEFT ELBOW, WOUND CLOSURE (Left: Elbow)  Patient Location: PACU  Anesthesia Type:General  Level of Consciousness: drowsy  Airway & Oxygen Therapy: Patient Spontanous Breathing and Patient connected to face mask oxygen  Post-op Assessment: Report given to RN and Post -op Vital signs reviewed and stable  Post vital signs: Reviewed and stable  Last Vitals:  Vitals Value Taken Time  BP 130/79 05/31/23 1255  Temp    Pulse 60 05/31/23 1258  Resp 12 05/31/23 1258  SpO2 99 % 05/31/23 1258  Vitals shown include unfiled device data.  Last Pain:  Vitals:   05/31/23 1040  TempSrc:   PainSc: 0-No pain      Patients Stated Pain Goal: 0 (05/27/23 2115)  Complications: No notable events documented.

## 2023-05-31 NOTE — Anesthesia Procedure Notes (Signed)
Procedure Name: Intubation Date/Time: 05/31/2023 11:44 AM  Performed by: Waynard Edwards, CRNAPre-anesthesia Checklist: Patient identified, Emergency Drugs available, Suction available and Patient being monitored Patient Re-evaluated:Patient Re-evaluated prior to induction Oxygen Delivery Method: Circle system utilized Preoxygenation: Pre-oxygenation with 100% oxygen Induction Type: IV induction Ventilation: Mask ventilation without difficulty and Oral airway inserted - appropriate to patient size Laryngoscope Size: Hyacinth Meeker and 2 Grade View: Grade I Tube type: Oral Tube size: 7.5 mm Number of attempts: 1 Airway Equipment and Method: Stylet and Oral airway Placement Confirmation: ETT inserted through vocal cords under direct vision, positive ETCO2 and breath sounds checked- equal and bilateral Secured at: 23 cm Tube secured with: Tape Dental Injury: Teeth and Oropharynx as per pre-operative assessment

## 2023-05-31 NOTE — Plan of Care (Signed)

## 2023-05-31 NOTE — Anesthesia Postprocedure Evaluation (Signed)
Anesthesia Post Note  Patient: Justin Snow  Procedure(s) Performed: IRRIGATION AND DEBRIDEMENT LEFT ELBOW, WOUND CLOSURE (Left: Elbow)     Patient location during evaluation: PACU Anesthesia Type: General Level of consciousness: awake Pain management: pain level controlled Vital Signs Assessment: post-procedure vital signs reviewed and stable Respiratory status: spontaneous breathing, nonlabored ventilation and respiratory function stable Cardiovascular status: blood pressure returned to baseline and stable Postop Assessment: no apparent nausea or vomiting Anesthetic complications: no   No notable events documented.  Last Vitals:  Vitals:   05/31/23 1325 05/31/23 1353  BP: 96/73 130/64  Pulse: 74 67  Resp: 16   Temp: (!) 36.4 C   SpO2: 99% 99%    Last Pain:  Vitals:   05/31/23 1040  TempSrc:   PainSc: 0-No pain                 Linton Rump

## 2023-05-31 NOTE — Plan of Care (Signed)
  Problem: Health Behavior/Discharge Planning: Goal: Ability to manage health-related needs will improve Outcome: Progressing   Problem: Clinical Measurements: Goal: Will remain free from infection Outcome: Progressing   Problem: Activity: Goal: Risk for activity intolerance will decrease Outcome: Progressing   Problem: Elimination: Goal: Will not experience complications related to bowel motility Outcome: Progressing Goal: Will not experience complications related to urinary retention Outcome: Progressing   Problem: Safety: Goal: Ability to remain free from injury will improve Outcome: Progressing   Problem: Skin Integrity: Goal: Risk for impaired skin integrity will decrease Outcome: Progressing

## 2023-05-31 NOTE — Progress Notes (Signed)
Minimally responsive but occasionally answers questions. No change over night.  The risks and benefits of surgery for repeat debridement and possible closure of left arm wounds were discussed with the patient, including the possibility of persistent or recurrent infection, nerve injury, vessel injury, wound breakdown, arthritis, symptomatic hardware, DVT/ PE, loss of motion, and need for further surgery among others.  These risks were acknowledged and consent provided to proceed.  Myrene Galas, MD [facility], [facility] [phone]

## 2023-05-31 NOTE — Progress Notes (Signed)
[facility]  Date of Admission:  05/24/2023      Total days of antibiotics 8  Daptomycin 8/27 >> c   Cefepime 8/27 >> c            ASSESSMENT: Justin Snow is a 67 y.o. male admitted with:   Cellulitis of Left Elbow / Upper Extremity -  -initial debridement on 8/27 and going again today with Dr. Carola Frost (bursectomy and drainage of 2 large abscesses in left arm with wound vac application; olecranon bursa c/w chronic abscess); cultures intra-op without growth but gram positive cocci in pairs/chains described.  -continue daptomycin + cefepime  -follow for OR findings 9/3  Incarceration -  -current d/t parole violation 8/13, guards present. 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    PLAN: Continue daptomycin + cefepime  Follow for OR findings 9/3  Presume he will need 3-4 weeks of treatment for septic bursitis. May be able to convert to oral abx eventually pending further OR findings    Principal Problem:   Left arm cellulitis    [MAR Hold] amLODipine  10 mg Oral Daily   [MAR Hold] atorvastatin  10 mg Oral QHS   [MAR Hold] carvedilol  6.25 mg Oral BID WC   chlorhexidine  15 mL Mouth/Throat Once   Or   mouth rinse  15 mL Mouth Rinse Once   [MAR Hold] Chlorhexidine Gluconate Cloth  6 each Topical Q0600   [MAR Hold] DULoxetine  30 mg Oral BID   [MAR Hold] enoxaparin (LOVENOX) injection  40 mg Subcutaneous Daily   [MAR Hold] insulin aspart  0-5 Units Subcutaneous QHS   [MAR Hold] insulin aspart  0-9 Units Subcutaneous TID WC   [MAR Hold] insulin glargine-yfgn  5 Units Subcutaneous Daily   [MAR Hold] lisinopril  40 mg Oral Daily   [MAR Hold] mometasone-formoterol  2 puff Inhalation BID   [MAR Hold] pantoprazole  40 mg Oral Daily   [MAR Hold] sodium chloride flush  3 mL  Intravenous Q12H    SUBJECTIVE: Not much to say this morning - nods no when asked if he has any pain.   -ER/hospital stay on 8/18 with rapid progression of erythema / edema to LUE (2x as large as contralateral); fevers present. Ortho followed. CT LUE negative for abscess; consistent with severe cellulitis; small joint effusion but adequate ROM in joint. No surgery needed with improvement on IV vanco / unasyn >> d/c'd from hospital 8/20 with oral augmentin + doxy BID.  -Back on 8/27 after ongoing swelling and concern for relapsing infection - Repeat CT scan now shows a large abscess and orthopedic surgery was reconsulted;   Review of Systems: Review of Systems  Unable to perform ROS: Medical condition    No Known Allergies  OBJECTIVE: Vitals:   05/30/23 1356 05/30/23 2006 05/31/23 0802 05/31/23 1038  BP: (!) 141/75 137/73 (!) 119/58 125/78  Pulse: 74 72 70 77  Resp: 18 18 16 18   Temp: (!) 97.5 F (36.4 C)  (!) 97.5 F (36.4 C) 97.9 F (36.6 C)  TempSrc: Axillary  Oral Oral  SpO2: 98% 97% 97% 98%  Weight:    68 kg  Height:    5' 6.5" (1.689 m)   Body mass index is 23.85 kg/m.  Physical Exam  Vitals reviewed.  Constitutional:      Appearance: Normal appearance.  Cardiovascular:     Rate and Rhythm: Normal rate and regular rhythm.  Neurological:     Mental Status: He is alert.     Lab Results Lab Results  Component Value Date   WBC 13.2 (H) 05/31/2023   HGB 10.3 (L) 05/31/2023   HCT 30.6 (L) 05/31/2023   MCV 85.7 05/31/2023   PLT 248 05/31/2023    Lab Results  Component Value Date   CREATININE 1.55 (H) 05/31/2023   BUN 45 (H) 05/31/2023   NA 131 (L) 05/31/2023   K 3.7 05/31/2023   CL 102 05/31/2023   CO2 22 05/31/2023    Lab Results  Component Value Date   ALT 14 05/24/2023   AST 17 05/24/2023   ALKPHOS 114 05/24/2023   BILITOT 0.8 05/24/2023     Microbiology: Recent Results (from the past 240 hour(s))  Blood culture (routine x 2)     Status: None    Collection Time: 05/24/23  5:15 PM   Specimen: BLOOD  Result Value Ref Range Status   Specimen Description BLOOD LEFT ANTECUBITAL  Final   Special Requests   Final    BOTTLES DRAWN AEROBIC ONLY Blood Culture adequate volume   Culture   Final    NO GROWTH 5 DAYS Performed at [facility], [address], [place], [place] [address]    Report Status 05/29/2023 FINAL  Final  Blood culture (routine x 2)     Status: None   Collection Time: 05/24/23  6:57 PM   Specimen: BLOOD RIGHT HAND  Result Value Ref Range Status   Specimen Description BLOOD RIGHT HAND  Final   Special Requests   Final    BOTTLES DRAWN AEROBIC ONLY Blood Culture results may not be optimal due to an inadequate volume of blood received in culture bottles   Culture   Final    NO GROWTH 5 DAYS Performed at [facility], [address] [place], [place] [address]    Report Status 05/29/2023 FINAL  Final  Aerobic/Anaerobic Culture w Gram Stain (surgical/deep wound)     Status: None (Preliminary result)   Collection Time: 05/26/23  2:24 PM   Specimen: Abscess  Result Value Ref Range Status   Specimen Description ABSCESS LEFT ARM  Final   Special Requests NONE  Final   Gram Stain   Final    ABUNDANT WBC PRESENT, PREDOMINANTLY PMN NO ORGANISMS SEEN    Culture   Final    NO GROWTH 4 DAYS NO ANAEROBES ISOLATED; CULTURE IN PROGRESS FOR 5 DAYS Performed at [facility], [address], [place], [place] [address]    Report Status PENDING  Incomplete  Aerobic/Anaerobic Culture w Gram Stain (surgical/deep wound)     Status: None (Preliminary result)   Collection Time: 05/26/23  3:14 PM   Specimen: Soft Tissue, Other  Result Value Ref Range Status   Specimen Description TISSUE LEFT ARM  Final   Special Requests FROM LEFT ARM ABSCESS  Final   Gram Stain   Final    ABUNDANT WBC PRESENT, PREDOMINANTLY PMN FEW GRAM POSITIVE COCCI IN PAIRS IN CHAINS Gram Stain Report Called to,Read Back By and Verified  With: RN Leslye Peer [phone] @ [phone] FH    Culture   Final    NO GROWTH 4 DAYS NO ANAEROBES ISOLATED; CULTURE IN PROGRESS FOR 5 DAYS Performed at [facility], [address] [place], [place] [address]    Report Status PENDING  Incomplete  Acid Fast Smear (AFB)     Status: None   Collection Time: 05/26/23  3:14 PM   Specimen: Soft Tissue, Other  Result Value Ref Range Status   AFB Specimen Processing Comment  Final    Comment: Tissue Grinding and Digestion/Decontamination   Acid Fast Smear Negative  Final    Comment: (NOTE) Performed At: [facility] [address] [place], [place] [id number] Jolene Schimke MD AO:[id number]    Source (AFB) TISSUE  Final    Comment: LEFT ABSCESS FROM ARM Performed at [facility], [address] [place], [place] [address]   MRSA Next Gen by PCR, Nasal     Status: None   Collection Time: 05/30/23  9:03 PM   Specimen: Nasal Mucosa; Nasal Swab  Result Value Ref Range Status   MRSA by PCR Next Gen NOT DETECTED NOT DETECTED Final    Comment: (NOTE) The GeneXpert MRSA Assay (FDA approved for NASAL specimens only), is one component of a comprehensive MRSA colonization surveillance program. It is not intended to diagnose MRSA infection nor to guide or monitor treatment for MRSA infections. Test performance is not FDA approved in patients less than 48 years old. Performed at [facility], [address], [place], [place] [address]     Rexene Alberts, MSN, NP-C [facility]  [email or web address] Pager: [phone] Office: [phone] [facility] Main Line: [phone] *Secure Chat Communication Welcome

## 2023-06-01 ENCOUNTER — Encounter (HOSPITAL_COMMUNITY): Payer: Self-pay | Admitting: Orthopedic Surgery

## 2023-06-01 DIAGNOSIS — L03114 Cellulitis of left upper limb: Secondary | ICD-10-CM | POA: Diagnosis not present

## 2023-06-01 LAB — BASIC METABOLIC PANEL
Anion gap: 10 (ref 5–15)
BUN: 45 mg/dL — ABNORMAL HIGH (ref 8–23)
CO2: 21 mmol/L — ABNORMAL LOW (ref 22–32)
Calcium: 8.4 mg/dL — ABNORMAL LOW (ref 8.9–10.3)
Chloride: 103 mmol/L (ref 98–111)
Creatinine, Ser: 1.38 mg/dL — ABNORMAL HIGH (ref 0.61–1.24)
GFR, Estimated: 55 mL/min — ABNORMAL LOW (ref >60)
Glucose, Bld: 192 mg/dL — ABNORMAL HIGH (ref 70–99)
Potassium: 4.9 mmol/L (ref 3.5–5.1)
Sodium: 134 mmol/L — ABNORMAL LOW (ref 135–145)

## 2023-06-01 LAB — MAGNESIUM: Magnesium: 2 mg/dL (ref 1.7–2.4)

## 2023-06-01 LAB — CBC
HCT: 29.6 % — ABNORMAL LOW (ref 39.0–52.0)
Hemoglobin: 9.8 g/dL — ABNORMAL LOW (ref 13.0–17.0)
MCH: 28 pg (ref 26.0–34.0)
MCHC: 33.1 g/dL (ref 30.0–36.0)
MCV: 84.6 fL (ref 80.0–100.0)
Platelets: 270 10*3/uL (ref 150–400)
RBC: 3.5 MIL/uL — ABNORMAL LOW (ref 4.22–5.81)
RDW: 13.6 % (ref 11.5–15.5)
WBC: 12.2 10*3/uL — ABNORMAL HIGH (ref 4.0–10.5)
nRBC: 0 % (ref 0.0–0.2)

## 2023-06-01 LAB — GLUCOSE, CAPILLARY
Glucose-Capillary: 164 mg/dL — ABNORMAL HIGH (ref 70–99)
Glucose-Capillary: 170 mg/dL — ABNORMAL HIGH (ref 70–99)
Glucose-Capillary: 207 mg/dL — ABNORMAL HIGH (ref 70–99)
Glucose-Capillary: 269 mg/dL — ABNORMAL HIGH (ref 70–99)

## 2023-06-01 MED ORDER — HALOPERIDOL LACTATE 5 MG/ML IJ SOLN
2.5000 mg | Freq: Four times a day (QID) | INTRAMUSCULAR | Status: DC | PRN
  Administered 2023-06-01: 2.5 mg via INTRAVENOUS
  Filled 2023-06-01: qty 1

## 2023-06-01 MED ORDER — OLANZAPINE 10 MG IM SOLR
5.0000 mg | Freq: Once | INTRAMUSCULAR | Status: AC
  Administered 2023-06-01: 5 mg via INTRAMUSCULAR
  Filled 2023-06-01 (×2): qty 10

## 2023-06-01 NOTE — Progress Notes (Addendum)
PROGRESS NOTE    Justin Snow  ATF:[id number] DOB: [birth year] DOA: 05/24/2023 PCP: Pcp, No     Brief Narrative:  67 year old incarcerated male with history of asthma, DM 2, HTN, CKD stage III A, CVA presents to the ED with complaints of arm swelling.  [facility] after being treated for cellulitis of the left forearm.  At that time patient completed course of vancomycin and Unasyn and eventually transition to Augmentin and doxycycline.  Returns back to the hospital due to worsening pain and swelling.  X-ray of the elbow shows diffuse edema.  CT performed eventually showed worsening cellulitis with larger abscess therefore orthopedic consulted who plans on the I&D. Underwent extensive I&D by orthopedic on 8/29 with application of wound VAC.  ID Following.  Status post repeat I&D by orthopedic on 9/3.  Recommending nonweightbearing of the left upper extremity, splint, okay to move fingers and dressing to remain in place until 3-week follow-up.  No further surgical intervention planned.     Assessment & Plan:  Principal Problem:   Left arm cellulitis   Left upper extremity cellulitis with progressing abscess - Failed outpatient antibiotic, CT upper extremity showed worsening of infection.  Underwent extensive I&D by orthopedic on 8/29 with application of wound VAC.  Status post repeat I&D on 9/3.  Ortho recommending nonweightbearing of left upper extremity, splint, okay to move fingers and dressing to remain in place for 3 weeks. - Will discuss with ID antibiotic plan and make discharge disposition plans  Again today discussed case with Dr Tasia Catchings, from [facility] [place], [place]. They will accept the patient once cleared by ID pending culture sensitivity/susceptibility at this time.    History of asthma - Bronchodilators as needed   Diabetes mellitus type 2 - Diabetic diet.  Sliding scale and Accu-Cheks. -  Adjust insulin as necessary.   CKD stage IIIa -Creatinine around baseline of 1.5   Essential hypertension -Norvasc, Coreg, lisinopril.  IV as needed   History of CVA -MRI was positive for CVA in July 2024.  On statin   Depression -Cymbalta   DVT prophylaxis: enoxaparin (LOVENOX) injection 40 mg Start: 05/25/23 1000 SCDs Start: 05/24/23 2247 Code Status: Full code Family Communication:   DC plan once cleraed by ID                 Subjective: No complaints.   I spoke with Dr. Tasia Catchings from [facility] who is willing to accept the patient once we have infectious disease recommendations in terms of antibiotics.  Examination:  General exam: Appears calm and comfortable  Respiratory system: Clear to auscultation. Respiratory effort normal. Cardiovascular system: S1 & S2 heard, RRR. No JVD, murmurs, rubs, gallops or clicks. No pedal edema. Gastrointestinal system: Abdomen is nondistended, soft and nontender. No organomegaly or masses felt. Normal bowel sounds heard. Central nervous system: Alert and oriented. No focal neurological deficits. Extremities: Symmetric 5 x 5 power. Skin: Left upper extremity dressing and splint in place Psychiatry: Judgement and insight appear normal. Mood & affect appropriate.       Diet Orders (From admission, onward)     Start     Ordered   05/31/23 1351  Diet regular Room service appropriate? Yes; Fluid consistency: Thin  Diet effective now       Question Answer Comment  Room service appropriate? Yes   Fluid consistency: Thin      05/31/23 1350  Objective: Vitals:   05/31/23 1325 05/31/23 1353 05/31/23 1938 06/01/23 0715  BP: 96/73 130/64 (!) 157/96 130/67  Pulse: 74 67 73 70  Resp: 16  18 17   Temp: (!) 97.5 F (36.4 C)  98 F (36.7 C) 98.4 F (36.9 C)  TempSrc:   Oral Oral  SpO2: 99% 99% 100% 97%  Weight:      Height:        Intake/Output Summary (Last 24 hours) at 06/01/2023 1350 Last data filed at  06/01/2023 0715 Gross per 24 hour  Intake 458.99 ml  Output 400 ml  Net 58.99 ml   Filed Weights   05/24/23 1928 05/26/23 1321 05/31/23 1038  Weight: 99.8 kg 99.8 kg 68 kg    Scheduled Meds:  amLODipine  10 mg Oral Daily   atorvastatin  10 mg Oral QHS   carvedilol  6.25 mg Oral BID WC   Chlorhexidine Gluconate Cloth  6 each Topical Q0600   DULoxetine  30 mg Oral BID   enoxaparin (LOVENOX) injection  40 mg Subcutaneous Daily   insulin aspart  0-5 Units Subcutaneous QHS   insulin aspart  0-9 Units Subcutaneous TID WC   insulin glargine-yfgn  5 Units Subcutaneous Daily   lisinopril  40 mg Oral Daily   mometasone-formoterol  2 puff Inhalation BID   pantoprazole  40 mg Oral Daily   sodium chloride flush  3 mL Intravenous Q12H   Continuous Infusions:  ceFEPime (MAXIPIME) IV 2 g (06/01/23 0818)   DAPTOmycin (CUBICIN) 650 mg in sodium chloride 0.9 % IVPB 650 mg (05/31/23 1719)    Nutritional status     Body mass index is 23.85 kg/m.  Data Reviewed:   CBC: Recent Labs  Lab 05/28/23 0336 05/29/23 0331 05/30/23 0312 05/31/23 0444 06/01/23 0749  WBC 17.0* 13.0* 10.9* 13.2* 12.2*  HGB 9.7* 9.6* 10.5* 10.3* 9.8*  HCT 28.8* 28.7* 31.5* 30.6* 29.6*  MCV 83.7 83.7 85.6 85.7 84.6  PLT 263 237 255 248 270   Basic Metabolic Panel: Recent Labs  Lab 05/26/23 0403 05/27/23 0441 05/28/23 0336 05/29/23 0331 05/30/23 0312 05/31/23 0444 06/01/23 0749  NA 133*   < > 132* 130* 132* 131* 134*  K 3.4*   < > 4.0 4.1 4.0 3.7 4.9  CL 104   < > 105 100 102 102 103  CO2 21*   < > 20* 23 22 22  21*  GLUCOSE 140*   < > 72 139* 118* 150* 192*  BUN 25*   < > 30* 34* 34* 45* 45*  CREATININE 1.45*   < > 1.53* 1.38* 1.41* 1.55* 1.38*  CALCIUM 8.0*   < > 7.8* 7.7* 8.3* 8.3* 8.4*  MG 1.6*   < > 2.4 2.1 1.9 1.9 2.0  PHOS 3.8  --   --   --   --   --   --    < > = values in this interval not displayed.   GFR: Estimated Creatinine Clearance: 46.4 mL/min (A) (by C-G formula based on SCr of  1.38 mg/dL (H)). Liver Function Tests: No results for input(s): "AST", "ALT", "ALKPHOS", "BILITOT", "PROT", "ALBUMIN" in the last 168 hours. No results for input(s): "LIPASE", "AMYLASE" in the last 168 hours. No results for input(s): "AMMONIA" in the last 168 hours. Coagulation Profile: No results for input(s): "INR", "PROTIME" in the last 168 hours. Cardiac Enzymes: Recent Labs  Lab 05/28/23 0336  CKTOTAL 19*   BNP (last 3 results) No results for input(s): "PROBNP" in  the last 8760 hours. HbA1C: No results for input(s): "HGBA1C" in the last 72 hours. CBG: Recent Labs  Lab 05/31/23 1256 05/31/23 1644 05/31/23 2143 06/01/23 0731 06/01/23 1125  GLUCAP 130* 175* 206* 164* 170*   Lipid Profile: No results for input(s): "CHOL", "HDL", "LDLCALC", "TRIG", "CHOLHDL", "LDLDIRECT" in the last 72 hours. Thyroid Function Tests: No results for input(s): "TSH", "T4TOTAL", "FREET4", "T3FREE", "THYROIDAB" in the last 72 hours. Anemia Panel: No results for input(s): "VITAMINB12", "FOLATE", "FERRITIN", "TIBC", "IRON", "RETICCTPCT" in the last 72 hours. Sepsis Labs: No results for input(s): "PROCALCITON", "LATICACIDVEN" in the last 168 hours.  Recent Results (from the past 240 hour(s))  Blood culture (routine x 2)     Status: None   Collection Time: 05/24/23  5:15 PM   Specimen: BLOOD  Result Value Ref Range Status   Specimen Description BLOOD LEFT ANTECUBITAL  Final   Special Requests   Final    BOTTLES DRAWN AEROBIC ONLY Blood Culture adequate volume   Culture   Final    NO GROWTH 5 DAYS Performed at [facility], [address], [place], [place] [address]    Report Status 05/29/2023 FINAL  Final  Blood culture (routine x 2)     Status: None   Collection Time: 05/24/23  6:57 PM   Specimen: BLOOD RIGHT HAND  Result Value Ref Range Status   Specimen Description BLOOD RIGHT HAND  Final   Special Requests   Final    BOTTLES DRAWN AEROBIC ONLY Blood Culture results may not be  optimal due to an inadequate volume of blood received in culture bottles   Culture   Final    NO GROWTH 5 DAYS Performed at [facility], [address], [place], [place] [address]    Report Status 05/29/2023 FINAL  Final  Aerobic/Anaerobic Culture w Gram Stain (surgical/deep wound)     Status: None   Collection Time: 05/26/23  2:24 PM   Specimen: Abscess  Result Value Ref Range Status   Specimen Description ABSCESS LEFT ARM  Final   Special Requests NONE  Final   Gram Stain   Final    ABUNDANT WBC PRESENT, PREDOMINANTLY PMN NO ORGANISMS SEEN    Culture   Final    No growth aerobically or anaerobically. Performed at [facility], [address] [place], [place] [address]    Report Status 05/31/2023 FINAL  Final  Aerobic/Anaerobic Culture w Gram Stain (surgical/deep wound)     Status: None (Preliminary result)   Collection Time: 05/26/23  3:14 PM   Specimen: Soft Tissue, Other  Result Value Ref Range Status   Specimen Description TISSUE LEFT ARM  Final   Special Requests FROM LEFT ARM ABSCESS  Final   Gram Stain   Final    ABUNDANT WBC PRESENT, PREDOMINANTLY PMN FEW GRAM POSITIVE COCCI IN PAIRS IN CHAINS Gram Stain Report Called to,Read Back By and Verified With: Donny Pique [phone] @ [phone] FH Performed at [facility], [address], [place], [place] [address]    Culture   Final    RARE STAPHYLOCOCCUS EPIDERMIDIS SUSCEPTIBILITIES TO FOLLOW NO ANAEROBES ISOLATED; CULTURE IN PROGRESS FOR 5 DAYS    Report Status PENDING  Incomplete  Acid Fast Smear (AFB)     Status: None   Collection Time: 05/26/23  3:14 PM   Specimen: Soft Tissue, Other  Result Value Ref Range Status   AFB Specimen Processing Comment  Final    Comment: Tissue  Grinding and Digestion/Decontamination   Acid Fast Smear Negative  Final    Comment: (NOTE) Performed At: [facility] [address] [place], [place] [id number] Jolene Schimke MD WU:[id number]    Source (AFB) TISSUE   Final    Comment: LEFT ABSCESS FROM ARM Performed at [facility], [address] [place], [place] [address]   MRSA Next Gen by PCR, Nasal     Status: None   Collection Time: 05/30/23  9:03 PM   Specimen: Nasal Mucosa; Nasal Swab  Result Value Ref Range Status   MRSA by PCR Next Gen NOT DETECTED NOT DETECTED Final    Comment: (NOTE) The GeneXpert MRSA Assay (FDA approved for NASAL specimens only), is one component of a comprehensive MRSA colonization surveillance program. It is not intended to diagnose MRSA infection nor to guide or monitor treatment for MRSA infections. Test performance is not FDA approved in patients less than 50 years old. Performed at [facility], [address], [place], [place] [address]          Radiology Studies: No results found.         LOS: 7 days   Time spent= 35 mins    Miguel Rota, MD [facility]  If 7PM-7AM, please contact night-coverage  06/01/2023, 1:50 PM

## 2023-06-01 NOTE — Progress Notes (Signed)
Orthopaedic Trauma Service Progress Note  Patient ID: Justin Snow MRN: [id number] DOB/AGE: [birth year] 69 y.o.  Subjective:  Ortho issues stable Wound closure completed yesterday. Soft tissue with excellent granulation tissue   ROS As above  Objective:   VITALS:   Vitals:   05/31/23 1325 05/31/23 1353 05/31/23 1938 06/01/23 0715  BP: 96/73 130/64 (!) 157/96 130/67  Pulse: 74 67 73 70  Resp: 16  18 17   Temp: (!) 97.5 F (36.4 C)  98 F (36.7 C) 98.4 F (36.9 C)  TempSrc:   Oral Oral  SpO2: 99% 99% 100% 97%  Weight:      Height:        Estimated body mass index is 23.85 kg/m as calculated from the following:   Height as of this encounter: 5' 6.5" (1.689 m).   Weight as of this encounter: 68 kg.   Intake/Output      09/03 0701 09/04 0700 09/04 0701 09/05 0700   P.O. 120    I.V. (mL/kg) 703 (10.3)    IV Piggyback 216    Total Intake(mL/kg) 1039 (15.3)    Urine (mL/kg/hr)     Drains     Blood 30    Total Output 30    Net +1009           LABS  Results for orders placed or performed during the hospital encounter of 05/24/23 (from the past 24 hour(s))  Glucose, capillary     Status: Abnormal   Collection Time: 05/31/23 10:46 AM  Result Value Ref Range   Glucose-Capillary 139 (H) 70 - 99 mg/dL  Glucose, capillary     Status: Abnormal   Collection Time: 05/31/23 12:56 PM  Result Value Ref Range   Glucose-Capillary 130 (H) 70 - 99 mg/dL  Glucose, capillary     Status: Abnormal   Collection Time: 05/31/23  4:44 PM  Result Value Ref Range   Glucose-Capillary 175 (H) 70 - 99 mg/dL  Glucose, capillary     Status: Abnormal   Collection Time: 05/31/23  9:43 PM  Result Value Ref Range   Glucose-Capillary 206 (H) 70 - 99 mg/dL  Glucose, capillary     Status: Abnormal   Collection Time: 06/01/23  7:31 AM  Result Value Ref Range   Glucose-Capillary 164 (H) 70 - 99 mg/dL      PHYSICAL EXAM:   Gen: awake, sitting up but not really responding to questions s Lungs: unlabored Ext:       Left Upper Extremity              Splint fitting well but already a bit compromised from excessive pt movement. Will have side strut added              Ext warm              Mod edema to hand              Motor and sensory functions grossly intact               Good perfusion distally   Assessment/Plan: 1 Day Post-Op   Principal Problem:   Left arm cellulitis   Anti-infectives (From admission, onward)    Start     Dose/Rate Route Frequency Ordered Stop   05/27/23 1600  DAPTOmycin (  CUBICIN) 650 mg in sodium chloride 0.9 % IVPB        8 mg/kg  79.6 kg (Adjusted) 126 mL/hr over 30 Minutes Intravenous Daily 05/27/23 1512     05/27/23 0600  ceFAZolin (ANCEF) IVPB 2g/100 mL premix        2 g 200 mL/hr over 30 Minutes Intravenous On call to O.R. 05/26/23 1233 05/26/23 1408   05/25/23 2000  vancomycin (VANCOCIN) IVPB 1000 mg/200 mL premix  Status:  Discontinued        1,000 mg 200 mL/hr over 60 Minutes Intravenous Every 24 hours 05/25/23 1007 05/27/23 1512   05/25/23 1000  vancomycin (VANCOCIN) IVPB 1000 mg/200 mL premix  Status:  Discontinued        1,000 mg 200 mL/hr over 60 Minutes Intravenous Every 12 hours 05/25/23 0410 05/25/23 1007   05/25/23 0800  ceFEPIme (MAXIPIME) 2 g in sodium chloride 0.9 % 100 mL IVPB        2 g 200 mL/hr over 30 Minutes Intravenous 2 times daily 05/25/23 0408     05/24/23 1900  ceFEPIme (MAXIPIME) 2 g in sodium chloride 0.9 % 100 mL IVPB        2 g 200 mL/hr over 30 Minutes Intravenous  Once 05/24/23 1854 05/24/23 2013   05/24/23 1900  vancomycin (VANCOREADY) IVPB 1750 mg/350 mL        1,750 mg 175 mL/hr over 120 Minutes Intravenous  Once 05/24/23 1859 05/25/23 0007     .  POD/HD#: 1  67 y/o male with large L upper extremity abscess    - L upper extremity abscess s/p repeat I&D              NWB LUEx             Splint, will  have side strut added              Ok to move fingers             Dressing to remain in place until follow up in 3 weeks    - ID:              Abx per ID   - Dispo:             Ok for dc from ortho standpoint  Follow up with ortho in 3 weeks  No further surgical intervention planned    Mearl Latin, PA-C [phone] (C) 06/01/2023, 8:26 AM  [facility] [address] [place] [address] [phone] Val [phone] (F)    After 5pm and on the weekends please log on to Amion, go to orthopaedics and the look under the [facility] Call for the provider(s) on call. You can also call our office at [phone] and then follow the prompts to be connected to the call team.  Patient ID: Justin Snow, male   DOB: 1955/10/05, 67 y.o.   MRN: [id number]

## 2023-06-01 NOTE — Plan of Care (Signed)
  Problem: Nutrition: Goal: Adequate nutrition will be maintained Outcome: Progressing   Problem: Education: Goal: Knowledge of General Education information will improve Description: Including pain rating scale, medication(s)/side effects and non-pharmacologic comfort measures Outcome: Not Progressing   Problem: Activity: Goal: Risk for activity intolerance will decrease Outcome: Not Progressing   Problem: Safety: Goal: Non-violent Restraint(s) Outcome: Not Progressing

## 2023-06-01 NOTE — Discharge Instructions (Addendum)
Orthopaedic Discharge instructions  Do not remove splint  Wear sling if possible Continue with ice and elevation of arm if possible Elevate fingers above elbow and elbow above heart Ok to move fingers  Splint and dressing to remain on for 3 weeks then remove and dc sutures

## 2023-06-01 NOTE — TOC Progression Note (Addendum)
Transition of Care [facility]) - Progression Note    Patient Details  Name: Justin Snow MRN: [id number] Date of Birth: Jan 18, 1956  Transition of Care [facility]) CM/SW Contact  Epifanio Lesches, RN Phone Number: 06/01/2023, 9:57 AM  Clinical Narrative:    Once d/c [facility] Per Jenna/[facility] case mgr. [phone]), approval received last week. Hospital MD will need to inform  receiving MD [facility] prison pt is a new admit when calling to give report, Dr Camelia Phenes [phone]). Pt will be transported to facility by custody car vs [facility].  TOC team following and will assiswith needs.   06/01/2023 Per hospital MD d/c summary will need to be faxed to a Dr. Tasia Catchings / [facility]  @ [phone] when available.  Expected Discharge Plan: Corrections Facility Barriers to Discharge: Continued Medical Work up  Expected Discharge Plan and Services                                               Social Determinants of Health (SDOH) Interventions SDOH Screenings   Food Insecurity: No Food Insecurity (05/24/2023)  Recent Concern: Food Insecurity - Food Insecurity Present (04/28/2023)  Housing: Patient Declined (05/24/2023)  Transportation Needs: No Transportation Needs (05/24/2023)  Recent Concern: Transportation Needs - Unmet Transportation Needs (04/28/2023)  Utilities: Not At Risk (05/24/2023)  Recent Concern: Utilities - At Risk (04/28/2023)  Tobacco Use: High Risk (05/31/2023)    Readmission Risk Interventions     No data to display

## 2023-06-02 DIAGNOSIS — M71129 Other infective bursitis, unspecified elbow: Secondary | ICD-10-CM

## 2023-06-02 DIAGNOSIS — M71122 Other infective bursitis, left elbow: Principal | ICD-10-CM

## 2023-06-02 DIAGNOSIS — L03114 Cellulitis of left upper limb: Secondary | ICD-10-CM | POA: Diagnosis not present

## 2023-06-02 LAB — AEROBIC/ANAEROBIC CULTURE W GRAM STAIN (SURGICAL/DEEP WOUND)

## 2023-06-02 LAB — GLUCOSE, CAPILLARY
Glucose-Capillary: 170 mg/dL — ABNORMAL HIGH (ref 70–99)
Glucose-Capillary: 142 mg/dL — ABNORMAL HIGH (ref 70–99)

## 2023-06-02 MED ORDER — DAPTOMYCIN IV (FOR PTA / DISCHARGE USE ONLY): 650.0000 mg | INTRAVENOUS | 0 refills | Status: AC

## 2023-06-02 MED ORDER — METFORMIN HCL 1000 MG PO TABS: 500.0000 mg | ORAL_TABLET | Freq: Two times a day (BID) | ORAL | 0 refills | Status: AC

## 2023-06-02 MED ORDER — CEFEPIME IV (FOR PTA / DISCHARGE USE ONLY): 2.0000 g | Freq: Two times a day (BID) | INTRAVENOUS | 0 refills | Status: AC

## 2023-06-02 NOTE — Progress Notes (Signed)
    Patient Name: MOHAB MEDNICK           DOB: 1956-04-03  MRN: [id number]      Admission Date: 05/24/2023  Attending Provider: Miguel Rota, MD  Primary Diagnosis: Left arm cellulitis   Level of care: Med-Surg    CROSS COVER NOTE   Date of Service   06/02/2023   KYLYN WANTZ, 67 y.o. male, was admitted on 05/24/2023 for Left arm cellulitis.    HPI/Events of Note   Agitated, restless, and ongoing confused- fluctuating mentation, mostly alert to self only Despite receiving IM Zyprexa, patient continues being restless and agitated. Patient continues needing waist belt restraint despite being on wrist handcuffs.   Unable to follow commands despite multiple attempts at redirection by staff.     Interventions/ Plan   Will trial IV Haldol for agitation.  Nursing staff reports some improvement after receiving Haldol. Restraint to be discontinued as soon as patient is able to safely have them removed.        Anthoney Harada, DNP, ACNPC- AG [facility]

## 2023-06-02 NOTE — Progress Notes (Signed)
PHARMACY CONSULT NOTE FOR:  OUTPATIENT  PARENTERAL ANTIBIOTIC THERAPY (OPAT)  Indication: Left arm cellulitis/bursitis  Regimen: Daptomycin 650 mg every 24 hours + Cefepime 2 gm every 12 hours End date: 06/21/23  IV antibiotic discharge orders are pended. To discharging provider:  please sign these orders via discharge navigator,  Select New Orders & click on the button choice - Manage This Unsigned Work.     Thank you for allowing pharmacy to be a part of this patient's care.  Sharin Mons, PharmD, BCPS, BCIDP Infectious Diseases Clinical Pharmacist Phone: [phone] 06/02/2023, 10:53 AM

## 2023-06-02 NOTE — Plan of Care (Signed)

## 2023-06-02 NOTE — Inpatient Diabetes Management (Signed)
Inpatient Diabetes Program Recommendations  AACE/ADA: New Consensus Statement on Inpatient Glycemic Control (2015)  Target Ranges:  Prepandial:   less than 140 mg/dL      Peak postprandial:   less than 180 mg/dL (1-2 hours)      Critically ill patients:  140 - 180 mg/dL   Lab Results  Component Value Date   GLUCAP 170 (H) 06/02/2023   HGBA1C 7.8 (H) 04/24/2023    Review of Glycemic Control  Latest Reference Range & Units 06/01/23 07:31 06/01/23 11:25 06/01/23 16:10 06/01/23 19:34 06/02/23 08:14  Glucose-Capillary 70 - 99 mg/dL 119 (H) 147 (H) 829 (H) 269 (H) 170 (H)   Diabetes history: DM 2 Outpatient Diabetes medications: Novolog 0-14 units tid Current orders for Inpatient glycemic control:  Semglee 5 units Daily Novolog 0-9 units tid + hs  A1c 7.8% on 7/28 No supplements Creat/BUN: 1.38/45   Inpatient Diabetes Program Recommendations:    Decadron 5 mg given, Glucose trends increase after PO intake.  -   Consider adding Novolog 2 units tid meal coverage if eating >50% of meals. Will need titration in the next 24-48 hours as decadron clears.  Thanks,  Christena Deem RN, MSN, BC-ADM Inpatient Diabetes Coordinator Team Pager [phone] (8a-5p)

## 2023-06-02 NOTE — Progress Notes (Addendum)
[facility] Infectious Diseases Follow Up Note  Patient Identification: Patient Name: Justin Snow MRN: [id number] Admit Date: 05/24/2023  3:48 PM Age: 67 y.o.Today's Date: 06/02/2023   Reason for Visit: LUE infection   Principal Problem:   Left arm cellulitis  Antibiotics:  Vancomycin 8/27-c Cefepime 8/27- c   Lines/Hardware: Midline single-lumen  Interval Events: Continues to be afebrile, no labs today.  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 OR cx MRSE ( was on Vancomycin and cefepime prior to OR) 9/3 s/p repeat I&D of left forearm, left humerus abscess and closure of wound. Per OR note, an area of soupy purulent fibrinous material along the lateral edge in the area of the elbow bursectomy, but on the soft tissue flap rather than directly over the bone.   Recommendations Will switch vancomycin to daptomycin for renal safety, continue cefepime for broad coverage including MRSE as patient was already on antibiotics before OR  Plan for 3 weeks from 9/3. EOT 06/21/23. Fu to re-assess further continuation Monitor CBC, BMP and CPK ID will sign off, recall if needed  OPAT Diagnosis: Intramuscular abscess/bursitis   Culture Result: MRSA ( on Vancomcyin and cefepime prior to OR)  No Known Allergies  OPAT Orders Discharge antibiotics to be given via PICC line Discharge antibiotics: daptomycin 650 Mg IV daily plus cefepime 2 g IV every 12 Per pharmacy protocol   Duration: 06/21/23 [facility] Care Per Protocol:  Home health RN for IV administration and teaching; PICC line care and labs.    Labs weekly while on IV antibiotics: X__ CBC with differential __ BMP X__ CMP X__ CRP X__ ESR __ Vancomycin trough X__ CK  __ Please pull PIC at completion of IV antibiotics X__ Please leave PIC in place until doctor has seen patient or been notified  Fax weekly labs to [phone]  Clinic Follow Up Appt: 06/21/23 at 1: 45 pm   Rest of the management as per the primary team. Thank you for the consult. Please page with pertinent questions or concerns.  ______________________________________________________________________ Subjective patient seen and examined at the bedside. He is sleeping. Cops at bedside who reports he had woken up at 3 am and slept. Appears patient has received IV haldol prior to my visit   Vitals BP (!) 143/69 (BP Location: Left Leg)   Pulse 63   Temp 98.7 F (37.1 C) (Oral)   Resp 16   Ht 5' 6.5" (1.689 m)   Wt 68 kg   SpO2 99%   BMI 23.85 kg/m     Physical Exam He is sleeping, I did not try to wake him up, LUE is bandaged   Pertinent Microbiology Results for orders placed or performed during the hospital encounter of 05/24/23  Blood culture (routine x 2)     Status: None   Collection Time: 05/24/23  5:15 PM   Specimen: BLOOD  Result Value Ref Range Status   Specimen Description BLOOD LEFT ANTECUBITAL  Final   Special Requests   Final    BOTTLES DRAWN AEROBIC ONLY Blood Culture adequate volume  Culture   Final    NO GROWTH 5 DAYS Performed at [facility], [address] [place], [place] [address]    Report Status 05/29/2023 FINAL  Final  Blood culture (routine x 2)     Status: None   Collection Time: 05/24/23  6:57 PM   Specimen: BLOOD RIGHT HAND  Result Value Ref Range Status   Specimen Description BLOOD RIGHT HAND  Final   Special Requests   Final    BOTTLES DRAWN AEROBIC ONLY Blood Culture results  may not be optimal due to an inadequate volume of blood received in culture bottles   Culture   Final    NO GROWTH 5 DAYS Performed at [facility], [address] [place], [place] [address]    Report Status 05/29/2023 FINAL  Final  Aerobic/Anaerobic Culture w Gram Stain (surgical/deep wound)     Status: None   Collection Time: 05/26/23  2:24 PM   Specimen: Abscess  Result Value Ref Range Status   Specimen Description ABSCESS LEFT ARM  Final   Special Requests NONE  Final   Gram Stain   Final    ABUNDANT WBC PRESENT, PREDOMINANTLY PMN NO ORGANISMS SEEN    Culture   Final    No growth aerobically or anaerobically. Performed at [facility], [address] [place], [place] [address]    Report Status 05/31/2023 FINAL  Final  Fungus Culture With Stain     Status: None (Preliminary result)   Collection Time: 05/26/23  3:14 PM   Specimen: Soft Tissue, Other  Result Value Ref Range Status   Fungus Stain Final report  Final    Comment: (NOTE) Performed At: [facility] [address] [place], [place] [id number] Jolene Schimke MD NU:[id number]    Fungus (Mycology) Culture PENDING  Incomplete   Fungal Source TISSUE  Final    Comment: LEFT ABSCESS FROM ARM Performed at [facility], [address], [place], [place] [address]   Aerobic/Anaerobic Culture w Gram Stain (surgical/deep wound)     Status: None   Collection Time: 05/26/23  3:14 PM   Specimen: Soft Tissue, Other  Result Value Ref Range Status   Specimen Description TISSUE LEFT ARM  Final   Special Requests FROM LEFT ARM ABSCESS  Final   Gram Stain   Final    ABUNDANT WBC PRESENT, PREDOMINANTLY PMN FEW GRAM POSITIVE COCCI IN PAIRS IN CHAINS Gram Stain Report Called to,Read Back By and Verified With: RN Leslye Peer [phone] @ [phone] FH    Culture   Final    RARE STAPHYLOCOCCUS EPIDERMIDIS NO ANAEROBES ISOLATED Performed at [facility], [address] [place], [place] [address]    Report  Status 06/02/2023 FINAL  Final   Organism ID, Bacteria STAPHYLOCOCCUS EPIDERMIDIS  Final      Susceptibility   Staphylococcus epidermidis - MIC*    CIPROFLOXACIN <=0.5 SENSITIVE Sensitive     ERYTHROMYCIN <=0.25 SENSITIVE Sensitive     GENTAMICIN <=0.5 SENSITIVE Sensitive     OXACILLIN >=4 RESISTANT Resistant     TETRACYCLINE 2 SENSITIVE Sensitive     VANCOMYCIN 2 SENSITIVE Sensitive     TRIMETH/SULFA <=10 SENSITIVE Sensitive     CLINDAMYCIN <=0.25 SENSITIVE Sensitive     RIFAMPIN <=0.5 SENSITIVE Sensitive     Inducible Clindamycin NEGATIVE Sensitive     * RARE STAPHYLOCOCCUS EPIDERMIDIS  Acid Fast Smear (AFB)     Status: None   Collection Time: 05/26/23  3:14 PM   Specimen: Soft Tissue, Other  Result Value Ref Range Status   AFB Specimen Processing Comment  Final    Comment: Tissue Grinding and Digestion/Decontamination   Acid Fast Smear Negative  Final    Comment: (NOTE) Performed At: [facility] [address] [place], [place] [id number] Jolene Schimke MD BM:[id number]    Source (AFB) TISSUE  Final    Comment: LEFT ABSCESS FROM ARM Performed at [facility], [address] [place], [place] [address]   Fungus Culture Result     Status: None   Collection Time: 05/26/23  3:14 PM  Result Value Ref Range Status   Result 1 Comment  Final    Comment: (NOTE) KOH/Calcofluor preparation:  no fungus observed. Performed At: [facility] [address] [place], [place] [id number] Jolene Schimke MD QV:[id number]   MRSA Next Gen by PCR, Nasal     Status: None   Collection Time: 05/30/23  9:03 PM   Specimen: Nasal Mucosa; Nasal Swab  Result Value Ref Range Status   MRSA by PCR Next Gen NOT DETECTED NOT DETECTED Final    Comment: (NOTE) The GeneXpert MRSA Assay (FDA approved for NASAL specimens only), is one component of a comprehensive MRSA colonization surveillance program. It is not intended to diagnose MRSA infection nor to guide or monitor treatment  for MRSA infections. Test performance is not FDA approved in patients less than 37 years old. Performed at [facility], [address] [place] [address], [place], [place] [address]    Pertinent Lab.    Latest Ref Rng & Units 06/01/2023    7:49 AM 05/31/2023    4:44 AM 05/30/2023    3:12 AM  CBC  WBC 4.0 - 10.5 K/uL 12.2  13.2  10.9   Hemoglobin 13.0 - 17.0 g/dL 9.8  88.4  16.6   Hematocrit 39.0 - 52.0 % 29.6  30.6  31.5   Platelets 150 - 400 K/uL 270  248  255       Latest Ref Rng & Units 06/01/2023    7:49 AM 05/31/2023    4:44 AM 05/30/2023    3:12 AM  CMP  Glucose 70 - 99 mg/dL 063  016  010   BUN 8 - 23 mg/dL 45  45  34   Creatinine 0.61 - 1.24 mg/dL 9.32  3.55  7.32   Sodium 135 - 145 mmol/L 134  131  132   Potassium 3.5 - 5.1 mmol/L 4.9  3.7  4.0   Chloride 98 - 111 mmol/L 103  102  102   CO2 22 - 32 mmol/L 21  22  22    Calcium 8.9 - 10.3 mg/dL 8.4  8.3  8.3    Pertinent Imaging today Plain films and CT images have been personally visualized and interpreted; radiology reports have been reviewed. Decision making incorporated into the Impression  No results found.  I have personally spent 53  minutes involved in face-to-face and non-face-to-face activities for this patient on the day of the visit. Professional time spent includes the following activities: Preparing to see the patient (review of tests), Obtaining and/or reviewing separately obtained history (admission/discharge record), Performing a medically appropriate examination and/or evaluation , Ordering medications/tests/procedures, referring and communicating with other health care professionals, Documenting clinical information in the EMR, Independently interpreting results (not separately reported), Communicating results to the patient/family/caregiver, Counseling and educating the patient/family/caregiver and Care coordination (not separately reported).   Plan d/w requesting provider as well as  ID pharm D  Note: This document was  prepared using dragon voice recognition software and may include unintentional dictation errors.   Electronically signed by:   Odette Fraction, MD Infectious Disease Physician [facility] for Infectious Disease Pager: [phone]

## 2023-06-02 NOTE — TOC Transition Note (Signed)
Transition of Care [facility]) - CM/SW Discharge Note   Patient Details  Name: Justin Snow MRN: [id number] Date of Birth: [birth year]  Transition of Care [facility]) CM/SW Contact:  Epifanio Lesches, RN Phone Number: 06/02/2023, 1:38 PM   Clinical Narrative:    Patient will DC to: home Anticipated DC date: 06/02/2023 Transport by: Elberta Fortis   Per MD patient ready for DC today . RN, patient, and [employer] facility notified of DC. D/C summary faxed to Dr. Tasia Catchings / [facility] @ [phone] with prescriptions. Transportation will be provided [facility] to [facility]  [facility] will sign off for now as intervention is no longer needed. Please consult [place] again if new needs arise.    Patient Goals and CMS Choice      Discharge Placement    Discharge Plan and Services Additional resources added to the After Visit Summary for        Social Determinants of Health (SDOH) Interventions SDOH Screenings   Food Insecurity: No Food Insecurity (05/24/2023)  Recent Concern: Food Insecurity - Food Insecurity Present (04/28/2023)  Housing: Patient Declined (05/24/2023)  Transportation Needs: No Transportation Needs (05/24/2023)  Recent Concern: Transportation Needs - Unmet Transportation Needs (04/28/2023)  Utilities: Not At Risk (05/24/2023)  Recent Concern: Utilities - At Risk (04/28/2023)  Tobacco Use: High Risk (05/31/2023)     Readmission Risk Interventions     No data to display

## 2023-06-02 NOTE — Discharge Summary (Signed)
Justin Snow EPP:[id number] DOB: [birth year] DOA: 05/24/2023  PCP: Pcp, No  Admit date: 05/24/2023 Discharge date: 06/02/2023  Time spent: 35 minutes  Recommendations for Outpatient Follow-up:  Follow-up orthopedic surgery Dr. Myrene Galas in 3 weeks, call [phone] to schedule that appointment. Keep dressing on left arm until orthopedics follow-up See below for specifics of IV antibiotics which need to continue through 9/24. Should follow up with Dr. Karene Fry on 9/24 at 1:45 PM  [facility] [address] [place], [place] [address] [phone]    Discharge Diagnoses:  Principal Problem:   Septic bursitis of elbow Active Problems:   Left arm cellulitis   Discharge Condition: stable  Diet recommendation: heart healthy  Filed Weights   05/24/23 1928 05/26/23 1321 05/31/23 1038  Weight: 99.8 kg 99.8 kg 68 kg    History of present illness:  From admission h and p Justin Snow is a 67 y.o. incarcerated male with a known history of asthma, diabetes, hypertension, CKD stage IIIa, CVA presents to the emergency department for evaluation of arm swelling.     Of note patient was [facility] the [facility] on 8/20 after being treated for cellulitis of the left forearm-completed Vanco and Unasyn and then transition to Augmentin and Doxy.  He returns today with worsening left upper extremity swelling redness and pain.   Patient denies fevers/chills, weakness, dizziness, chest pain, shortness of breath, N/V/C/D, abdominal pain, dysuria/frequency, changes in mental status.     Hospital Course:  Left elbow septic bursitis - Failed outpatient antibiotic, CT upper extremity showed worsening of infection.  Underwent extensive I&D by orthopedic on 8/29 with application of wound VAC.  Status post repeat I&D on 9/3.  Ortho recommending nonweightbearing of left upper extremity, splint, okay to move fingers and dressing to remain in  place for 3 weeks. - has midline for IV abx - continue daptomycin 650 mg IV daily plus cefepime 2 g bid through 9/24. Will need weekly cbc with diff, cmp, ck and fax those results to [phone].  Should follow up with ID on 9/24 @ 1:45, keep midline in place until then   History of asthma - Bronchodilators as needed   Diabetes mellitus type 2 - Diabetic diet.  Sliding scale and Accu-Cheks. - Adjust insulin as necessary.   CKD stage IIIa -Creatinine stable around baseline of 1.5   Essential hypertension -Norvasc, Coreg, lisinopril.  IV as needed   History of CVA -MRI was positive for CVA in July 2024.  On statin which should be held while on daptomycin   Depression -Cymbalta  Procedures: See above   Consultations: ID, orthopedics  Discharge Exam: Vitals:   06/01/23 1938 06/02/23 0800  BP: 116/66 (!) 143/69  Pulse: 72 63  Resp:  16  Temp: (!) 97.5 F (36.4 C) 98.7 F (37.1 C)  SpO2: 100% 99%    General: NAD Cardiovascular: RRR Respiratory: CTAB Left arm dressed  Discharge Instructions   Discharge Instructions     Diet - low sodium heart healthy   Complete by: As directed    Home infusion instructions   Complete by: As directed    Instructions: Flushing of vascular access device: 0.9% NaCl pre/post medication administration and prn patency; Heparin 100 u/ml, 5ml for implanted ports and Heparin 10u/ml, 5ml for all other central venous catheters.   Increase activity slowly   Complete by: As directed    Leave dressing on - Keep it clean, dry, and intact  until clinic visit   Complete by: As directed       Allergies as of 06/02/2023   No Known Allergies      Medication List     STOP taking these medications    amoxicillin-clavulanate 875-125 MG tablet Commonly known as: AUGMENTIN   atorvastatin 10 MG tablet Commonly known as: LIPITOR   doxycycline 100 MG tablet Commonly known as: VIBRA-TABS   gabapentin 400 MG capsule Commonly known as:  NEURONTIN   hydrochlorothiazide 12.5 MG tablet Commonly known as: HYDRODIURIL       TAKE these medications    albuterol 108 (90 Base) MCG/ACT inhaler Commonly known as: VENTOLIN HFA Inhale into the lungs every 6 (six) hours as needed for wheezing or shortness of breath.   amLODipine 10 MG tablet Commonly known as: NORVASC Take 1 tablet (10 mg total) by mouth daily.   Blood Glucose Monitor System w/Device Kit Use 3 (three) times daily.   carvedilol 6.25 MG tablet Commonly known as: COREG Take 1 tablet (6.25 mg total) by mouth 2 (two) times daily.   ceFEPime IVPB Commonly known as: MAXIPIME Inject 2 g into the vein every 12 (twelve) hours for 19 days. Indication:  Left arm cellulitis/bursitis  First Dose: Yes Last Day of Therapy:  06/21/23 Labs - Once weekly:  CBC/D and BMP, Labs - Once weekly: ESR and CRP   daptomycin IVPB Commonly known as: CUBICIN Inject 650 mg into the vein daily for 19 days. Indication:  Left arm cellulitis/bursitis  First Dose: Yes Last Day of Therapy:  06/21/23 Labs - Once weekly:  CBC/D, BMP, and CPK Labs - Once weekly: ESR and CRP   DULoxetine 30 MG capsule Commonly known as: CYMBALTA Take 1 capsule (30 mg total) by mouth 2 (two) times daily.   fluticasone-salmeterol 115-21 MCG/ACT inhaler Commonly known as: ADVAIR HFA Inhale 2 puffs into the lungs 2 (two) times daily.   Gvoke HypoPen 2-Pack 1 MG/0.2ML Soaj Generic drug: Glucagon Inject 1 mg into the skin as needed for up to 2 doses (Severe low blood sugar).   insulin glargine-yfgn 100 UNIT/ML Pen Commonly known as: Semglee (yfgn) Inject 15 Units into the skin daily.   insulin regular 100 units/mL injection Commonly known as: NOVOLIN R Inject 0-14 Units into the skin 3 (three) times daily before meals. Sliding Scale 0-150: 0 units 151-200: 2 units 201-250: 4 units 251-300: 6 units 301-350: 8 units 351-400: 10 units [id number]: 14 units & contacts provider.   INSULIN SYRINGE  .5CC/30GX1/2" 30G X 1/2" 0.5 ML Misc Use as directed with insulin.   lisinopril 40 MG tablet Commonly known as: ZESTRIL Take 1 tablet (40 mg total) by mouth daily.   metFORMIN 1000 MG tablet Commonly known as: GLUCOPHAGE Take 0.5 tablets (500 mg total) by mouth 2 (two) times daily. What changed: how much to take   NovoLOG FlexPen 100 UNIT/ML FlexPen Generic drug: insulin aspart Inject 0-6 Units into the skin 3 (three) times daily with meals. Check Blood Glucose (BG) and inject per scale: BG <150= 0 unit; BG 150-200= 1 unit; BG 201-250= 2 unit; BG 251-300= 3 unit; BG 301-350= 4 unit; BG 351-400= 5 unit; BG >400= 6 unit and Call Primary Care.   omeprazole 20 MG capsule Commonly known as: PRILOSEC Take 1 capsule (20 mg total) by mouth every morning.   TechLite Pen Needles 31G X 8 MM Misc Generic drug: Insulin Pen Needle Use 3 (three) times daily.   True Metrix Blood Glucose Test test strip  Generic drug: glucose blood Use as directed to check blood sugar three times daily   TRUEdraw Lancing Device Misc Use 3 (three) times daily. May dispense any manufacturer covered by patient's insurance   TRUEplus Lancets 28G Misc Use 3 (three) times daily as directed to check blood sugar               Home Infusion Instuctions  (From admission, onward)           Start     Ordered   06/02/23 0000  Home infusion instructions       Question:  Instructions  Answer:  Flushing of vascular access device: 0.9% NaCl pre/post medication administration and prn patency; Heparin 100 u/ml, 5ml for implanted ports and Heparin 10u/ml, 5ml for all other central venous catheters.   06/02/23 1246              Discharge Care Instructions  (From admission, onward)           Start     Ordered   06/02/23 0000  Leave dressing on - Keep it clean, dry, and intact until clinic visit        06/02/23 1246           No Known Allergies  Follow-up Information     Myrene Galas, MD.  Schedule an appointment as soon as possible for a visit in 3 week(s).   Specialty: Orthopedic Surgery Contact information: [address] [place] [address] [phone]                  The results of significant diagnostics from this hospitalization (including imaging, microbiology, ancillary and laboratory) are listed below for reference.    Significant Diagnostic Studies: CT HUMERUS LEFT W CONTRAST  Result Date: 05/26/2023 CLINICAL DATA:  Left arm cellulitis follow-up. EXAM: CT OF THE UPPER LEFT EXTREMITY WITH CONTRAST TECHNIQUE: Multidetector CT imaging of the left upper arm, left forearm, and left hand was performed according to the standard protocol following intravenous contrast administration. RADIATION DOSE REDUCTION: This exam was performed according to the departmental dose-optimization program which includes automated exposure control, adjustment of the mA and/or kV according to patient size and/or use of iterative reconstruction technique. CONTRAST:  75mL OMNIPAQUE IOHEXOL 350 MG/ML SOLN COMPARISON:  CT left upper arm and forearm dated May 16, 2023. FINDINGS: Bones/Joint/Cartilage No bony destruction or periosteal reaction. No acute fracture or dislocation. Sequelae of prior remote left acromioclavicular joint injury again noted. 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 Unchanged small elbow joint effusion. Ligaments Ligaments are suboptimally evaluated by CT. Muscles and Tendons Grossly intact. Worsening hypodensity and enlargement of the left triceps muscle with new fluid collection described below. Remaining muscles appear normal. Small lipomas within the left triceps and brachialis muscles again noted. Soft tissue Progressive severe circumferential skin thickening and soft tissue swelling the left arm and hand. New large irregular rim enhancing subcutaneous and intramuscular fluid collection involving the  posterior arm and triceps muscle, extending from the mid upper arm to the mid forearm. This measures up to 3.3 x 6.7 x 29.8 cm (AP by transverse by CC) (series 9, image 63). Additional 1.2 x 1.2 x 5.0 cm rim enhancing fluid collection in the central triceps muscle (series 4, image 291; series 9, image 63). Smaller 4.4 x 1.1 x 5.2 cm rim enhancing fluid collection along the medial aspect of the proximal forearm (series 4, image 215). No subcutaneous emphysema. Possible new skin ulceration of  the antecubital fossa (series 4, image 231). Multiple enlarged reactive left axillary lymph nodes again noted. Mild centrilobular emphysema in the visualized lungs. IMPRESSION: 1. Progressive severe cellulitis of the entire left arm and hand with new large subcutaneous and intramuscular abscess involving the posterior arm and triceps muscle, extending from the mid upper arm to the mid forearm. Additional smaller abscesses in the central triceps muscle and medial aspect of the proximal forearm. 2. Increased hypodensity and enlargement of the left triceps muscle, concerning for worsening infectious myositis. 3. Possible new skin ulceration of the antecubital fossa. Correlate with physical exam. 4. No evidence of osteomyelitis. 5.  Emphysema (ICD10-J43.9). Electronically Signed   By: Obie Dredge M.D.   On: 05/26/2023 09:51   CT FOREARM LEFT W CONTRAST  Result Date: 05/26/2023 CLINICAL DATA:  Left arm cellulitis follow-up. EXAM: CT OF THE UPPER LEFT EXTREMITY WITH CONTRAST TECHNIQUE: Multidetector CT imaging of the left upper arm, left forearm, and left hand was performed according to the standard protocol following intravenous contrast administration. RADIATION DOSE REDUCTION: This exam was performed according to the departmental dose-optimization program which includes automated exposure control, adjustment of the mA and/or kV according to patient size and/or use of iterative reconstruction technique. CONTRAST:  75mL  OMNIPAQUE IOHEXOL 350 MG/ML SOLN COMPARISON:  CT left upper arm and forearm dated May 16, 2023. FINDINGS: Bones/Joint/Cartilage No bony destruction or periosteal reaction. No acute fracture or dislocation. Sequelae of prior remote left acromioclavicular joint injury again noted. 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 Unchanged small elbow joint effusion. Ligaments Ligaments are suboptimally evaluated by CT. Muscles and Tendons Grossly intact. Worsening hypodensity and enlargement of the left triceps muscle with new fluid collection described below. Remaining muscles appear normal. Small lipomas within the left triceps and brachialis muscles again noted. Soft tissue Progressive severe circumferential skin thickening and soft tissue swelling the left arm and hand. New large irregular rim enhancing subcutaneous and intramuscular fluid collection involving the posterior arm and triceps muscle, extending from the mid upper arm to the mid forearm. This measures up to 3.3 x 6.7 x 29.8 cm (AP by transverse by CC) (series 9, image 63). Additional 1.2 x 1.2 x 5.0 cm rim enhancing fluid collection in the central triceps muscle (series 4, image 291; series 9, image 63). Smaller 4.4 x 1.1 x 5.2 cm rim enhancing fluid collection along the medial aspect of the proximal forearm (series 4, image 215). No subcutaneous emphysema. Possible new skin ulceration of the antecubital fossa (series 4, image 231). Multiple enlarged reactive left axillary lymph nodes again noted. Mild centrilobular emphysema in the visualized lungs. IMPRESSION: 1. Progressive severe cellulitis of the entire left arm and hand with new large subcutaneous and intramuscular abscess involving the posterior arm and triceps muscle, extending from the mid upper arm to the mid forearm. Additional smaller abscesses in the central triceps muscle and medial aspect of the proximal forearm. 2. Increased hypodensity  and enlargement of the left triceps muscle, concerning for worsening infectious myositis. 3. Possible new skin ulceration of the antecubital fossa. Correlate with physical exam. 4. No evidence of osteomyelitis. 5.  Emphysema (ICD10-J43.9). Electronically Signed   By: Obie Dredge M.D.   On: 05/26/2023 09:51   CT HAND LEFT W CONTRAST  Result Date: 05/26/2023 CLINICAL DATA:  Left arm cellulitis follow-up. EXAM: CT OF THE UPPER LEFT EXTREMITY WITH CONTRAST TECHNIQUE: Multidetector CT imaging of the left upper arm, left forearm, and left hand was performed according to  the standard protocol following intravenous contrast administration. RADIATION DOSE REDUCTION: This exam was performed according to the departmental dose-optimization program which includes automated exposure control, adjustment of the mA and/or kV according to patient size and/or use of iterative reconstruction technique. CONTRAST:  75mL OMNIPAQUE IOHEXOL 350 MG/ML SOLN COMPARISON:  CT left upper arm and forearm dated May 16, 2023. FINDINGS: Bones/Joint/Cartilage No bony destruction or periosteal reaction. No acute fracture or dislocation. Sequelae of prior remote left acromioclavicular joint injury again noted. 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 Unchanged small elbow joint effusion. Ligaments Ligaments are suboptimally evaluated by CT. Muscles and Tendons Grossly intact. Worsening hypodensity and enlargement of the left triceps muscle with new fluid collection described below. Remaining muscles appear normal. Small lipomas within the left triceps and brachialis muscles again noted. Soft tissue Progressive severe circumferential skin thickening and soft tissue swelling the left arm and hand. New large irregular rim enhancing subcutaneous and intramuscular fluid collection involving the posterior arm and triceps muscle, extending from the mid upper arm to the mid forearm. This  measures up to 3.3 x 6.7 x 29.8 cm (AP by transverse by CC) (series 9, image 63). Additional 1.2 x 1.2 x 5.0 cm rim enhancing fluid collection in the central triceps muscle (series 4, image 291; series 9, image 63). Smaller 4.4 x 1.1 x 5.2 cm rim enhancing fluid collection along the medial aspect of the proximal forearm (series 4, image 215). No subcutaneous emphysema. Possible new skin ulceration of the antecubital fossa (series 4, image 231). Multiple enlarged reactive left axillary lymph nodes again noted. Mild centrilobular emphysema in the visualized lungs. IMPRESSION: 1. Progressive severe cellulitis of the entire left arm and hand with new large subcutaneous and intramuscular abscess involving the posterior arm and triceps muscle, extending from the mid upper arm to the mid forearm. Additional smaller abscesses in the central triceps muscle and medial aspect of the proximal forearm. 2. Increased hypodensity and enlargement of the left triceps muscle, concerning for worsening infectious myositis. 3. Possible new skin ulceration of the antecubital fossa. Correlate with physical exam. 4. No evidence of osteomyelitis. 5.  Emphysema (ICD10-J43.9). Electronically Signed   By: Obie Dredge M.D.   On: 05/26/2023 09:51   VAS [place] UPPER EXTREMITY VENOUS DUPLEX  Result Date: 05/25/2023 UPPER VENOUS STUDY  Patient Name:  TROOPER LANGREHR  Date of Exam:   05/25/2023 Medical Rec #: [id number]           Accession #:    [id number] Date of Birth: 1955-10-16           Patient Gender: M Patient Age:   31 years Exam Location:  [facility] Procedure:      VAS [place] UPPER EXTREMITY VENOUS DUPLEX Referring Phys: ALEXIS HUGELMEYER --------------------------------------------------------------------------------  Indications: Swelling, and Erythema Other Indications: LUE cellulitis. Limitations: Body habitus and poor ultrasound/tissue interface. Comparison Study: Previous exam on 05/16/2023 was negative for DVT Performing  Technologist: Ernestene Mention RVT, RDMS  Examination Guidelines: A complete evaluation includes B-mode imaging, spectral Doppler, color Doppler, and power Doppler as needed of all accessible portions of each vessel. Bilateral testing is considered an integral part of a complete examination. Limited examinations for reoccurring indications may be performed as noted.  Right Findings: +----------+------------+---------+-----------+----------+-------+ RIGHT     CompressiblePhasicitySpontaneousPropertiesSummary +----------+------------+---------+-----------+----------+-------+ Subclavian    Full       Yes       Yes                      +----------+------------+---------+-----------+----------+-------+  Left Findings: +----------+------------+---------+-----------+----------+--------------------+ LEFT      CompressiblePhasicitySpontaneousProperties      Summary        +----------+------------+---------+-----------+----------+--------------------+ IJV           Full       Yes       Yes                                   +----------+------------+---------+-----------+----------+--------------------+ Subclavian    Full       Yes       Yes                                   +----------+------------+---------+-----------+----------+--------------------+ Axillary      Full       Yes       Yes                                   +----------+------------+---------+-----------+----------+--------------------+ Brachial      Full       Yes       Yes                                   +----------+------------+---------+-----------+----------+--------------------+ Radial                                              not well visualized  +----------+------------+---------+-----------+----------+--------------------+ Ulnar                    Yes       Yes                   patent by                                                              color/doppler      +----------+------------+---------+-----------+----------+--------------------+ Cephalic      Full                                                       +----------+------------+---------+-----------+----------+--------------------+ Basilic       Full       Yes       Yes                                   +----------+------------+---------+-----------+----------+--------------------+  Summary:  Right: No evidence of thrombosis in the subclavian. Subcutaneous edema throughout LUE.  Left: No evidence of deep vein thrombosis in the upper extremity. No evidence of superficial vein thrombosis in the upper extremity.  *See table(s) above for measurements and observations.  Diagnosing physician: Sherald Hess MD Electronically signed by Sherald Hess MD on 05/25/2023 at 1:14:08 PM.  Final    DG Elbow Complete Left  Result Date: 05/24/2023 CLINICAL DATA:  Arm swelling, infection/cellulitis, pain EXAM: LEFT ELBOW - COMPLETE 3+ VIEW COMPARISON:  05/15/2023 FINDINGS: Diffuse extremity soft tissue swelling/edema. Limited exam because of positioning. No acute osseous finding, fracture or definite large effusion. No acute joint abnormality. IMPRESSION: Diffuse soft tissue swelling/edema. No acute osseous finding by plain radiography. Electronically Signed   By: Judie Petit.  Shick M.D.   On: 05/24/2023 17:46   CT HUMERUS LEFT W CONTRAST  Result Date: 05/17/2023 CLINICAL DATA:  Soft tissue infection suspected, upper arm, no prior imaging EXAM: CT OF THE UPPER LEFT EXTREMITY WITH CONTRAST TECHNIQUE: Multidetector CT imaging of the left humerus was performed according to the standard protocol following intravenous contrast administration. RADIATION DOSE REDUCTION: This exam was performed according to the departmental dose-optimization program which includes automated exposure control, adjustment of the mA and/or kV according to patient size and/or use of iterative reconstruction technique. CONTRAST:  75mL  OMNIPAQUE IOHEXOL 350 MG/ML SOLN COMPARISON:  X-ray 05/15/2023 FINDINGS: Bones/Joint/Cartilage Left humerus intact without fracture or dislocation. Sequela of remote [facility] joint injury with widening and fragmentation at the [facility] joint and coracoclavicular interval. Moderate glenohumeral joint osteoarthritis. No appreciable glenohumeral joint effusion. Small elbow joint effusion. No erosion or periosteal elevation. Ligaments Suboptimally assessed by CT. Muscles and Tendons Incidental note of a 2.8 x 0.8 cm intramuscular lipoma within the mid left triceps muscle. Additional 4.1 x 1.1 cm lipoma within the brachialis muscle of the mid upper arm. Decreased attenuation within the superficial aspect of the mid to distal triceps muscle belly likely representing intramuscular edema/myositis. No organized intramuscular fluid collection. Soft tissues Skin thickening with extensive soft tissue edema and fluid within the subcutaneous soft tissues of the upper arm, most confluent at the posterior aspect of the elbow. No organized or rim enhancing fluid collection is present at this time. No soft tissue gas. Multiple enlarged left axillary lymph nodes, likely reactive. IMPRESSION: 1. Skin thickening with extensive soft tissue edema and fluid within the subcutaneous soft tissues of the upper arm, most confluent at the posterior aspect of the elbow. Findings are compatible with cellulitis. No organized or rim enhancing fluid collection is present at this time. 2. Decreased attenuation within the superficial aspect of the mid to distal triceps muscle belly likely representing intramuscular edema/myositis. 3. Small elbow joint effusion, nonspecific. Septic arthritis not excluded. 4. No CT evidence of osteomyelitis. 5. Multiple enlarged left axillary lymph nodes, likely reactive. Electronically Signed   By: Duanne Guess D.O.   On: 05/17/2023 08:52   CT FOREARM LEFT W CONTRAST  Result Date: 05/17/2023 CLINICAL DATA:  Soft tissue  infection suspected, forearm, xray done EXAM: CT OF THE UPPER LEFT EXTREMITY WITH CONTRAST TECHNIQUE: Multidetector CT imaging of the left forearm was performed according to the standard protocol following intravenous contrast administration. RADIATION DOSE REDUCTION: This exam was performed according to the departmental dose-optimization program which includes automated exposure control, adjustment of the mA and/or kV according to patient size and/or use of iterative reconstruction technique. CONTRAST:  75mL OMNIPAQUE IOHEXOL 350 MG/ML SOLN COMPARISON:  X-ray 05/15/2023 FINDINGS: Bones/Joint/Cartilage Left radius and ulna are intact. No fracture or dislocation. Small elbow joint effusion, nonspecific. 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 Mild degenerative changes within the wrist. No erosion or periosteal elevation. Ligaments Suboptimally assessed by CT. Muscles and Tendons No acute musculotendinous abnormality by CT. No intramuscular fluid collection is identified. Soft tissues Skin thickening with extensive subcutaneous edema and  ill-defined fluid throughout the soft tissues of the left forearm. Fluid is most confluent at the dorsal aspect of the elbow and proximal forearm. No well-defined or rim enhancing fluid collection is evident at this time. No soft tissue gas. IMPRESSION: 1. Skin thickening with extensive subcutaneous edema and ill-defined fluid throughout the soft tissues of the left forearm, most compatible with cellulitis. No well-defined or rim enhancing fluid collection is evident at this time. 2. Small elbow joint effusion, nonspecific but likely reactive. Septic arthritis is not entirely excluded. 3. No evidence of osteomyelitis. 4. 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 Electronically Signed   By: Duanne Guess D.O.   On: 05/17/2023 08:42   VAS [place] UPPER EXTREMITY VENOUS DUPLEX  Result Date: 05/16/2023 UPPER VENOUS STUDY  Patient Name:  DARCELL GERBINO  Date of Exam:   05/16/2023 Medical Rec #: [id number]           Accession #:    [id number] Date of Birth: July 05, 1956           Patient Gender: M Patient Age:   80 years Exam Location:  [facility] Procedure:      VAS [place] UPPER EXTREMITY VENOUS DUPLEX Referring Phys: MICHAEL JEFFERY --------------------------------------------------------------------------------  Indications: cellulitis, Swelling, and Erythema Limitations: Body habitus, poor ultrasound/tissue interface and lack of cooperation. Comparison Study: No previous study. Performing Technologist: McKayla Maag RVT, [place]  Examination Guidelines: A complete evaluation includes B-mode imaging, spectral Doppler, color Doppler, and power Doppler as needed of all accessible portions of each vessel. Bilateral testing is considered an integral part of a complete examination. Limited examinations for reoccurring indications may be performed as noted.  Right Findings: +----------+------------+---------+-----------+----------+-------+ RIGHT     CompressiblePhasicitySpontaneousPropertiesSummary +----------+------------+---------+-----------+----------+-------+ Subclavian               Yes       Yes                      +----------+------------+---------+-----------+----------+-------+  Left Findings: +----------+------------+---------+-----------+----------+--------------+ LEFT      CompressiblePhasicitySpontaneousProperties   Summary     +----------+------------+---------+-----------+----------+--------------+ IJV           Full       Yes       Yes                             +----------+------------+---------+-----------+----------+--------------+ Subclavian    Full       Yes       Yes                             +----------+------------+---------+-----------+----------+--------------+ Axillary      Full       Yes       Yes                              +----------+------------+---------+-----------+----------+--------------+ Brachial      Full       Yes       Yes                             +----------+------------+---------+-----------+----------+--------------+ Radial        Full                                                 +----------+------------+---------+-----------+----------+--------------+  Ulnar                                               Not visualized +----------+------------+---------+-----------+----------+--------------+ Cephalic      Full                                                 +----------+------------+---------+-----------+----------+--------------+ Basilic                                             Not visualized +----------+------------+---------+-----------+----------+--------------+  Summary:  Right: No evidence of thrombosis in the subclavian.  Left: No evidence of deep vein thrombosis in the upper extremity. No evidence of superficial vein thrombosis in the upper extremity. Limited study due to patient's unwillingness to move arm away from his body.  *See table(s) above for measurements and observations.  Diagnosing physician: Sherald Hess MD Electronically signed by Sherald Hess MD on 05/16/2023 at 6:23:40 PM.    Final    DG Forearm Left  Result Date: 05/15/2023 CLINICAL DATA:  Infection on arm with new onset confusion. Arm swollen from shoulder to fingertips. EXAM: LEFT FOREARM - 2 VIEW; LEFT ELBOW - 2 VIEW COMPARISON:  None Available. FINDINGS: Diffuse soft tissue swelling about the elbow and forearm. No acute fracture or dislocation. No evidence of osteomyelitis. No elbow joint effusion. IMPRESSION: Diffuse soft tissue swelling of the left arm without acute osseous abnormality. Electronically Signed   By: Minerva Fester M.D.   On: 05/15/2023 21:41   DG Elbow 2 Views Left  Result Date: 05/15/2023 CLINICAL DATA:  Infection on arm with new onset confusion. Arm swollen from  shoulder to fingertips. EXAM: LEFT FOREARM - 2 VIEW; LEFT ELBOW - 2 VIEW COMPARISON:  None Available. FINDINGS: Diffuse soft tissue swelling about the elbow and forearm. No acute fracture or dislocation. No evidence of osteomyelitis. No elbow joint effusion. IMPRESSION: Diffuse soft tissue swelling of the left arm without acute osseous abnormality. Electronically Signed   By: Minerva Fester M.D.   On: 05/15/2023 21:41   CT HEAD WO CONTRAST ( )  Result Date: 05/11/2023 CLINICAL DATA:  Mental status change, unknown cause EXAM: CT HEAD WITHOUT CONTRAST TECHNIQUE: Contiguous axial images were obtained from the base of the skull through the vertex without intravenous contrast. RADIATION DOSE REDUCTION: This exam was performed according to the departmental dose-optimization program which includes automated exposure control, adjustment of the mA and/or kV according to patient size and/or use of iterative reconstruction technique. COMPARISON:  CT head 05/02/23 FINDINGS: Brain: No hemorrhage. No hydrocephalus. No extra-axial fluid collection. 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 No CT evidence of acute cortical infarct. No mass effect. No mass lesion. Vascular: No hyperdense vessel or unexpected calcification. Skull: Normal. Negative for fracture or focal lesion. Sinuses/Orbits: No middle ear or mastoid effusion. Paranasal sinuses are clear. Orbits are unremarkable. Other: None. IMPRESSION: No CT etiology for altered mental status identified. Electronically Signed   By: Lorenza Cambridge M.D.   On: 05/11/2023 10:35   DG Chest Portable 1 View  Result Date: 05/11/2023 CLINICAL DATA:  Altered mental  status Fever Hypertension Tachycardia EXAM: PORTABLE CHEST - 1 VIEW COMPARISON:  05/06/2023 FINDINGS: Cardiomediastinal silhouette and pulmonary vasculature are within normal limits. Minimal opacities in the right medial lung base favored to be atelectasis rather than pneumonia.  Lungs are otherwise clear. Chronic left AC joint separation. IMPRESSION: No acute cardiopulmonary process. Electronically Signed   By: Acquanetta Belling M.D.   On: 05/11/2023 09:21   DG Chest Portable 1 View  Result Date: 05/06/2023 CLINICAL DATA:  Shortness of breath x1 year EXAM: PORTABLE CHEST 1 VIEW COMPARISON:  05/02/2023 FINDINGS: Lungs are clear.  No pleural effusion or pneumothorax. The heart is normal in size. IMPRESSION: No acute cardiopulmonary disease. Electronically Signed   By: Charline Bills M.D.   On: 05/06/2023 21:02    Microbiology: Recent Results (from the past 240 hour(s))  Blood culture (routine x 2)     Status: None   Collection Time: 05/24/23  5:15 PM   Specimen: BLOOD  Result Value Ref Range Status   Specimen Description BLOOD LEFT ANTECUBITAL  Final   Special Requests   Final    BOTTLES DRAWN AEROBIC ONLY Blood Culture adequate volume   Culture   Final    NO GROWTH 5 DAYS Performed at [facility], [address], [place], [place] [address]    Report Status 05/29/2023 FINAL  Final  Blood culture (routine x 2)     Status: None   Collection Time: 05/24/23  6:57 PM   Specimen: BLOOD RIGHT HAND  Result Value Ref Range Status   Specimen Description BLOOD RIGHT HAND  Final   Special Requests   Final    BOTTLES DRAWN AEROBIC ONLY Blood Culture results may not be optimal due to an inadequate volume of blood received in culture bottles   Culture   Final    NO GROWTH 5 DAYS Performed at [facility], [address], [place], [place] [address]    Report Status 05/29/2023 FINAL  Final  Aerobic/Anaerobic Culture w Gram Stain (surgical/deep wound)     Status: None   Collection Time: 05/26/23  2:24 PM   Specimen: Abscess  Result Value Ref Range Status   Specimen Description ABSCESS LEFT ARM  Final   Special Requests NONE  Final   Gram Stain   Final    ABUNDANT WBC PRESENT, PREDOMINANTLY PMN NO ORGANISMS SEEN    Culture   Final    No growth aerobically or  anaerobically. Performed at [facility], [address] [place], [place] [address]    Report Status 05/31/2023 FINAL  Final  Fungus Culture With Stain     Status: None (Preliminary result)   Collection Time: 05/26/23  3:14 PM   Specimen: Soft Tissue, Other  Result Value Ref Range Status   Fungus Stain Final report  Final    Comment: (NOTE) Performed At: [facility] [address] [place], [place] [id number] Jolene Schimke MD [id number]    Fungus (Mycology) Culture PENDING  Incomplete   Fungal Source TISSUE  Final    Comment: LEFT ABSCESS FROM ARM Performed at [facility], [address], [place], [place] [address]   Aerobic/Anaerobic Culture w Gram Stain (surgical/deep wound)     Status: None   Collection Time: 05/26/23  3:14 PM   Specimen: Soft Tissue, Other  Result Value Ref Range Status   Specimen Description TISSUE LEFT ARM  Final   Special Requests FROM LEFT ARM ABSCESS  Final   Gram Stain  Final    ABUNDANT WBC PRESENT, PREDOMINANTLY PMN FEW GRAM POSITIVE COCCI IN PAIRS IN CHAINS Gram Stain Report Called to,Read Back By and Verified With: RN Leslye Peer [phone] @ [phone] FH    Culture   Final    RARE STAPHYLOCOCCUS EPIDERMIDIS NO ANAEROBES ISOLATED Performed at [facility], [address], [place], [place] [address]    Report Status 06/02/2023 FINAL  Final   Organism ID, Bacteria STAPHYLOCOCCUS EPIDERMIDIS  Final      Susceptibility   Staphylococcus epidermidis - MIC*    CIPROFLOXACIN <=0.5 SENSITIVE Sensitive     ERYTHROMYCIN <=0.25 SENSITIVE Sensitive     GENTAMICIN <=0.5 SENSITIVE Sensitive     OXACILLIN >=4 RESISTANT Resistant     TETRACYCLINE 2 SENSITIVE Sensitive     VANCOMYCIN 2 SENSITIVE Sensitive     TRIMETH/SULFA <=10 SENSITIVE Sensitive     CLINDAMYCIN <=0.25 SENSITIVE Sensitive     RIFAMPIN <=0.5 SENSITIVE Sensitive     Inducible Clindamycin NEGATIVE Sensitive     * RARE STAPHYLOCOCCUS EPIDERMIDIS  Acid Fast Smear (AFB)      Status: None   Collection Time: 05/26/23  3:14 PM   Specimen: Soft Tissue, Other  Result Value Ref Range Status   AFB Specimen Processing Comment  Final    Comment: Tissue Grinding and Digestion/Decontamination   Acid Fast Smear Negative  Final    Comment: (NOTE) Performed At: [facility] [address] [place], [place] [id number] Jolene Schimke MD [id number]    Source (AFB) TISSUE  Final    Comment: LEFT ABSCESS FROM ARM Performed at [facility], [address], [place], [place] [address]   Fungus Culture Result     Status: None   Collection Time: 05/26/23  3:14 PM  Result Value Ref Range Status   Result 1 Comment  Final    Comment: (NOTE) KOH/Calcofluor preparation:  no fungus observed. Performed At: [facility] [address] [place], [place] [id number] Jolene Schimke MD [id number]:[id number]   MRSA Next Gen by PCR, Nasal     Status: None   Collection Time: 05/30/23  9:03 PM   Specimen: Nasal Mucosa; Nasal Swab  Result Value Ref Range Status   MRSA by PCR Next Gen NOT DETECTED NOT DETECTED Final    Comment: (NOTE) The GeneXpert MRSA Assay (FDA approved for NASAL specimens only), is one component of a comprehensive MRSA colonization surveillance program. It is not intended to diagnose MRSA infection nor to guide or monitor treatment for MRSA infections. Test performance is not FDA approved in patients less than 43 years old. Performed at [facility], [address] [place], [place] [address]      Labs: Basic Metabolic Panel: Recent Labs  Lab 05/28/23 [phone] 05/29/23 0331 05/30/23 0312 05/31/23 0444 06/01/23 0749  NA 132* 130* 132* 131* 134*  K 4.0 4.1 4.0 3.7 4.9  CL 105 100 102 102 103  CO2 20* 23 22 22  21*  GLUCOSE 72 139* 118* 150* 192*  BUN 30* 34* 34* 45* 45*  CREATININE 1.53* 1.38* 1.41* 1.55* 1.38*  CALCIUM 7.8* 7.7* 8.3* 8.3* 8.4*  MG 2.4 2.1 1.9 1.9 2.0   Liver Function Tests: No results for input(s): "AST",  "ALT", "ALKPHOS", "BILITOT", "PROT", "ALBUMIN" in the last 168 hours. No results for input(s): "LIPASE", "AMYLASE" in the last 168 hours. No results for input(s): "AMMONIA" in the last 168 hours. CBC: Recent Labs  Lab 05/28/23 0336 05/29/23 0331 05/30/23 0312 05/31/23 0444 06/01/23  0749  WBC 17.0* 13.0* 10.9* 13.2* 12.2*  HGB 9.7* 9.6* 10.5* 10.3* 9.8*  HCT 28.8* 28.7* 31.5* 30.6* 29.6*  MCV 83.7 83.7 85.6 85.7 84.6  PLT 263 237 255 248 270   Cardiac Enzymes: Recent Labs  Lab 05/28/23 0336  CKTOTAL 19*   BNP: BNP (last 3 results) No results for input(s): "BNP" in the last 8760 hours.  ProBNP (last 3 results) No results for input(s): "PROBNP" in the last 8760 hours.  CBG: Recent Labs  Lab 06/01/23 1125 06/01/23 1610 06/01/23 1934 06/02/23 0814 06/02/23 1146  GLUCAP 170* 207* 269* 170* 142*       Signed:  Silvano Bilis MD.  [facility] 06/02/2023, 12:52 PM

## 2023-06-21 ENCOUNTER — Inpatient Hospital Stay: Payer: Self-pay | Admitting: Infectious Diseases

## 2023-06-21 ENCOUNTER — Telehealth: Payer: Self-pay

## 2023-06-21 NOTE — Telephone Encounter (Signed)
[facility] to see if patient was coming for today's appointment, had to leave a message requesting call back to reschedule.   Patient has a midline and EOT is today 06/21/23.  Sandie Ano, RN

## 2023-06-27 LAB — FUNGAL ORGANISM REFLEX

## 2023-06-27 LAB — FUNGUS CULTURE WITH STAIN

## 2023-06-27 LAB — FUNGUS CULTURE RESULT

## 2023-06-27 NOTE — Telephone Encounter (Signed)
Left voicemail  with [facility] requesting call back to setup follow up appt.  Juanita Laster, RMA

## 2023-07-11 LAB — ACID FAST CULTURE WITH REFLEXED SENSITIVITIES (MYCOBACTERIA): Acid Fast Culture: NEGATIVE

## 2024-03-27 DEATH — deceased
